# Patient Record
Sex: Female | Born: 1980 | Hispanic: No | Marital: Married
Health system: Midwestern US, Community
[De-identification: ages and names within clinical notes are randomized; demographics above are authoritative.]

## PROBLEM LIST (undated history)

## (undated) ENCOUNTER — Inpatient Hospital Stay (HOSPITAL_COMMUNITY): Payer: Self-pay

## (undated) DIAGNOSIS — Z5189 Encounter for other specified aftercare: Secondary | ICD-10-CM

## (undated) DIAGNOSIS — A159 Respiratory tuberculosis unspecified: Secondary | ICD-10-CM

## (undated) DIAGNOSIS — E559 Vitamin D deficiency, unspecified: Principal | ICD-10-CM

## (undated) DIAGNOSIS — R7989 Other specified abnormal findings of blood chemistry: Principal | ICD-10-CM

## (undated) HISTORY — DX: Encounter for other specified aftercare: Z51.89

## (undated) HISTORY — PX: DILATION AND CURETTAGE OF UTERUS: SHX78

## (undated) HISTORY — PX: NOSE SURGERY: SHX723

## (undated) HISTORY — DX: Respiratory tuberculosis unspecified: A15.9

---

## 2008-01-13 ENCOUNTER — Ambulatory Visit: Payer: Self-pay | Admitting: Family Medicine

## 2008-05-23 ENCOUNTER — Ambulatory Visit: Payer: Self-pay | Admitting: Internal Medicine

## 2008-05-23 ENCOUNTER — Encounter: Payer: Self-pay | Admitting: Family Medicine

## 2008-05-23 LAB — CONVERTED CEMR LAB
AST: 18 units/L (ref 0–37)
Albumin: 4.7 g/dL (ref 3.5–5.2)
Alkaline Phosphatase: 51 units/L (ref 39–117)
Basophils Absolute: 0.1 10*3/uL (ref 0.0–0.1)
Basophils Relative: 1 % (ref 0–1)
Calcium: 9.4 mg/dL (ref 8.4–10.5)
Eosinophils Absolute: 0.1 10*3/uL (ref 0.0–0.7)
Eosinophils Relative: 2 % (ref 0–5)
HCT: 40.2 % (ref 36.0–46.0)
Hemoglobin: 13.2 g/dL (ref 12.0–15.0)
MCV: 92.8 fL (ref 78.0–100.0)
Platelets: 261 10*3/uL (ref 150–400)
Potassium: 3.8 meq/L (ref 3.5–5.3)
RBC: 4.33 M/uL (ref 3.87–5.11)
RDW: 14.4 % (ref 11.5–15.5)
Sodium: 142 meq/L (ref 135–145)
Total Bilirubin: 0.3 mg/dL (ref 0.3–1.2)
WBC: 4.9 10*3/uL (ref 4.0–10.5)

## 2008-05-28 ENCOUNTER — Ambulatory Visit: Payer: Self-pay | Admitting: *Deleted

## 2009-08-27 ENCOUNTER — Ambulatory Visit (HOSPITAL_COMMUNITY): Admission: RE | Admit: 2009-08-27 | Discharge: 2009-08-27 | Payer: Self-pay | Admitting: Obstetrics & Gynecology

## 2009-12-20 ENCOUNTER — Inpatient Hospital Stay (HOSPITAL_COMMUNITY): Admission: AD | Admit: 2009-12-20 | Discharge: 2009-12-21 | Payer: Self-pay | Admitting: Obstetrics

## 2009-12-21 ENCOUNTER — Inpatient Hospital Stay (HOSPITAL_COMMUNITY): Admission: AD | Admit: 2009-12-21 | Discharge: 2009-12-24 | Payer: Self-pay | Admitting: Obstetrics

## 2010-11-23 ENCOUNTER — Encounter: Payer: Self-pay | Admitting: Obstetrics & Gynecology

## 2011-01-17 ENCOUNTER — Inpatient Hospital Stay (HOSPITAL_COMMUNITY)
Admission: AD | Admit: 2011-01-17 | Discharge: 2011-01-20 | DRG: 774 | Disposition: A | Discharge status: Home / Self Care | Payer: Medicaid Other | Source: Ambulatory Visit | Attending: Obstetrics and Gynecology | Admitting: Obstetrics and Gynecology

## 2011-01-17 DIAGNOSIS — D689 Coagulation defect, unspecified: Secondary | ICD-10-CM | POA: Diagnosis not present

## 2011-01-17 DIAGNOSIS — O99892 Other specified diseases and conditions complicating childbirth: Secondary | ICD-10-CM | POA: Diagnosis present

## 2011-01-17 DIAGNOSIS — O4290 Premature rupture of membranes, unspecified as to length of time between rupture and onset of labor, unspecified weeks of gestation: Principal | ICD-10-CM | POA: Diagnosis present

## 2011-01-17 DIAGNOSIS — Z22330 Carrier of Group B streptococcus: Secondary | ICD-10-CM

## 2011-01-17 DIAGNOSIS — D696 Thrombocytopenia, unspecified: Secondary | ICD-10-CM | POA: Diagnosis not present

## 2011-01-17 LAB — CBC
MCH: 29.5 pg (ref 26.0–34.0)
MCHC: 33.1 g/dL (ref 30.0–36.0)
MCV: 89.2 fL (ref 78.0–100.0)
RBC: 4.17 MIL/uL (ref 3.87–5.11)

## 2011-01-19 LAB — CBC
RBC: 3.78 MIL/uL — ABNORMAL LOW (ref 3.87–5.11)
WBC: 7.4 10*3/uL (ref 4.0–10.5)

## 2011-01-21 LAB — CBC
HCT: 39.5 % (ref 36.0–46.0)
Hemoglobin: 7.9 g/dL — ABNORMAL LOW (ref 12.0–15.0)
MCHC: 33.3 g/dL (ref 30.0–36.0)
MCV: 90.9 fL (ref 78.0–100.0)
MCV: 91.6 fL (ref 78.0–100.0)
Platelets: 160 10*3/uL (ref 150–400)
RBC: 2.6 MIL/uL — ABNORMAL LOW (ref 3.87–5.11)
RBC: 4.34 MIL/uL (ref 3.87–5.11)
RDW: 15.3 % (ref 11.5–15.5)
WBC: 13.3 10*3/uL — ABNORMAL HIGH (ref 4.0–10.5)

## 2011-01-21 LAB — RPR: RPR Ser Ql: NONREACTIVE

## 2011-01-25 ENCOUNTER — Other Ambulatory Visit: Payer: Self-pay | Admitting: Obstetrics and Gynecology

## 2011-01-25 ENCOUNTER — Ambulatory Visit (HOSPITAL_COMMUNITY): Payer: Medicaid Other

## 2011-01-25 ENCOUNTER — Inpatient Hospital Stay (HOSPITAL_COMMUNITY): Payer: Medicaid Other

## 2011-01-25 ENCOUNTER — Observation Stay (HOSPITAL_COMMUNITY)
Admission: AD | Admit: 2011-01-25 | Discharge: 2011-01-27 | DRG: 769 | Disposition: A | Discharge status: Home / Self Care | Payer: Medicaid Other | Source: Ambulatory Visit | Attending: Obstetrics and Gynecology | Admitting: Obstetrics and Gynecology

## 2011-01-25 DIAGNOSIS — R1031 Right lower quadrant pain: Secondary | ICD-10-CM | POA: Diagnosis present

## 2011-01-25 LAB — CBC
Hemoglobin: 11.8 g/dL — ABNORMAL LOW (ref 12.0–15.0)
MCH: 29.5 pg (ref 26.0–34.0)
MCV: 90.5 fL (ref 78.0–100.0)
Platelets: 179 10*3/uL (ref 150–400)
WBC: 4 10*3/uL (ref 4.0–10.5)

## 2011-01-25 LAB — URINALYSIS, ROUTINE W REFLEX MICROSCOPIC
Glucose, UA: NEGATIVE mg/dL
Ketones, ur: NEGATIVE mg/dL
Nitrite: NEGATIVE
Protein, ur: NEGATIVE mg/dL
Protein, ur: NEGATIVE mg/dL
Specific Gravity, Urine: 1.02 (ref 1.005–1.030)
Urobilinogen, UA: 0.2 mg/dL (ref 0.0–1.0)

## 2011-01-25 LAB — DIFFERENTIAL
Basophils Relative: 0 % (ref 0–1)
Eosinophils Absolute: 0.1 10*3/uL (ref 0.0–0.7)
Lymphs Abs: 1.4 10*3/uL (ref 0.7–4.0)
Monocytes Relative: 5 % (ref 3–12)
Neutro Abs: 5.3 10*3/uL (ref 1.7–7.7)

## 2011-01-25 LAB — URINE MICROSCOPIC-ADD ON

## 2011-01-26 LAB — CBC
Hemoglobin: 7.8 g/dL — ABNORMAL LOW (ref 12.0–15.0)
MCV: 90.2 fL (ref 78.0–100.0)
Platelets: 190 10*3/uL (ref 150–400)
RBC: 2.65 MIL/uL — ABNORMAL LOW (ref 3.87–5.11)
RDW: 14.4 % (ref 11.5–15.5)
WBC: 5.4 10*3/uL (ref 4.0–10.5)

## 2011-01-26 LAB — PREPARE RBC (CROSSMATCH)

## 2011-01-26 LAB — URINE CULTURE: Colony Count: 50000

## 2011-01-27 LAB — CROSSMATCH
Antibody Screen: NEGATIVE
Unit division: 0
Unit division: 0

## 2011-01-27 LAB — CBC
MCV: 88.2 fL (ref 78.0–100.0)
Platelets: 198 10*3/uL (ref 150–400)
RDW: 15.1 % (ref 11.5–15.5)

## 2011-01-29 NOTE — H&P (Signed)
  NAME:  Castaneda, Chelsea         ACCOUNT NO.:  [id number]  MEDICAL RECORD NO.:  [id number]           PATIENT TYPE:  O  LOCATION:  9306                          FACILITY:  [facility]  PHYSICIAN:  Osborn Coho, M.D.   DATE OF BIRTH:  07-Jan-1981  DATE OF ADMISSION:  01/25/2011 DATE OF DISCHARGE:                             HISTORY & PHYSICAL   The patient is a 30 year old gravida 2, para 2, one week status post normal spontaneous vaginal delivery of a term infant with no lacerations, no complications, reporting right lower quadrant pain that was mild to moderate since delivery but over the past 12-24 hours has significantly increased so much so that she has difficulty standing up and walking due to the pain.  She denies fever, chills, dysuria, frequency, heavy vaginal bleeding, malodorous vaginal bleeding.  She does report having difficulty starting a stream of urine, sometimes taking as long as 10 minutes to initiate urination and then taking a long time to empty her bladder.  ALLERGIES:  No known drug allergies.  PAST MEDICAL HISTORY:  No significant medical history.  FAMILY HISTORY:  No significant family history.  ALLERGIES:  No known drug allergies.  No latex allergy.  MEDICATIONS:  Ibuprofen.  OBJECTIVE:  VITAL SIGNS:  Stable, afebrile. GENERAL:  The patient appears mildly uncomfortable at rest, moderately uncomfortable with ambulation especially when bearing weight on her right leg. HEART:  Regular rate and rhythm. LUNGS:  Clear to auscultation bilaterally. ABDOMEN:  Soft, severely tender in the right lower quadrant, mildly tender in the left lower quadrant.  Positive bowel sounds x4. PELVIC:  BUS within normal limits.  No lacerations or hematomas.  Small amount of dark red blood in the vault with a normal odor of lochia. Cervix soft, slightly open.  No cervical motion tenderness or adnexal masses.  Difficult to assess size of uterus secondary to fundal tenderness.   Bladder scanner performed shows 587 mL prior to void.  The patient voided 100.  Postvoid residual was 487.  Foley catheter was placed and additional 100 drained. EXTREMITIES:  Lower extremities show normal strength and sensation bilaterally.  LABS: CBC shows white blood cell count of 7.2, hemoglobin of 11.8, hematocrit of 36.3, platelet count of 244.  No left shift.  Cath UA shows specific gravity of 1.010, moderate hemoglobin.  Microscopic shows rare bacteria. Abdominal ultrasound shows an enlarged postpartum uterus, heterogeneous echogenic endometrial contents with thickened endometrial cavity. Findings suggestive of retained products of conception.  ASSESSMENT: 1. One week status post normal spontaneous vaginal delivery. 2. Right lower quadrant pain, likely due to retained products of     conception and possible endometritis.  PLAN: 1. Discussed risk, benefits, and indications for D and C.  The patient     verbalizes understanding and wishes to proceed. 2. Antibiotics for suspected endometritis.  Dorathy Kinsman, CNM   ______________________________ Osborn Coho, M.D.    VS/MEDQ  D:  01/26/2011  T:  01/26/2011  Job:  [id number]  Electronically Signed by Dorathy Kinsman  on 01/27/2011 03:56:28 AM Electronically Signed by Osborn Coho M.D. on 01/29/2011 09:52:30 AM

## 2011-01-29 NOTE — Op Note (Signed)
  Chelsea Castaneda, Chelsea Castaneda         ACCOUNT NO.:  [id number]  MEDICAL RECORD NO.:  [id number]           PATIENT TYPE:  O  LOCATION:  9306                          [facility]ACILITY:  [facility]  PHYSICIAN:  Osborn Coho, M.D.   DATE OF BIRTH:  23-Feb-1981  DATE OF PROCEDURE:  01/25/2011 DATE OF DISCHARGE:                              OPERATIVE REPORT   PREOPERATIVE DIAGNOSIS:  Retained products of conception.  POSTOPERATIVE DIAGNOSIS:  Retained products of conception.  PROCEDURE:  Suction D and C under ultrasound guidance.  SURGEON:  Osborn Coho, MD  ANESTHESIA:  MAC and local.  FINDINGS:  Moderate POCs.  SPECIMENS TO PATHOLOGY:  POC.  FLUIDS:  2600 mL.  URINE OUTPUT:  200 mL.  ESTIMATED BLOOD LOSS:  800 mL.  COMPLICATIONS:  None.  PROCEDURE IN DETAIL:  The patient was taken to the operating room after risks, benefits, and alternatives discussed with the patient.  The patient verbalized understanding and consent signed and witnessed.  The patient was given a MAC per Anesthesia and prepped and draped in the normal sterile fashion.  A bivalve speculum was placed in the patient's vagina and the anterior lip of the cervix was grasped with a single- tooth tenaculum.  The uterus sounded to about 7 cm and a size 7 suction curette was used.  Suction curettage was performed.  Significant amount of bleeding was noted.  Sharp curettage was performed.  However a gritty texture was unable to be obtained and at that time, decision was made for ultrasound guidance.  There was significant amount of tissue at the fundus primarily in the left aspect of the fundus.  The larger Bier forceps were used to remove the tissue under ultrasound guidance and suction curettage was performed to try to remove any remaining debris and clot.  All instruments were removed.  There was a portion of the case where the tenaculum tore through the anterior lip of the cervix. However, there was no significant bleeding  at this area by the end of the case. Count was correct.  The patient tolerated the procedure well.  Cytotec at the end of the case 1000 mcg was placed in the patient's rectum.  The patient was awaiting return to the recovery room in good condition.     Osborn Coho, M.D.     AR/MEDQ  D:  01/25/2011  T:  01/26/2011  Job:  [id number]  Electronically Signed by Osborn Coho M.D. on 01/29/2011 09:52:14 AM

## 2011-03-12 NOTE — Discharge Summary (Signed)
NAMEJONIKA, Chelsea Castaneda         ACCOUNT NO.:  [id number]  MEDICAL RECORD NO.:  [id number]           PATIENT TYPE:  LOCATION:                                 FACILITY:  PHYSICIAN:  Osborn Coho, M.D.   DATE OF BIRTH:  [birth year]  DATE OF ADMISSION:  01/25/2011 DATE OF DISCHARGE:  01/27/2011                              DISCHARGE SUMMARY   DISCHARGE DIAGNOSIS:  Retained products of conception.  OPERATION:  On the date of admission, the patient underwent a dilatation and evacuation of her uterus because of retained products of conception. The patient tolerated the procedure well.  HISTORY OF PRESENT ILLNESS:  [name] is a 30 year old female para 2-0-0-2 presenting 1 week status post an uncomplicated spontaneous vaginal birth of a term infant complaining of pelvic pain (right greater than left).  She denied any fever, chills, dysuria, frequency, heavy vaginal bleeding, or vaginitis symptoms; however, she was having difficulty starting her stream of urine and ambulating due to her discomfort.  The patient was admitted for further evaluation.  ADMISSION PHYSICAL EXAMINATION:  Her vital signs were stable and she was afebrile.  Abdomen: soft, however, severely tender in the right lower quadrant and mildly so in the left lower quadrant.  The patient's bowel sounds were present in all four quadrants.  Pelvic exam: EG/BUS was normal.  There were no lacerations or hematomas.  There was a small amount of dark red blood in the vaginal vault with normal odor of lochia.  Her cervix was soft and slightly open.  There was no cervical motion tenderness or adnexal masses.  It was difficult to assess the size of the patient's uterus because of fundal tenderness.  A bladder scan showed approximately 587 mL of urine in her bladder prior to her voiding, and the patient was able to avoid 100 mL on her own with a postvoid residual of 487 mL.  A Foley catheter was subsequently placed and  an additional 100 mL of urine was drained.  The patient's extremities, in particular lower extremities, showed normal strength and sensation bilaterally.  The patient's admission CBC were essentially normal as was her urinalysis.  However, an abdominal ultrasound showed an enlarged postpartum uterus with heterogeneous echogenic endometrial contents with thickened endometrial cavity.  These findings were suggestive of retained products of conception.  HOSPITAL COURSE:  On the day of admission, the patient underwent aforementioned procedure, tolerated it well.  In spite of this, however, the patient did lose approximately 800 mL of blood and was transfused 2 units of packed red blood cells.  Subsequently, the patient continued to complain, however, of feeling very lightheaded, weak, and having difficulty ambulating in spite of the fact that her orthostatic blood pressures and pulses were within acceptable range.  The patient consented, therefore, to an additional transfusion of 1 unit of packed red blood cells and  felt much better.  Immediate postop hemoglobin and hematocrit were 8.4/25.9 (preop hemoglobin and hematocrit were 11.8/36.3).  The patient's hemoglobin/hematocrit fell as low as 7.8/23.9, however, following her third transfusion, her hemoglobin and hematocrit were 10.6/32.0.  By hospital day #3, the patient had received the maximum benefit  of her hospital stay, had good analgesia, and was, therefore, deemed ready for discharge home.  DISCHARGE MEDICATIONS:  The patient was directed to her home medication reconciliation form.  DISCHARGE INSTRUCTIONS:  The patient was given a copy of the [facility] D and C/D and E instruction sheet.  FOLLOWUP:  She is to call [facility] to schedule a followup visit in 2 weeks with Dr. Su Hilt.  DISCHARGE INSTRUCTIONS:  The patient was advised to continue the instructions given her following her delivery and to call with  any concerns.     Elmira J. Lowell Guitar, P.A.-C   ______________________________ Osborn Coho, M.D.    EJP/MEDQ  D:  03/03/2011  T:  03/04/2011  Job:  [id number]  Electronically Signed by Raylene Everts. on 03/09/2011 09:07:27 AM Electronically Signed by Osborn Coho M.D. on 03/12/2011 07:39:19 AM

## 2011-11-16 ENCOUNTER — Other Ambulatory Visit: Payer: Self-pay | Admitting: Obstetrics and Gynecology

## 2011-11-16 ENCOUNTER — Other Ambulatory Visit (HOSPITAL_COMMUNITY)
Admission: RE | Admit: 2011-11-16 | Discharge: 2011-11-16 | Disposition: A | Discharge status: Home / Self Care | Payer: BC Managed Care – PPO | Source: Ambulatory Visit | Attending: Obstetrics and Gynecology | Admitting: Obstetrics and Gynecology

## 2011-11-16 DIAGNOSIS — Z1159 Encounter for screening for other viral diseases: Secondary | ICD-10-CM | POA: Insufficient documentation

## 2011-11-16 DIAGNOSIS — Z124 Encounter for screening for malignant neoplasm of cervix: Secondary | ICD-10-CM | POA: Insufficient documentation

## 2011-11-16 DIAGNOSIS — Z113 Encounter for screening for infections with a predominantly sexual mode of transmission: Secondary | ICD-10-CM | POA: Insufficient documentation

## 2013-01-01 ENCOUNTER — Encounter (HOSPITAL_COMMUNITY): Payer: Self-pay | Admitting: *Deleted

## 2013-01-01 ENCOUNTER — Emergency Department (HOSPITAL_COMMUNITY)
Admission: EM | Admit: 2013-01-01 | Discharge: 2013-01-01 | Disposition: A | Discharge status: Home / Self Care | Payer: BC Managed Care – PPO | Attending: Emergency Medicine | Admitting: Emergency Medicine

## 2013-01-01 DIAGNOSIS — IMO0002 Reserved for concepts with insufficient information to code with codable children: Secondary | ICD-10-CM | POA: Insufficient documentation

## 2013-01-01 DIAGNOSIS — Y92410 Unspecified street and highway as the place of occurrence of the external cause: Secondary | ICD-10-CM | POA: Insufficient documentation

## 2013-01-01 DIAGNOSIS — Y9389 Activity, other specified: Secondary | ICD-10-CM | POA: Insufficient documentation

## 2013-01-01 DIAGNOSIS — M7918 Myalgia, other site: Secondary | ICD-10-CM

## 2013-01-01 MED ORDER — HYDROCODONE-ACETAMINOPHEN 5-325 MG PO TABS: ORAL_TABLET | ORAL | Status: DC

## 2013-01-01 MED ORDER — CYCLOBENZAPRINE HCL 10 MG PO TABS: 10.0000 mg | ORAL_TABLET | Freq: Two times a day (BID) | ORAL | Status: DC | PRN

## 2013-01-01 MED ORDER — OXYCODONE-ACETAMINOPHEN 5-325 MG PO TABS
2.0000 | ORAL_TABLET | Freq: Once | ORAL | Status: AC
  Administered 2013-01-01: 2 via ORAL
  Filled 2013-01-01: qty 2

## 2013-01-01 MED ORDER — TRAMADOL HCL 50 MG PO TABS: 50.0000 mg | ORAL_TABLET | Freq: Four times a day (QID) | ORAL | Status: DC | PRN

## 2013-01-01 MED ORDER — GABAPENTIN 100 MG PO CAPS: 100.0000 mg | ORAL_CAPSULE | Freq: Three times a day (TID) | ORAL | Status: DC

## 2013-01-01 MED ORDER — CYCLOBENZAPRINE HCL 10 MG PO TABS
10.0000 mg | ORAL_TABLET | Freq: Once | ORAL | Status: AC
  Administered 2013-01-01: 10 mg via ORAL
  Filled 2013-01-01: qty 1
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
## 2013-01-03 NOTE — ED Provider Notes (Signed)
Medical screening examination/treatment/procedure(s) were performed by non-physician practitioner and as supervising physician I was immediately available for consultation/collaboration.  Martha K Linker, MD 01/03/13 0659 

## 2013-01-26 ENCOUNTER — Ambulatory Visit: Payer: BC Managed Care – PPO

## 2013-01-26 ENCOUNTER — Ambulatory Visit (INDEPENDENT_AMBULATORY_CARE_PROVIDER_SITE_OTHER): Payer: BC Managed Care – PPO | Admitting: Internal Medicine

## 2013-01-26 VITALS — BP 108/72 | HR 74 | Temp 98.4°F | Resp 16 | Ht 63.0 in | Wt 152.0 lb

## 2013-01-26 DIAGNOSIS — R1012 Left upper quadrant pain: Secondary | ICD-10-CM

## 2013-01-26 DIAGNOSIS — N390 Urinary tract infection, site not specified: Secondary | ICD-10-CM

## 2013-01-26 DIAGNOSIS — Z8611 Personal history of tuberculosis: Secondary | ICD-10-CM

## 2013-01-26 DIAGNOSIS — R300 Dysuria: Secondary | ICD-10-CM

## 2013-01-26 DIAGNOSIS — M542 Cervicalgia: Secondary | ICD-10-CM

## 2013-01-26 LAB — POCT UA - MICROSCOPIC ONLY
Casts, Ur, LPF, POC: NEGATIVE
Crystals, Ur, HPF, POC: NEGATIVE
Mucus, UA: POSITIVE
Yeast, UA: POSITIVE

## 2013-01-26 LAB — POCT CBC
Granulocyte percent: 52.3 % (ref 37–80)
HCT, POC: 43.7 % (ref 37.7–47.9)
Hemoglobin: 14.1 g/dL (ref 12.2–16.2)
Lymph, poc: 1.8 (ref 0.6–3.4)
MCH, POC: 30.8 pg (ref 27–31.2)
MCHC: 32.3 g/dL (ref 31.8–35.4)
MCV: 95.5 fL (ref 80–97)
MID (cbc): 0.3 (ref 0–0.9)
MPV: 10.5 fL (ref 0–99.8)
POC Granulocyte: 2.3 (ref 2–6.9)
POC LYMPH PERCENT: 39.8 % (ref 10–50)
POC MID %: 7.9 % (ref 0–12)
Platelet Count, POC: 243 K/uL (ref 142–424)
RBC: 4.58 M/uL (ref 4.04–5.48)
RDW, POC: 14.4 %
WBC: 4.4 K/uL — AB (ref 4.6–10.2)

## 2013-01-26 LAB — POCT SEDIMENTATION RATE: POCT SED RATE: 12 mm/h (ref 0–22)

## 2013-01-26 LAB — POCT URINALYSIS DIPSTICK
Bilirubin, UA: NEGATIVE
Nitrite, UA: NEGATIVE
pH, UA: 5.5

## 2013-01-26 MED ORDER — CIPROFLOXACIN HCL 500 MG PO TABS: 500.0000 mg | ORAL_TABLET | Freq: Two times a day (BID) | ORAL | Status: DC

## 2013-01-26 MED ORDER — IBUPROFEN 600 MG PO TABS: 600.0000 mg | ORAL_TABLET | Freq: Three times a day (TID) | ORAL | Status: DC | PRN

## 2013-01-26 NOTE — Patient Instructions (Signed)
Cervical Sprain A cervical sprain is an injury in the neck in which the ligaments are stretched or torn. The ligaments are the tissues that hold the bones of the neck (vertebrae) in place.Cervical sprains can range from very mild to very severe. Most cervical sprains get better in 1 to 3 weeks, but it depends on the cause and extent of the injury. Severe cervical sprains can cause the neck vertebrae to be unstable. This can lead to damage of the spinal cord and can result in serious nervous system problems. Your caregiver will determine whether your cervical sprain is mild or severe. CAUSES  Severe cervical sprains may be caused by:  Contact sport injuries (football, rugby, wrestling, hockey, auto racing, gymnastics, diving, martial arts, boxing).  Motor vehicle collisions.  Whiplash injuries. This means the neck is forcefully whipped backward and forward.  Falls. Mild cervical sprains may be caused by:   Awkward positions, such as cradling a telephone between your ear and shoulder.  Sitting in a chair that does not offer proper support.  Working at a poorly designed computer station.  Activities that require looking up or down for long periods of time. SYMPTOMS   Pain, soreness, stiffness, or a burning sensation in the front, back, or sides of the neck. This discomfort may develop immediately after injury or it may develop slowly and not begin for 24 hours or more after an injury.  Pain or tenderness directly in the middle of the back of the neck.  Shoulder or upper back pain.  Limited ability to move the neck.  Headache.  Dizziness.  Weakness, numbness, or tingling in the hands or arms.  Muscle spasms.  Difficulty swallowing or chewing.  Tenderness and swelling of the neck. DIAGNOSIS  Most of the time, your caregiver can diagnose this problem by taking your history and doing a physical exam. Your caregiver will ask about any known problems, such as arthritis in the neck  or a previous neck injury. X-rays may be taken to find out if there are any other problems, such as problems with the bones of the neck. However, an X-ray often does not reveal the full extent of a cervical sprain. Other tests such as a computed tomography (CT) scan or magnetic resonance imaging (MRI) may be needed. TREATMENT  Treatment depends on the severity of the cervical sprain. Mild sprains can be treated with rest, keeping the neck in place (immobilization), and pain medicines. Severe cervical sprains need immediate immobilization and an appointment with an orthopedist or neurosurgeon. Several treatment options are available to help with pain, muscle spasms, and other symptoms. Your caregiver may prescribe:  Medicines, such as pain relievers, numbing medicines, or muscle relaxants.  Physical therapy. This can include stretching exercises, strengthening exercises, and posture training. Exercises and improved posture can help stabilize the neck, strengthen muscles, and help stop symptoms from returning.  A neck collar to be worn for short periods of time. Often, these collars are worn for comfort. However, certain collars may be worn to protect the neck and prevent further worsening of a serious cervical sprain. HOME CARE INSTRUCTIONS   Put ice on the injured area.  Put ice in a plastic bag.  Place a towel between your skin and the bag.  Leave the ice on for 15 to 20 minutes, 3 to 4 times a day.  Only take over-the-counter or prescription medicines for pain, discomfort, or fever as directed by your caregiver.  Keep all follow-up appointments as directed by your   caregiver.  Keep all physical therapy appointments as directed by your caregiver.  If a neck collar is prescribed, wear it as directed by your caregiver.  Do not drive while wearing a neck collar.  Make any needed adjustments to your work station to promote good posture.  Avoid positions and activities that make your  symptoms worse.  Warm up and stretch before being active to help prevent problems. SEEK MEDICAL CARE IF:   Your pain is not controlled with medicine.  You are unable to decrease your pain medicine over time as planned.  Your activity level is not improving as expected. SEEK IMMEDIATE MEDICAL CARE IF:   You develop any bleeding, stomach upset, or signs of an allergic reaction to your medicine.  Your symptoms get worse.  You develop new, unexplained symptoms.  You have numbness, tingling, weakness, or paralysis in any part of your body. MAKE SURE YOU:   Understand these instructions.  Will watch your condition.  Will get help right away if you are not doing well or get worse. Document Released: 08/16/2007 Document Revised: 01/11/2012 Document Reviewed: 07/22/2011 [facility] Patient Information 2013 [place], [place]. Urinary Tract Infection Urinary tract infections (UTIs) can develop anywhere along your urinary tract. Your urinary tract is your body's drainage system for removing wastes and extra water. Your urinary tract includes two kidneys, two ureters, a bladder, and a urethra. Your kidneys are a pair of bean-shaped organs. Each kidney is about the size of your fist. They are located below your ribs, one on each side of your spine. CAUSES Infections are caused by microbes, which are microscopic organisms, including fungi, viruses, and bacteria. These organisms are so small that they can only be seen through a microscope. Bacteria are the microbes that most commonly cause UTIs. SYMPTOMS  Symptoms of UTIs may vary by age and gender of the patient and by the location of the infection. Symptoms in young women typically include a frequent and intense urge to urinate and a painful, burning feeling in the bladder or urethra during urination. Older women and men are more likely to be tired, shaky, and weak and have muscle aches and abdominal pain. A fever may mean the infection is in your  kidneys. Other symptoms of a kidney infection include pain in your back or sides below the ribs, nausea, and vomiting. DIAGNOSIS To diagnose a UTI, your caregiver will ask you about your symptoms. Your caregiver also will ask to provide a urine sample. The urine sample will be tested for bacteria and white blood cells. White blood cells are made by your body to help fight infection. TREATMENT  Typically, UTIs can be treated with medication. Because most UTIs are caused by a bacterial infection, they usually can be treated with the use of antibiotics. The choice of antibiotic and length of treatment depend on your symptoms and the type of bacteria causing your infection. HOME CARE INSTRUCTIONS  If you were prescribed antibiotics, take them exactly as your caregiver instructs you. Finish the medication even if you feel better after you have only taken some of the medication.  Drink enough water and fluids to keep your urine clear or pale yellow.  Avoid caffeine, tea, and carbonated beverages. They tend to irritate your bladder.  Empty your bladder often. Avoid holding urine for long periods of time.  Empty your bladder before and after sexual intercourse.  After a bowel movement, women should cleanse from front to back. Use each tissue only once. SEEK MEDICAL CARE IF:  You have back pain.  You develop a fever.  Your symptoms do not begin to resolve within 3 days. SEEK IMMEDIATE MEDICAL CARE IF:   You have severe back pain or lower abdominal pain.  You develop chills.  You have nausea or vomiting.  You have continued burning or discomfort with urination. MAKE SURE YOU:   Understand these instructions.  Will watch your condition.  Will get help right away if you are not doing well or get worse. Document Released: 07/29/2005 Document Revised: 04/19/2012 Document Reviewed: 11/27/2011 [facility] Patient Information 2013 [place], [place].

## 2013-01-26 NOTE — Progress Notes (Signed)
Subjective:    Patient ID: Chelsea Castaneda, female    DOB: 1981/05/24, 32 y.o.   MRN: [id number]  HPI CO 5d of anterior and posterior neck pain, no throat pain, nofever or resp sxs, no cough or chest pain , but does have left quad abdom pain. No diarrhea, constipation, nausea. No fatigue,Malaise. No dysuria, hematuria. No HA, dizzy,radiation of pain  Review of Systems Generally healthy from [facility], house wife    Objective:   Physical Exam  Vitals reviewed. Constitutional: She is oriented to person, place, and time. She appears well-developed and well-nourished.  HENT:  Right Ear: External ear normal.  Left Ear: External ear normal.  Nose: Nose normal.  Mouth/Throat: Oropharynx is clear and moist.  Eyes: EOM are normal. No scleral icterus.  Neck: No tracheal deviation present. No thyromegaly present.  Cardiovascular: Normal rate, regular rhythm and normal heart sounds.   Pulmonary/Chest: Effort normal and breath sounds normal.  Abdominal: Soft. Bowel sounds are normal. She exhibits no mass. There is tenderness. There is no rebound and no guarding.  Musculoskeletal: She exhibits tenderness.       Cervical back: She exhibits decreased range of motion, tenderness and pain. She exhibits no bony tenderness, no swelling, no edema, no deformity, no laceration, no spasm and normal pulse.       Back:  Lymphadenopathy:    She has no cervical adenopathy.  Neurological: She is alert and oriented to person, place, and time. She has normal strength. No cranial nerve deficit or sensory deficit. She exhibits normal muscle tone. She displays a negative Romberg sign. Coordination and gait normal.  Skin: Skin is warm, dry and intact. No erythema.  Tender thyroid Painfull to move neck/ no nuchal signs because can passively move neck and no HA  Results for orders placed in visit on 01/26/13  POCT UA - MICROSCOPIC ONLY      Result Value Range   WBC, Ur, HPF, POC 3-5     RBC, urine, microscopic  2-3     Bacteria, U Microscopic 1+     Mucus, UA pos     Epithelial cells, urine per micros 2-3     Crystals, Ur, HPF, POC neg     Casts, Ur, LPF, POC neg     Yeast, UA pos    POCT URINALYSIS DIPSTICK      Result Value Range   Color, UA yellow     Clarity, UA clear     Glucose, UA neg     Bilirubin, UA neg     Ketones, UA neg     Spec Grav, UA 1.025     Blood, UA trace     pH, UA 5.5     Protein, UA neg     Urobilinogen, UA 0.2     Nitrite, UA neg     Leukocytes, UA Trace    POCT CBC      Result Value Range   WBC 4.4 (*) 4.6 - 10.2 K/uL   Lymph, poc 1.8  0.6 - 3.4   POC LYMPH PERCENT 39.8  10 - 50 %L   MID (cbc) 0.3  0 - 0.9   POC MID % 7.9  0 - 12 %M   POC Granulocyte 2.3  2 - 6.9   Granulocyte percent 52.3  37 - 80 %G   RBC 4.58  4.04 - 5.48 M/uL   Hemoglobin 14.1  12.2 - 16.2 g/dL   HCT, POC 40.9  81.1 - 47.9 %  MCV 95.5  80 - 97 fL   MCH, POC 30.8  27 - 31.2 pg   MCHC 32.3  31.8 - 35.4 g/dL   RDW, POC 16.1     Platelet Count, POC 243  142 - 424 K/uL   MPV 10.5  0 - 99.8 fL    Cs urine     Assessment & Plan:  UTI likely Neck pain cause unclear Cipro/Motrin Close f/up

## 2013-01-27 NOTE — Progress Notes (Signed)
  Subjective:    Patient ID: Chelsea Castaneda, female    DOB: April 22, 1981, 32 y.o.   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 Has neck and upper thorax pain today. CXR normal        Assessment & Plan:

## 2013-01-28 LAB — URINE CULTURE: Colony Count: 30000

## 2013-02-16 ENCOUNTER — Other Ambulatory Visit: Payer: Self-pay | Admitting: Obstetrics and Gynecology

## 2013-02-16 ENCOUNTER — Other Ambulatory Visit (HOSPITAL_COMMUNITY)
Admission: RE | Admit: 2013-02-16 | Discharge: 2013-02-16 | Disposition: A | Discharge status: Home / Self Care | Payer: BC Managed Care – PPO | Source: Ambulatory Visit | Attending: Obstetrics and Gynecology | Admitting: Obstetrics and Gynecology

## 2013-02-16 DIAGNOSIS — Z01419 Encounter for gynecological examination (general) (routine) without abnormal findings: Secondary | ICD-10-CM | POA: Insufficient documentation

## 2013-05-03 ENCOUNTER — Emergency Department (HOSPITAL_COMMUNITY)
Admission: EM | Admit: 2013-05-03 | Discharge: 2013-05-03 | Disposition: A | Discharge status: Home / Self Care | Payer: BC Managed Care – PPO | Attending: Emergency Medicine | Admitting: Emergency Medicine

## 2013-05-03 ENCOUNTER — Encounter (HOSPITAL_COMMUNITY): Payer: Self-pay | Admitting: Emergency Medicine

## 2013-05-03 DIAGNOSIS — R5381 Other malaise: Secondary | ICD-10-CM | POA: Insufficient documentation

## 2013-05-03 DIAGNOSIS — Z3490 Encounter for supervision of normal pregnancy, unspecified, unspecified trimester: Secondary | ICD-10-CM

## 2013-05-03 DIAGNOSIS — Z8611 Personal history of tuberculosis: Secondary | ICD-10-CM | POA: Insufficient documentation

## 2013-05-03 DIAGNOSIS — F329 Major depressive disorder, single episode, unspecified: Secondary | ICD-10-CM | POA: Insufficient documentation

## 2013-05-03 DIAGNOSIS — O9989 Other specified diseases and conditions complicating pregnancy, childbirth and the puerperium: Secondary | ICD-10-CM | POA: Insufficient documentation

## 2013-05-03 DIAGNOSIS — R0602 Shortness of breath: Secondary | ICD-10-CM | POA: Insufficient documentation

## 2013-05-03 DIAGNOSIS — F3289 Other specified depressive episodes: Secondary | ICD-10-CM | POA: Insufficient documentation

## 2013-05-03 DIAGNOSIS — Z3201 Encounter for pregnancy test, result positive: Secondary | ICD-10-CM | POA: Insufficient documentation

## 2013-05-03 DIAGNOSIS — R531 Weakness: Secondary | ICD-10-CM

## 2013-05-03 LAB — POCT PREGNANCY, URINE: Preg Test, Ur: POSITIVE — AB

## 2013-05-03 LAB — CBC WITH DIFFERENTIAL/PLATELET
Basophils Absolute: 0 10*3/uL (ref 0.0–0.1)
Basophils Relative: 1 % (ref 0–1)
Eosinophils Relative: 2 % (ref 0–5)
HCT: 36.9 % (ref 36.0–46.0)
MCHC: 34.1 g/dL (ref 30.0–36.0)
MCV: 90.2 fL (ref 78.0–100.0)
Monocytes Absolute: 0.5 10*3/uL (ref 0.1–1.0)
RDW: 13.1 % (ref 11.5–15.5)

## 2013-05-03 LAB — URINALYSIS, ROUTINE W REFLEX MICROSCOPIC
Hgb urine dipstick: NEGATIVE
Specific Gravity, Urine: 1.031 — ABNORMAL HIGH (ref 1.005–1.030)
Urobilinogen, UA: 0.2 mg/dL (ref 0.0–1.0)

## 2013-05-03 LAB — COMPREHENSIVE METABOLIC PANEL
AST: 13 U/L (ref 0–37)
Albumin: 3.7 g/dL (ref 3.5–5.2)
CO2: 25 mEq/L (ref 19–32)
Calcium: 9.7 mg/dL (ref 8.4–10.5)
Creatinine, Ser: 0.61 mg/dL (ref 0.50–1.10)
GFR calc non Af Amer: >90 mL/min (ref >90)

## 2013-05-03 LAB — POCT I-STAT TROPONIN I: Troponin i, poc: 0.01 ng/mL (ref 0.00–0.08)

## 2013-05-03 LAB — URINE MICROSCOPIC-ADD ON

## 2013-05-03 NOTE — ED Notes (Signed)
Per pt, states these symptoms started once she got pregnant-feels sad, unable to get out of bed-did not feel this way with other pregnancies-no history of post partum depression

## 2013-05-03 NOTE — ED Provider Notes (Signed)
History    CSN: [id number] Arrival date & time 05/03/13  1256  First MD Initiated Contact with Patient 05/03/13 1436     Chief Complaint  Patient presents with  . Shortness of Breath   (Consider location/radiation/quality/duration/timing/severity/associated sxs/prior Treatment) HPI  32 year old female G3P2 who is currently [redacted] weeks pregnant presents complaining of feeling tired and depressed. Patient reports for the past 3 weeks she has sensation of fatigue, dizzy, occasional shortness of breath, lack of energy to do anything. 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 She wants to return to [facility] be with family. This morning she felt as if her heart is going to stop because it was beating so slow, thus prompted her to come to the ER for further evaluation. Patient denies fever, chills, headache, chest pain, abdominal pain, back pain, nausea, vomiting, diarrhea, dysuria, or rash. She has no suicidal or homicidal ideation. She does not self medicate with anything. She has not had recent symptoms from prior pregnancy. This is an expected pregnancy. She has no cardiac history. She does not have a new OB/GYN. Patient currently has no complaint of any associated pain. Denies sneezing, cough, sore throat, all of hemoptysis. No prior history of PE or DVT.  No complaint of shortness of breath at this time. She is a nonsmoker.   Past Medical History  Diagnosis Date  . Tuberculosis     inactive   History reviewed. No pertinent past surgical history. Family History  Problem Relation Age of Onset  . Diabetes Mother   . Hypertension Mother    History  Substance Use Topics  . Smoking status: Never Smoker   . Smokeless tobacco: Never Used  . Alcohol Use: No   OB History   Grav Para Term Preterm Abortions TAB SAB Ect Mult Living   1              Review of Systems  All other systems reviewed and are negative.    Allergies  Review of  patient's allergies indicates no known allergies.  Home Medications   Current Outpatient Rx  Name  Route  Sig  Dispense  Refill  . Prenatal Vit-Fe Fumarate-FA (PRENATAL MULTIVITAMIN) TABS   Oral   Take 1 tablet by mouth every morning.          BP 133/90  Pulse 84  Temp(Src) 98.6 F (37 C) (Oral)  Resp 18  SpO2 100% Physical Exam  Nursing note and vitals reviewed. Constitutional: She is oriented to person, place, and time. She appears well-developed and well-nourished. No distress.  Awake, alert, nontoxic appearance  HENT:  Head: Atraumatic.  Mouth/Throat: Oropharynx is clear and moist.  Eyes: Conjunctivae are normal. Right eye exhibits no discharge. Left eye exhibits no discharge.  Neck: Neck supple.  Cardiovascular: Normal rate and regular rhythm.   Pulmonary/Chest: Effort normal. No respiratory distress. She exhibits no tenderness.  Abdominal: Soft. There is no tenderness. There is no rebound.  Musculoskeletal: She exhibits no tenderness.  ROM appears intact, no obvious focal weakness  Neurological: She is alert and oriented to person, place, and time. She has normal strength. No cranial nerve deficit or sensory deficit. GCS eye subscore is 4. GCS verbal subscore is 5. GCS motor subscore is 6.  Mental status and motor strength appears intact  5 out of 5 strength to all 4 extremities. Normal gait.  No pedal edema.  Skin: No rash noted.  Psychiatric: She  has a normal mood and affect.    ED Course  Procedures (including critical care time)   Date: 05/03/2013  Rate: 77  Rhythm: normal sinus rhythm  QRS Axis: normal  Intervals: normal  ST/T Wave abnormalities: normal  Conduction Disutrbances: none  Narrative Interpretation:   Old EKG Reviewed: No prior for comparison.     2:51 PM Pt has sxs suggestive of partum depression.  NO SI/HI.  ECG unremarkable.  No abdominal pain.  Labs are otherwise unremarkable.  Doubt UTI.  Able to ambulate without difficulty.  No  focal neuro deficits.  Care discussed with attending.  Plan to have pt f/u with OB/GYN for further care.  Reassurance given.  Return precaution discussed.    Labs Reviewed  COMPREHENSIVE METABOLIC PANEL - Abnormal; Notable for the following:    Total Bilirubin 0.2 (*)    All other components within normal limits  URINALYSIS, ROUTINE W REFLEX MICROSCOPIC - Abnormal; Notable for the following:    APPearance CLOUDY (*)    Specific Gravity, Urine 1.031 (*)    Leukocytes, UA MODERATE (*)    All other components within normal limits  URINE MICROSCOPIC-ADD ON - Abnormal; Notable for the following:    Squamous Epithelial / LPF FEW (*)    Bacteria, UA FEW (*)    All other components within normal limits  POCT PREGNANCY, URINE - Abnormal; Notable for the following:    Preg Test, Ur POSITIVE (*)    All other components within normal limits  URINE CULTURE  CBC WITH DIFFERENTIAL  POCT I-STAT TROPONIN I   No results found. 1. Weakness generalized   2. Pregnancy     MDM  BP 133/90  Pulse 84  Temp(Src) 98.6 F (37 C) (Oral)  Resp 18  SpO2 100%  I have reviewed nursing notes and vital signs.  I reviewed available ER/hospitalization records thought the EMR   Fayrene Helper, [place] 05/03/13 1544

## 2013-05-03 NOTE — ED Notes (Signed)
Per pt, 4-[redacted] weeks pregnant-has been short of breath, weak, dizzy on and off for 3 weeks-worse today-states feels like her heart is beating slow and feels like it is going to stop

## 2013-05-03 NOTE — ED Provider Notes (Signed)
Medical screening examination/treatment/procedure(s) were performed by non-physician practitioner and as supervising physician I was immediately available for consultation/collaboration.  Ethelda Chick, MD 05/03/13 [phone]

## 2013-05-04 LAB — URINE CULTURE: Colony Count: 60000

## 2013-05-07 ENCOUNTER — Inpatient Hospital Stay (HOSPITAL_COMMUNITY): Payer: BC Managed Care – PPO

## 2013-05-07 ENCOUNTER — Encounter (HOSPITAL_COMMUNITY): Payer: Self-pay | Admitting: *Deleted

## 2013-05-07 ENCOUNTER — Inpatient Hospital Stay (HOSPITAL_COMMUNITY)
Admission: AD | Admit: 2013-05-07 | Discharge: 2013-05-07 | Disposition: A | Discharge status: Home / Self Care | Payer: BC Managed Care – PPO | Source: Ambulatory Visit | Attending: Obstetrics & Gynecology | Admitting: Obstetrics & Gynecology

## 2013-05-07 DIAGNOSIS — O219 Vomiting of pregnancy, unspecified: Secondary | ICD-10-CM

## 2013-05-07 DIAGNOSIS — N390 Urinary tract infection, site not specified: Secondary | ICD-10-CM | POA: Insufficient documentation

## 2013-05-07 DIAGNOSIS — O2390 Unspecified genitourinary tract infection in pregnancy, unspecified trimester: Secondary | ICD-10-CM | POA: Insufficient documentation

## 2013-05-07 DIAGNOSIS — O210 Mild hyperemesis gravidarum: Secondary | ICD-10-CM | POA: Insufficient documentation

## 2013-05-07 LAB — CBC
HCT: 36.4 % (ref 36.0–46.0)
Hemoglobin: 12.5 g/dL (ref 12.0–15.0)
MCH: 30.6 pg (ref 26.0–34.0)
MCHC: 34.3 g/dL (ref 30.0–36.0)
MCV: 89 fL (ref 78.0–100.0)
Platelets: 201 10*3/uL (ref 150–400)
RBC: 4.09 MIL/uL (ref 3.87–5.11)
RDW: 13.2 % (ref 11.5–15.5)
WBC: 7.2 10*3/uL (ref 4.0–10.5)

## 2013-05-07 LAB — COMPREHENSIVE METABOLIC PANEL
ALT: 10 U/L (ref 0–35)
AST: 13 U/L (ref 0–37)
Albumin: 3.7 g/dL (ref 3.5–5.2)
Alkaline Phosphatase: 44 U/L (ref 39–117)
BUN: 10 mg/dL (ref 6–23)
CO2: 26 mEq/L (ref 19–32)
Calcium: 9.6 mg/dL (ref 8.4–10.5)
Chloride: 99 mEq/L (ref 96–112)
Creatinine, Ser: 0.61 mg/dL (ref 0.50–1.10)
GFR calc Af Amer: >90 mL/min (ref >90)
GFR calc non Af Amer: >90 mL/min (ref >90)
Glucose, Bld: 99 mg/dL (ref 70–99)
Potassium: 4 mEq/L (ref 3.5–5.1)
Sodium: 134 mEq/L — ABNORMAL LOW (ref 135–145)
Total Bilirubin: 0.1 mg/dL — ABNORMAL LOW (ref 0.3–1.2)
Total Protein: 7.2 g/dL (ref 6.0–8.3)

## 2013-05-07 LAB — URINALYSIS, ROUTINE W REFLEX MICROSCOPIC
Bilirubin Urine: NEGATIVE
Glucose, UA: NEGATIVE mg/dL
Hgb urine dipstick: NEGATIVE
Ketones, ur: 15 mg/dL — AB
Nitrite: NEGATIVE
Protein, ur: NEGATIVE mg/dL
Specific Gravity, Urine: >1.03 — ABNORMAL HIGH (ref 1.005–1.030)
Urobilinogen, UA: 0.2 mg/dL (ref 0.0–1.0)
pH: 6 (ref 5.0–8.0)

## 2013-05-07 LAB — WET PREP, GENITAL
Clue Cells Wet Prep HPF POC: NONE SEEN
Trich, Wet Prep: NONE SEEN
Yeast Wet Prep HPF POC: NONE SEEN

## 2013-05-07 LAB — HCG, QUANTITATIVE, PREGNANCY: hCG, Beta Chain, Quant, S: 71268 m[IU]/mL — ABNORMAL HIGH (ref <5)

## 2013-05-07 LAB — URINE MICROSCOPIC-ADD ON

## 2013-05-07 LAB — OB RESULTS CONSOLE GC/CHLAMYDIA
CHLAMYDIA, DNA PROBE: NEGATIVE
Gonorrhea: NEGATIVE

## 2013-05-07 MED ORDER — M.V.I. ADULT IV INJ
Freq: Once | INTRAVENOUS | Status: AC
  Administered 2013-05-07: 19:00:00 via INTRAVENOUS
  Filled 2013-05-07: qty 1000

## 2013-05-07 MED ORDER — CEPHALEXIN 500 MG PO CAPS: 500.0000 mg | ORAL_CAPSULE | Freq: Three times a day (TID) | ORAL | Status: DC

## 2013-05-07 MED ORDER — DOXYLAMINE-PYRIDOXINE 10-10 MG PO TBEC: 1.0000 | DELAYED_RELEASE_TABLET | ORAL | Status: DC

## 2013-05-07 NOTE — MAU Provider Note (Signed)
History     CSN: [id number]  Arrival date and time: 05/07/13 1538   First Provider Initiated Contact with Patient 05/07/13 1601      Chief Complaint  Patient presents with  . Nausea  . Shortness of Breath  . Abdominal Cramping   HPI Chelsea Castaneda is a 32 y.o. G3P2002 at [redacted]w[redacted]d. She presents with not feeling well. She is very tired and nauseated, has not eaten a meal in 4 wks, only drinks water, had tea this am. She can't even go outside with her children to play. She denies any change in discharge or bleeding, has low abd pain- cramping- before having BM's. She feels like her heart is slower than usual. She had ED visit 7/2,   CBC, C Met and Troponin were nl. OB History   Grav Para Term Preterm Abortions TAB SAB Ect Mult Living   3 2 2       2       Past Medical History  Diagnosis Date  . Tuberculosis     inactive    Past Surgical History  Procedure Laterality Date  . Cervical cerclage      Family History  Problem Relation Age of Onset  . Diabetes Mother   . Hypertension Mother     History  Substance Use Topics  . Smoking status: Never Smoker   . Smokeless tobacco: Never Used  . Alcohol Use: No    Allergies: No Known Allergies  Prescriptions prior to admission  Medication Sig Dispense Refill  . Prenatal Vit-Fe Fumarate-FA (PRENATAL MULTIVITAMIN) TABS Take 1 tablet by mouth every morning.        ROS Physical Exam   Blood pressure 112/69, pulse 85, temperature 97.2 F (36.2 C), temperature source Axillary, resp. rate 16, height 5\' 6"  (1.676 m), weight 172 lb (78.019 kg), SpO2 100.00%.  Physical Exam  Constitutional: She is oriented to person, place, and time. She appears well-developed and well-nourished.  GI: Soft. There is no tenderness.  Musculoskeletal: Normal range of motion.  Neurological: She is alert and oriented to person, place, and time.  Skin: Skin is warm and dry.  Psychiatric: She has a normal mood and affect. Her behavior is normal.     MAU Course  Procedures  MDM Results for orders placed during the hospital encounter of 05/07/13 (from the past 24 hour(s))  URINALYSIS, ROUTINE W REFLEX MICROSCOPIC     Status: Abnormal   Collection Time    05/07/13  4:30 PM      Result Value Range   Color, Urine YELLOW  YELLOW   APPearance HAZY (*) CLEAR   Specific Gravity, Urine >1.030 (*) 1.005 - 1.030   pH 6.0  5.0 - 8.0   Glucose, UA NEGATIVE  NEGATIVE mg/dL   Hgb urine dipstick NEGATIVE  NEGATIVE   Bilirubin Urine NEGATIVE  NEGATIVE   Ketones, ur 15 (*) NEGATIVE mg/dL   Protein, ur NEGATIVE  NEGATIVE mg/dL   Urobilinogen, UA 0.2  0.0 - 1.0 mg/dL   Nitrite NEGATIVE  NEGATIVE   Leukocytes, UA TRACE (*) NEGATIVE  URINE MICROSCOPIC-ADD ON     Status: Abnormal   Collection Time    05/07/13  4:30 PM      Result Value Range   Squamous Epithelial / LPF MANY (*) RARE   WBC, UA 3-6  <3 WBC/hpf   RBC / HPF 0-2  <3 RBC/hpf   Bacteria, UA MANY (*) RARE   Crystals CA OXALATE CRYSTALS (*) NEGATIVE  CBC  Status: None   Collection Time    05/07/13  4:45 PM      Result Value Range   WBC 7.2  4.0 - 10.5 K/uL   RBC 4.09  3.87 - 5.11 MIL/uL   Hemoglobin 12.5  12.0 - 15.0 g/dL   HCT 91.4  78.2 - 95.6 %   MCV 89.0  78.0 - 100.0 fL   MCH 30.6  26.0 - 34.0 pg   MCHC 34.3  30.0 - 36.0 g/dL   RDW 21.3  08.6 - 57.8 %   Platelets 201  150 - 400 K/uL  COMPREHENSIVE METABOLIC PANEL     Status: Abnormal   Collection Time    05/07/13  4:45 PM      Result Value Range   Sodium 134 (*) 135 - 145 mEq/L   Potassium 4.0  3.5 - 5.1 mEq/L   Chloride 99  96 - 112 mEq/L   CO2 26  19 - 32 mEq/L   Glucose, Bld 99  70 - 99 mg/dL   BUN 10  6 - 23 mg/dL   Creatinine, Ser 4.69  0.50 - 1.10 mg/dL   Calcium 9.6  8.4 - 62.9 mg/dL   Total Protein 7.2  6.0 - 8.3 g/dL   Albumin 3.7  3.5 - 5.2 g/dL   AST 13  0 - 37 U/L   ALT 10  0 - 35 U/L   Alkaline Phosphatase 44  39 - 117 U/L   Total Bilirubin 0.1 (*) 0.3 - 1.2 mg/dL   GFR calc non Af Amer >90   >90 mL/min   GFR calc Af Amer >90  >90 mL/min  HCG, QUANTITATIVE, PREGNANCY     Status: Abnormal   Collection Time    05/07/13  4:45 PM      Result Value Range   hCG, Beta Chain, Quant, [place] [address] (*) <5 mIU/mL  WET PREP, GENITAL     Status: Abnormal   Collection Time    05/07/13  6:10 PM      Result Value Range   Yeast Wet Prep HPF POC NONE SEEN  NONE SEEN   Trich, Wet Prep NONE SEEN  NONE SEEN   Clue Cells Wet Prep HPF POC NONE SEEN  NONE SEEN   WBC, Wet Prep HPF POC MANY (*) NONE SEEN   US Ob Comp Less 14 Wks  05/07/2013   *RADIOLOGY REPORT*  Clinical Data: abd pain @ 7 wks; ;  OBSTETRIC <14 WK [place] AND TRANSVAGINAL OB US  Technique: Both transabdominal and transvaginal ultrasound examinations were performed for complete evaluation of the gestation as well as the maternal uterus, adnexal regions, and pelvic cul-de-sac.  Comparison: None.  Findings: Single intrauterine gestation with a crown-rump length of 7.7 mm for estimated gestational age of [redacted] weeks 5 days.  Fetal heart rate 104 beats per minute.  No subchorionic hemorrhage.  Ovaries are symmetric in size and echotexture.  No adnexal masses. No free fluid.  IMPRESSION: 6-week-5-day intrauterine pregnancy with a fetal heart rate of 104 beats per minute.   Original Report Authenticated By: Charlett Nose, M.D.     9:15 pm Pt feeling much better after bag of fluids with MV, smiling Discussed lab results- Rx Keflex for UTI Management of N&V reviewed, Rx for Diclegis  Pt has an appt next week for [facility]  Assessment and Plan  ASSESSMENT:  6 5/7 wks IUP UTI Nausea of pregnancy  PLAN:  Keflex 500 TID x 7d Diclegis for N&V  Blayton Huttner, Olegario Messier M. 05/07/2013, 4:15 PM

## 2013-05-07 NOTE — MAU Note (Signed)
C/o SOB but O2 sat is 100%; c/o nausea for past month but no vomiting; c/o being dizzy; G3P2;

## 2013-05-07 NOTE — MAU Note (Signed)
L flank and chest discomfort feels like "something moving up and down inside"; feels like my "heart is speeding-up and stopping";

## 2013-05-07 NOTE — MAU Note (Signed)
Anxious; husband with pt; Husband is very anxious also;

## 2013-05-08 LAB — URINE CULTURE
Colony Count: NO GROWTH
Culture: NO GROWTH

## 2013-05-08 LAB — GC/CHLAMYDIA PROBE AMP
CT Probe RNA: NEGATIVE
GC Probe RNA: NEGATIVE

## 2013-05-08 NOTE — MAU Provider Note (Signed)
Attestation of Attending Supervision of Advanced Practitioner (CNM/NP): Evaluation and management procedures were performed by the Advanced Practitioner under my supervision and collaboration. I have reviewed the Advanced Practitioner's note and chart, and I agree with the management and plan.  Shamika Pedregon H. 5:18 AM

## 2013-06-12 ENCOUNTER — Encounter: Payer: BC Managed Care – PPO | Admitting: Obstetrics & Gynecology

## 2013-10-19 ENCOUNTER — Ambulatory Visit (INDEPENDENT_AMBULATORY_CARE_PROVIDER_SITE_OTHER): Payer: BC Managed Care – PPO | Admitting: Obstetrics & Gynecology

## 2013-10-19 ENCOUNTER — Encounter: Payer: Self-pay | Admitting: Obstetrics & Gynecology

## 2013-10-19 VITALS — BP 115/72 | Temp 98.0°F | Wt 169.0 lb

## 2013-10-19 DIAGNOSIS — O0930 Supervision of pregnancy with insufficient antenatal care, unspecified trimester: Secondary | ICD-10-CM

## 2013-10-19 DIAGNOSIS — Z3483 Encounter for supervision of other normal pregnancy, third trimester: Secondary | ICD-10-CM

## 2013-10-19 DIAGNOSIS — N760 Acute vaginitis: Secondary | ICD-10-CM

## 2013-10-19 DIAGNOSIS — Z3201 Encounter for pregnancy test, result positive: Secondary | ICD-10-CM

## 2013-10-19 DIAGNOSIS — Z3480 Encounter for supervision of other normal pregnancy, unspecified trimester: Secondary | ICD-10-CM

## 2013-10-19 MED ORDER — AZITHROMYCIN 250 MG PO TABS: 250.0000 mg | ORAL_TABLET | Freq: Once | ORAL | Status: DC

## 2013-10-19 NOTE — Patient Instructions (Addendum)
Please remember to pick up (azithromycin) antibiotic from your pharmacy  Third Trimester of Pregnancy The third trimester is from week 29 through week 42, months 7 through 9. The third trimester is a time when the fetus is growing rapidly. At the end of the ninth month, the fetus is about 20 inches in length and weighs 6 10 pounds.  BODY CHANGES Your body goes through many changes during pregnancy. The changes vary from woman to woman.   Your weight will continue to increase. You can expect to gain 25 35 pounds (11 16 kg) by the end of the pregnancy.  You may begin to get stretch marks on your hips, abdomen, and breasts.  You may urinate more often because the fetus is moving lower into your pelvis and pressing on your bladder.  You may develop or continue to have heartburn as a result of your pregnancy.  You may develop constipation because certain hormones are causing the muscles that push waste through your intestines to slow down.  You may develop hemorrhoids or swollen, bulging veins (varicose veins).  You may have pelvic pain because of the weight gain and pregnancy hormones relaxing your joints between the bones in your pelvis. Back aches may result from over exertion of the muscles supporting your posture.  Your breasts will continue to grow and be tender. A yellow discharge may leak from your breasts called colostrum.  Your belly button may stick out.  You may feel short of breath because of your expanding uterus.  You may notice the fetus "dropping," or moving lower in your abdomen.  You may have a bloody mucus discharge. This usually occurs a few days to a week before labor begins.  Your cervix becomes thin and soft (effaced) near your due date. WHAT TO EXPECT AT YOUR PRENATAL EXAMS  You will have prenatal exams every 2 weeks until week 36. Then, you will have weekly prenatal exams. During a routine prenatal visit:  You will be weighed to make sure you and the fetus are  growing normally.  Your blood pressure is taken.  Your abdomen will be measured to track your baby's growth.  The fetal heartbeat will be listened to.  Any test results from the previous visit will be discussed.  You may have a cervical check near your due date to see if you have effaced. At around 36 weeks, your caregiver will check your cervix. At the same time, your caregiver will also perform a test on the secretions of the vaginal tissue. This test is to determine if a type of bacteria, Group B streptococcus, is present. Your caregiver will explain this further. Your caregiver may ask you:  What your birth plan is.  How you are feeling.  If you are feeling the baby move.  If you have had any abnormal symptoms, such as leaking fluid, bleeding, severe headaches, or abdominal cramping.  If you have any questions. Other tests or screenings that may be performed during your third trimester include:  Blood tests that check for low iron levels (anemia).  Fetal testing to check the health, activity level, and growth of the fetus. Testing is done if you have certain medical conditions or if there are problems during the pregnancy. FALSE LABOR You may feel small, irregular contractions that eventually go away. These are called Braxton Hicks contractions, or false labor. Contractions may last for hours, days, or even weeks before true labor sets in. If contractions come at regular intervals, intensify, or become  painful, it is best to be seen by your caregiver.  SIGNS OF LABOR   Menstrual-like cramps.  Contractions that are 5 minutes apart or less.  Contractions that start on the top of the uterus and spread down to the lower abdomen and back.  A sense of increased pelvic pressure or back pain.  A watery or bloody mucus discharge that comes from the vagina. If you have any of these signs before the 37th week of pregnancy, call your caregiver right away. You need to go to the  hospital to get checked immediately. HOME CARE INSTRUCTIONS   Avoid all smoking, herbs, alcohol, and unprescribed drugs. These chemicals affect the formation and growth of the baby.  Follow your caregiver's instructions regarding medicine use. There are medicines that are either safe or unsafe to take during pregnancy.  Exercise only as directed by your caregiver. Experiencing uterine cramps is a good sign to stop exercising.  Continue to eat regular, healthy meals.  Wear a good support bra for breast tenderness.  Do not use hot tubs, steam rooms, or saunas.  Wear your seat belt at all times when driving.  Avoid raw meat, uncooked cheese, cat litter boxes, and soil used by cats. These carry germs that can cause birth defects in the baby.  Take your prenatal vitamins.  Try taking a stool softener (if your caregiver approves) if you develop constipation. Eat more high-fiber foods, such as fresh vegetables or fruit and whole grains. Drink plenty of fluids to keep your urine clear or pale yellow.  Take warm sitz baths to soothe any pain or discomfort caused by hemorrhoids. Use hemorrhoid cream if your caregiver approves.  If you develop varicose veins, wear support hose. Elevate your feet for 15 minutes, 3 4 times a day. Limit salt in your diet.  Avoid heavy lifting, wear low heal shoes, and practice good posture.  Rest a lot with your legs elevated if you have leg cramps or low back pain.  Visit your dentist if you have not gone during your pregnancy. Use a soft toothbrush to brush your teeth and be gentle when you floss.  A sexual relationship may be continued unless your caregiver directs you otherwise.  Do not travel far distances unless it is absolutely necessary and only with the approval of your caregiver.  Take prenatal classes to understand, practice, and ask questions about the labor and delivery.  Make a trial run to the hospital.  Pack your hospital bag.  Prepare  the baby's nursery.  Continue to go to all your prenatal visits as directed by your caregiver. SEEK MEDICAL CARE IF:  You are unsure if you are in labor or if your water has broken.  You have dizziness.  You have mild pelvic cramps, pelvic pressure, or nagging pain in your abdominal area.  You have persistent nausea, vomiting, or diarrhea.  You have a bad smelling vaginal discharge.  You have pain with urination. SEEK IMMEDIATE MEDICAL CARE IF:   You have a fever.  You are leaking fluid from your vagina.  You have spotting or bleeding from your vagina.  You have severe abdominal cramping or pain.  You have rapid weight loss or gain.  You have shortness of breath with chest pain.  You notice sudden or extreme swelling of your face, hands, ankles, feet, or legs.  You have not felt your baby move in over an hour.  You have severe headaches that do not go away with medicine.  You  have vision changes. Document Released: 10/13/2001 Document Revised: 06/21/2013 Document Reviewed: 12/20/2012 [facility] Patient Information 2014 [place], [place].

## 2013-10-19 NOTE — Progress Notes (Signed)
HR - 91 Pt in office today for routine OB visit, wants to make sure she and baby are ok  Subjective:    Chelsea Castaneda is being seen today for her first obstetrical visit.  This is a planned pregnancy. She is at [redacted]w[redacted]d gestation. Her obstetrical history is significant for received OB care in [place]. Relationship with FOB: spouse, living together. Patient does intend to breast feed. Pregnancy history fully reviewed.  Menstrual History: OB History   Grav Para Term Preterm Abortions TAB SAB Ect Mult Living   3 2 2       2        Patient's last menstrual period was 05/20/2013.    The following portions of the patient's history were reviewed and updated as appropriate: allergies, current medications, past family history, past medical history, past social history, past surgical history and problem list.  Review of Systems Pertinent items are noted in HPI.    Objective:     Assessment:    Pregnancy at [redacted]w[redacted]d weeks    Plan:    Initial labs drawn. Prenatal vitamins.  Counseling provided regarding continued use of seat belts, cessation of alcohol consumption, smoking or use of illicit drugs; infection precautions i.e., influenza/TDAP immunizations, toxoplasmosis,CMV, parvovirus, listeria and varicella; workplace safety, exercise during pregnancy; routine dental care, safe medications, sexual activity, hot tubs, saunas, pools, travel, caffeine use, fish and methlymercury, potential toxins, hair treatments, varicose veins.  H/O positive PPD-->INH x 9 mths. Weight gain recommendations per IOM guidelines reviewed:  overweight/BMI 25 - 29.9--> gain 15 - 25 lbs Problem list reviewed and updated. Role of ultrasound in pregnancy discussed; fetal survey: ordered. Follow up in 2 weeks. 50% of 20 min visit spent on counseling and coordination of care.

## 2013-10-20 LAB — OBSTETRIC PANEL
Basophils Absolute: 0 10*3/uL (ref 0.0–0.1)
Basophils Relative: 0 % (ref 0–1)
Eosinophils Absolute: 0.1 10*3/uL (ref 0.0–0.7)
Hepatitis B Surface Ag: NEGATIVE
Lymphs Abs: 1.9 10*3/uL (ref 0.7–4.0)
MCH: 29.5 pg (ref 26.0–34.0)
Neutrophils Relative %: 66 % (ref 43–77)
Platelets: 171 10*3/uL (ref 150–400)
RBC: 3.8 MIL/uL — ABNORMAL LOW (ref 3.87–5.11)

## 2013-10-20 LAB — WET PREP BY MOLECULAR PROBE
Candida species: POSITIVE — AB
Trichomonas vaginosis: NEGATIVE

## 2013-10-23 LAB — HEMOGLOBINOPATHY EVALUATION
Hemoglobin Other: 0 %
Hgb A2 Quant: 2.7 % (ref 2.2–3.2)
Hgb A: 97.3 % (ref 96.8–97.8)
Hgb F Quant: 0 % (ref 0.0–2.0)
Hgb S Quant: 0 %

## 2013-10-23 LAB — PAP IG AND HPV HIGH-RISK

## 2013-10-27 ENCOUNTER — Encounter: Payer: Self-pay | Admitting: Obstetrics & Gynecology

## 2013-10-27 DIAGNOSIS — E559 Vitamin D deficiency, unspecified: Secondary | ICD-10-CM | POA: Insufficient documentation

## 2013-11-01 ENCOUNTER — Other Ambulatory Visit: Payer: Self-pay | Admitting: *Deleted

## 2013-11-01 DIAGNOSIS — E559 Vitamin D deficiency, unspecified: Secondary | ICD-10-CM

## 2013-11-01 DIAGNOSIS — B9689 Other specified bacterial agents as the cause of diseases classified elsewhere: Secondary | ICD-10-CM

## 2013-11-01 DIAGNOSIS — N760 Acute vaginitis: Secondary | ICD-10-CM

## 2013-11-01 MED ORDER — METRONIDAZOLE 500 MG PO TABS: 500.0000 mg | ORAL_TABLET | Freq: Two times a day (BID) | ORAL | Status: DC

## 2013-11-01 MED ORDER — OB COMPLETE PETITE 35-5-1-200 MG PO CAPS: 1.0000 | ORAL_CAPSULE | Freq: Every day | ORAL | Status: DC

## 2013-11-02 NOTE — L&D Delivery Note (Signed)
Delivery Note At 4:53 PM a viable female was delivered via Vaginal, Spontaneous Delivery (Presentation: ;  ).  APGAR: , 9; weight 7 lb 9.2 oz (3436 g).   Placenta status: Intact, Expressed.  Cord: 3 vessels with the following complications: None.  Cord pH: none  Anesthesia: Epidural  Episiotomy: None Lacerations: 2nd degree;Perineal Suture Repair: 2.0 vicryl rapide Est. Blood Loss (mL): 500  Mom to postpartum.  Baby to Couplet care / Skin to Skin.  HARPER,CHARLES A 12/19/2013, 5:54 PM

## 2013-11-06 ENCOUNTER — Other Ambulatory Visit: Payer: Self-pay | Admitting: *Deleted

## 2013-11-06 ENCOUNTER — Ambulatory Visit (INDEPENDENT_AMBULATORY_CARE_PROVIDER_SITE_OTHER): Payer: BC Managed Care – PPO | Admitting: Advanced Practice Midwife

## 2013-11-06 ENCOUNTER — Other Ambulatory Visit: Payer: BC Managed Care – PPO

## 2013-11-06 VITALS — BP 105/70 | Temp 98.1°F | Wt 172.0 lb

## 2013-11-06 DIAGNOSIS — Z3480 Encounter for supervision of other normal pregnancy, unspecified trimester: Secondary | ICD-10-CM

## 2013-11-06 DIAGNOSIS — Z1389 Encounter for screening for other disorder: Secondary | ICD-10-CM

## 2013-11-06 DIAGNOSIS — Z3483 Encounter for supervision of other normal pregnancy, third trimester: Secondary | ICD-10-CM

## 2013-11-06 LAB — POCT URINALYSIS DIPSTICK
BILIRUBIN UA: NEGATIVE
Blood, UA: NEGATIVE
GLUCOSE UA: NEGATIVE
Ketones, UA: NEGATIVE
NITRITE UA: NEGATIVE
PH UA: 5
Spec Grav, UA: 1.02
Urobilinogen, UA: NEGATIVE

## 2013-11-06 LAB — CBC
HCT: 33.5 % — ABNORMAL LOW (ref 36.0–46.0)
Hemoglobin: 11 g/dL — ABNORMAL LOW (ref 12.0–15.0)
MCH: 29.3 pg (ref 26.0–34.0)
MCHC: 32.8 g/dL (ref 30.0–36.0)
MCV: 89.1 fL (ref 78.0–100.0)
PLATELETS: 181 10*3/uL (ref 150–400)
RBC: 3.76 MIL/uL — AB (ref 3.87–5.11)
RDW: 14.6 % (ref 11.5–15.5)
WBC: 6.1 10*3/uL (ref 4.0–10.5)

## 2013-11-06 NOTE — Progress Notes (Signed)
Subjective: Chelsea Castaneda is a 33 y.o. at 32 6 weeks by early ultrasound  Patient denies vaginal leaking of fluid or bleeding, denies contractions.  Reports positive fetal movment.  Denies concerns today. Here today for GCT, labs, plans US tomorrow. Had ROS US in [place], does not have records. Plans to deliver in the [place].   Objective: Filed Vitals:   11/06/13 0936  BP: 105/70  Temp: 98.1 F (36.7 C)   150 FHR 32 Fundal Height Fetal Position unknown  Assessment: Patient Active Problem List   Diagnosis Date Noted  . Unspecified vitamin D deficiency 10/27/2013  . Supervision of other normal pregnancy 10/19/2013    Plan: Patient to return to clinic in 2 weeks Reviewed warning signs in pregnancy. Patient to call with concerns PRN. Reviewed triage location.  15 min spent with patient greater than 80% spent in counseling and coordination of care.    Chaun Uemura Wilson SingerWren CNM

## 2013-11-06 NOTE — Addendum Note (Signed)
Addended by: Marya LandryFOSTER, Jemeka Wagler D on: 11/06/2013 03:06 PM   Modules accepted: Orders

## 2013-11-06 NOTE — Addendum Note (Signed)
Addended by: Marya LandryFOSTER, Iretha Kirley D on: 11/06/2013 04:27 PM   Modules accepted: Orders

## 2013-11-06 NOTE — Progress Notes (Signed)
Pulse 90 Pt is doing well. 

## 2013-11-07 ENCOUNTER — Ambulatory Visit (INDEPENDENT_AMBULATORY_CARE_PROVIDER_SITE_OTHER): Payer: BC Managed Care – PPO

## 2013-11-07 DIAGNOSIS — Z1389 Encounter for screening for other disorder: Secondary | ICD-10-CM

## 2013-11-07 DIAGNOSIS — Z363 Encounter for antenatal screening for malformations: Secondary | ICD-10-CM

## 2013-11-07 LAB — GLUCOSE TOLERANCE, 2 HOURS W/ 1HR
GLUCOSE, FASTING: 58 mg/dL — AB (ref 70–99)
GLUCOSE: 112 mg/dL (ref 70–170)
Glucose, 2 hour: 115 mg/dL (ref 70–139)

## 2013-11-07 LAB — HIV ANTIBODY (ROUTINE TESTING W REFLEX): HIV: NONREACTIVE

## 2013-11-07 LAB — RPR

## 2013-11-08 ENCOUNTER — Encounter: Payer: Self-pay | Admitting: Obstetrics & Gynecology

## 2013-11-08 LAB — US OB DETAIL + 14 WK

## 2013-11-22 ENCOUNTER — Ambulatory Visit (INDEPENDENT_AMBULATORY_CARE_PROVIDER_SITE_OTHER): Payer: BC Managed Care – PPO | Admitting: Obstetrics & Gynecology

## 2013-11-22 VITALS — BP 104/63 | Temp 97.7°F | Wt 172.0 lb

## 2013-11-22 DIAGNOSIS — Z3480 Encounter for supervision of other normal pregnancy, unspecified trimester: Secondary | ICD-10-CM

## 2013-11-22 DIAGNOSIS — N898 Other specified noninflammatory disorders of vagina: Secondary | ICD-10-CM

## 2013-11-22 LAB — POCT URINALYSIS DIPSTICK
Bilirubin, UA: NEGATIVE
Blood, UA: NEGATIVE
Glucose, UA: NEGATIVE
KETONES UA: NEGATIVE
Nitrite, UA: NEGATIVE
PROTEIN UA: NEGATIVE
Spec Grav, UA: 1.015
UROBILINOGEN UA: NEGATIVE
pH, UA: 8

## 2013-11-22 NOTE — Patient Instructions (Signed)
Third Trimester of Pregnancy  The third trimester is from week 29 through week 42, months 7 through 9. The third trimester is a time when the fetus is growing rapidly. At the end of the ninth month, the fetus is about 20 inches in length and weighs 6 10 pounds.   BODY CHANGES  Your body goes through many changes during pregnancy. The changes vary from woman to woman.    Your weight will continue to increase. You can expect to gain 25 35 pounds (11 16 kg) by the end of the pregnancy.   You may begin to get stretch marks on your hips, abdomen, and breasts.   You may urinate more often because the fetus is moving lower into your pelvis and pressing on your bladder.   You may develop or continue to have heartburn as a result of your pregnancy.   You may develop constipation because certain hormones are causing the muscles that push waste through your intestines to slow down.   You may develop hemorrhoids or swollen, bulging veins (varicose veins).   You may have pelvic pain because of the weight gain and pregnancy hormones relaxing your joints between the bones in your pelvis. Back aches may result from over exertion of the muscles supporting your posture.   Your breasts will continue to grow and be tender. A yellow discharge may leak from your breasts called colostrum.   Your belly button may stick out.   You may feel short of breath because of your expanding uterus.   You may notice the fetus "dropping," or moving lower in your abdomen.   You may have a bloody mucus discharge. This usually occurs a few days to a week before labor begins.   Your cervix becomes thin and soft (effaced) near your due date.  WHAT TO EXPECT AT YOUR PRENATAL EXAMS   You will have prenatal exams every 2 weeks until week 36. Then, you will have weekly prenatal exams. During a routine prenatal visit:   You will be weighed to make sure you and the fetus are growing normally.   Your blood pressure is taken.   Your abdomen will be  measured to track your baby's growth.   The fetal heartbeat will be listened to.   Any test results from the previous visit will be discussed.   You may have a cervical check near your due date to see if you have effaced.  At around 36 weeks, your caregiver will check your cervix. At the same time, your caregiver will also perform a test on the secretions of the vaginal tissue. This test is to determine if a type of bacteria, Group B streptococcus, is present. Your caregiver will explain this further.  Your caregiver may ask you:   What your birth plan is.   How you are feeling.   If you are feeling the baby move.   If you have had any abnormal symptoms, such as leaking fluid, bleeding, severe headaches, or abdominal cramping.   If you have any questions.  Other tests or screenings that may be performed during your third trimester include:   Blood tests that check for low iron levels (anemia).   Fetal testing to check the health, activity level, and growth of the fetus. Testing is done if you have certain medical conditions or if there are problems during the pregnancy.  FALSE LABOR  You may feel small, irregular contractions that eventually go away. These are called Braxton Hicks contractions, or   false labor. Contractions may last for hours, days, or even weeks before true labor sets in. If contractions come at regular intervals, intensify, or become painful, it is best to be seen by your caregiver.   SIGNS OF LABOR    Menstrual-like cramps.   Contractions that are 5 minutes apart or less.   Contractions that start on the top of the uterus and spread down to the lower abdomen and back.   A sense of increased pelvic pressure or back pain.   A watery or bloody mucus discharge that comes from the vagina.  If you have any of these signs before the 37th week of pregnancy, call your caregiver right away. You need to go to the hospital to get checked immediately.  HOME CARE INSTRUCTIONS    Avoid all  smoking, herbs, alcohol, and unprescribed drugs. These chemicals affect the formation and growth of the baby.   Follow your caregiver's instructions regarding medicine use. There are medicines that are either safe or unsafe to take during pregnancy.   Exercise only as directed by your caregiver. Experiencing uterine cramps is a good sign to stop exercising.   Continue to eat regular, healthy meals.   Wear a good support bra for breast tenderness.   Do not use hot tubs, steam rooms, or saunas.   Wear your seat belt at all times when driving.   Avoid raw meat, uncooked cheese, cat litter boxes, and soil used by cats. These carry germs that can cause birth defects in the baby.   Take your prenatal vitamins.   Try taking a stool softener (if your caregiver approves) if you develop constipation. Eat more high-fiber foods, such as fresh vegetables or fruit and whole grains. Drink plenty of fluids to keep your urine clear or pale yellow.   Take warm sitz baths to soothe any pain or discomfort caused by hemorrhoids. Use hemorrhoid cream if your caregiver approves.   If you develop varicose veins, wear support hose. Elevate your feet for 15 minutes, 3 4 times a day. Limit salt in your diet.   Avoid heavy lifting, wear low heal shoes, and practice good posture.   Rest a lot with your legs elevated if you have leg cramps or low back pain.   Visit your dentist if you have not gone during your pregnancy. Use a soft toothbrush to brush your teeth and be gentle when you floss.   A sexual relationship may be continued unless your caregiver directs you otherwise.   Do not travel far distances unless it is absolutely necessary and only with the approval of your caregiver.   Take prenatal classes to understand, practice, and ask questions about the labor and delivery.   Make a trial run to the hospital.   Pack your hospital bag.   Prepare the baby's nursery.   Continue to go to all your prenatal visits as directed  by your caregiver.  SEEK MEDICAL CARE IF:   You are unsure if you are in labor or if your water has broken.   You have dizziness.   You have mild pelvic cramps, pelvic pressure, or nagging pain in your abdominal area.   You have persistent nausea, vomiting, or diarrhea.   You have a bad smelling vaginal discharge.   You have pain with urination.  SEEK IMMEDIATE MEDICAL CARE IF:    You have a fever.   You are leaking fluid from your vagina.   You have spotting or bleeding from your vagina.     You have severe abdominal cramping or pain.   You have rapid weight loss or gain.   You have shortness of breath with chest pain.   You notice sudden or extreme swelling of your face, hands, ankles, feet, or legs.   You have not felt your baby move in over an hour.   You have severe headaches that do not go away with medicine.   You have vision changes.  Document Released: 10/13/2001 Document Revised: 06/21/2013 Document Reviewed: 12/20/2012  [employer] Patient Information 2014 [employer].

## 2013-11-22 NOTE — Progress Notes (Signed)
HR - 96 Pt in office today for routine OB visit, reports having small amount of blood in urine two days ago, none on tissue when she wiped, reports still having vaginal irritation, odor or increased discharge.  SPEC: curd-like discharge.  Affirm sent.  Considering a Mirena IUD.

## 2013-11-23 LAB — WET PREP BY MOLECULAR PROBE
Candida species: POSITIVE — AB
Gardnerella vaginalis: POSITIVE — AB
Trichomonas vaginosis: NEGATIVE

## 2013-11-24 LAB — STREP B DNA PROBE: STREP GROUP B AG: NEGATIVE

## 2013-11-27 ENCOUNTER — Telehealth: Payer: Self-pay | Admitting: *Deleted

## 2013-12-02 ENCOUNTER — Ambulatory Visit (INDEPENDENT_AMBULATORY_CARE_PROVIDER_SITE_OTHER): Payer: BC Managed Care – PPO | Admitting: Internal Medicine

## 2013-12-02 VITALS — BP 102/60 | HR 79 | Temp 98.1°F | Resp 18 | Ht 63.5 in | Wt 170.0 lb

## 2013-12-02 DIAGNOSIS — H6120 Impacted cerumen, unspecified ear: Secondary | ICD-10-CM

## 2013-12-02 DIAGNOSIS — M79609 Pain in unspecified limb: Secondary | ICD-10-CM

## 2013-12-02 DIAGNOSIS — M25519 Pain in unspecified shoulder: Secondary | ICD-10-CM

## 2013-12-02 DIAGNOSIS — M25511 Pain in right shoulder: Secondary | ICD-10-CM

## 2013-12-02 NOTE — Progress Notes (Addendum)
   Subjective:    Patient ID: Chelsea ChingSafaa Abou Castaneda, female    DOB: 1980/12/22, 33 y.o.   MRN: [id number]  HPI HPI Comments: Chelsea Castaneda is a 33 y.o. female who presents to the [facility] complaining of pain in her right thumb and right shoulder, onset 2 weeks ago. States shoulder pain bothers her at night. Pt denies any injury or trauma related to current pain. Pt is currently pregnant and states she spends most of the day relaxing. Does not report any exessive use of right thumb or right shoulder. Pt does not work.  No known injury/no redness or inflammation Pt also reports excessive wax in her ears. 8 recurrent problem that bothers her  immensely  Review of Systems No fever chills or night sweats No chest pain or palpitations Patient is pregnant again    Objective:   Physical Exam  Vitals reviewed. Constitutional: She is oriented to person, place, and time. She appears well-developed and well-nourished. No distress.  HENT:  Head: Normocephalic and atraumatic.  Both external canals have cerumen impaction.   Eyes: EOM are normal. Pupils are equal, round, and reactive to light.  Neck: Normal range of motion. Neck supple. No thyromegaly present.  Cardiovascular: Normal rate.   Pulmonary/Chest: Effort normal. No respiratory distress.  Musculoskeletal: Normal range of motion.  RIGHT SHOULDER: pain with all ROM and especially pain with abduction against resistance. Maximal tenderness is in the trapezius.   RIGHT THUMB: tender to palpation and manipulation of the IP joint. Snuff box negative. Negative Finklesteins. No sensory or motor loss in right hand   Lymphadenopathy:    She has no cervical adenopathy.  Neurological: She is alert and oriented to person, place, and time.  Skin: Skin is warm and dry.  Psychiatric: She has a normal mood and affect. Her behavior is normal.      Assessment & Plan:  Treatment includes exercises to stretch out the shoulder  joints and a medication for pain. Pts right thumb will be put in a split to limit mobility. Pt is also instructed to put right arm into hot water to loosen those joints up. For excessive ear wax, pt is instructed to use mineral oil a few times a week to loosen up wax.  12:17 PM: Ears clear following cerumen impaction removal. Pt is advised she can take tylenol at night for pain relief. Regarding shoulder and thumb pain, pt is advised if she is not feeling better in 2-3 weeks to seek referral for physical therapy. She will also be given a list of exercises to perform to help with pain.   I have completed the patient encounter in its entirety as documented by the scribe, with editing by me where necessary. Kamela Blansett P. Merla Richesoolittle, M.D. Right shoulder pain and r wrist pain ? etio  2 discuss with OB regarding NSAIDs/exercises/splint thumb and follow Pregnancy-- Cerumen impaction  Mineral oil prophylaxis

## 2013-12-02 NOTE — Patient Instructions (Signed)
Surgery for Rotator Cuff Tear  with Rehab  Rotator cuff surgery is only recommended for individuals who have experienced persistent disability for greater than 3 months of non-surgical (conservative) treatment. Surgery is not necessary but is recommended for individuals who experience difficulty completing daily activities or athletes who are unable to compete. Rotator cuff tears do not usually heal without surgical intervention. If left alone small rotator cuff tears usually become larger. Younger athletes who have a rotator cuff tear may be recommended for surgery without attempting conservative rehabilitation. The purpose of surgery is to regain function of the shoulder joint and eliminate pain associated with the injury. In addition to repairing the tendon tear, the surgery often removes a portion of the bony roof of the shoulder (acromion) as well as the chronically thickened and inflamed membrane below the acromion (subacromial bursa).  REASONS NOT TO OPERATE   · Infection of the shoulder.  · Inability to complete a rehabilitation program.  · Patients who have other conditions (emotional or psychological) conditions that contribute to their shoulder condition.  RISKS AND COMPLICATIONS  · Infection.  · Re-tear of the rotator cuff tendons or muscles.  · Shoulder stiffness and/or weakness.  · Inability to compete in athletics.  · Acromioclavicular (AC) joint paint.  · Risks of surgery: infection, bleeding, nerve damage, or damage to surrounding tissues.  TECHNIQUE  There are different surgical procedures used to treat rotator cuff tears. The type of procedure depends on the extent of injury as well as the surgeon's preference. All of the surgical techniques for rotator cuff tears have the same goal of repairing the torn tendon, removing part of the acromion, and removing the subacromial bursa. There are two main types of procedures: arthroscopic and open incision.  Arthroscopic procedures are usually completed  and you go home the same day as surgery (outpatient). These procedures use multiple small incisions in which tools and a video camera are placed to work on the shoulder. An electric shaver removes the bursa, then a power burr shaves down the portion of the acromion that places pressure on the rotator cuff. Finally the rotator cuff is sewed (sutured) back to the humeral head.  Open incision procedures require a larger incision. The deltoid muscle is detached from the acromion and a ligament in the shoulder (coracoacromial) is cut in order for the surgeon to access the rotator cuff. The subacromial bursa is removed as well as part of the acromion to give the rotator cuff room to move freely. The torn tendon is then sutured to the humeral head. After the rotator cuff is repaired, then the deltoid is reattached and the incision is closed up.   RECOVERY   · Post-operative care depends on the surgical technique and the preferences of your therapist.  · Keep the wound clean and dry for the first 10 to 14 days after surgery.  · Keep your shoulder and arm in the sling provided to you for as long as you have been instructed to.  · You will be given pain medications by your caregiver.  · Passive (without using muscles) shoulder movements may be begun immediately after surgery.  · It is important to follow through with you rehabilitation program in order to have the best possible recovery.  RETURN TO SPORTS   · The rehabilitation period will depend on the sport and position you play as well as the success of the operation.  · The minimum recovery period is 6 months.  · You must have   regained complete shoulder motion and strength before returning to sports.  SEEK IMMEDIATE MEDICAL CARE IF:   · Any medications produce adverse side effects.  · Any complications from surgery occur:  · Pain, numbness, or coldness in the extremity operated upon.  · Discoloration of the nail beds (they become blue or gray) of the extremity operated  upon.  · Signs of infections (fever, pain, inflammation, redness, or persistent bleeding).  EXERCISES   RANGE OF MOTION (ROM) AND STRETCHING EXERCISES - Rotator Cuff Tear, Surgery For  These exercises may help you restore your elbow mobility once your physician has discontinued your immobilization period. Beginning these before your provider's approval may result in delayed healing. Your symptoms may resolve with or without further involvement from your physician, physical therapist or athletic trainer. While completing these exercises, remember:   · Restoring tissue flexibility helps normal motion to return to the joints. This allows healthier, less painful movement and activity.  · An effective stretch should be held for at least 30 seconds. A stretch should never be painful. You should only feel a gentle lengthening or release in the stretch.  ROM - Pendulum   · Bend at the waist so that your right / left arm falls away from your body. Support yourself with your opposite hand on a solid surface, such as a table or a countertop.  · Your right / left arm should be perpendicular to the ground. If it is not perpendicular, you need to lean over farther. Relax the muscles in your right / left arm and shoulder as much as possible.  · Gently sway your hips and trunk so they move your right / left arm without any use of your right / left shoulder muscles.  · Progress your movements so that your right / left arm moves side to side, then forward and backward, and finally, both clockwise and counterclockwise.  · Complete __________ repetitions in each direction. Many people use this exercise to relieve discomfort in their shoulder as well as to gain range of motion.  Repeat __________ times. Complete this exercise __________ times per day.  STRETCH  Flexion, Seated   · Sit in a firm chair so that your right / left forearm can rest on a table or on a table or countertop. Your right / left elbow should rest below the height of  your shoulder so that your shoulder feels supported and not tense or uncomfortable.  · Keeping your right / left shoulder relaxed, lean forward at your waist, allowing your right / left hand to slide forward. Bend forward until you feel a moderate stretch in your shoulder, but before you feel an increase in your pain.  · Hold __________ seconds. Slowly return to your starting position.  Repeat __________ times. Complete this exercise __________ times per day.   STRETCH  Flexion, Standing   · Stand with good posture. With an underhand grip on your right / left and an overhand grip on the opposite hand, grasp a broomstick or cane so that your hands are a little more than shoulder-width apart.  · Keeping your right / left elbow straight and shoulder muscles relaxed, push the stick with your opposite hand to raise your right / left arm in front of your body and then overhead. Raise your arm until you feel a stretch in your right / left shoulder, but before you have increased shoulder pain.  · Try to avoid shrugging your right / left shoulder as your arm   rises by keeping your shoulder blade tucked down and toward your mid-back spine. Hold __________ seconds.  · Slowly return to the starting position.  Repeat __________ times. Complete this exercise __________ times per day.   STRETCH  Abduction, Supine   · Stand with good posture. With an underhand grip on your right / left and an overhand grip on the opposite hand, grasp a broomstick or cane so that your hands are a little more than shoulder-width apart.  · Keeping your right / left elbow straight and shoulder muscles relaxed, push the stick with your opposite hand to raise your right / left arm out to the side of your body and then overhead. Raise your arm until you feel a stretch in your right / left shoulder, but before you have increased shoulder pain.  · Try to avoid shrugging your right / left shoulder as your arm rises by keeping your shoulder blade tucked down  and toward your mid-back spine. Hold __________ seconds.  · Slowly return to the starting position.  Repeat __________ times. Complete this exercise __________ times per day.   ROM  Flexion, Active-Assisted  · Lie on your back. You may bend your knees for comfort.  · Grasp a broomstick or cane so your hands are about shoulder-width apart. Your right / left hand should grip the end of the stick/cane so that your hand is positioned "thumbs-up," as if you were about to shake hands.  · Using your healthy arm to lead, raise your right / left arm overhead until you feel a gentle stretch in your shoulder. Hold __________ seconds.  · Use the stick/cane to assist in returning your right / left arm to its starting position.  Repeat __________ times. Complete this exercise __________ times per day.   STRETCH  External Rotation   · Tuck a folded towel or small ball under your right / left upper arm. Grasp a broomstick or cane with an underhand grasp a little more than shoulder width apart. Bend your elbows to 90 degrees.  · Stand with good posture or sit in a chair without arms.  · Use your strong arm to push the stick across your body. Do not allow the towel or ball to fall. This will rotate your right / left arm away from your abdomen. Using the stick turn/rotate your hand and forearm away from your body. Hold __________ seconds.  Repeat __________ times. Complete this exercise __________ times per day.   STRENGTHENING EXERCISES - Rotator Cuff Tear, Surgery For  These exercises may help you begin to restore your elbow strength in the initial stage of your rehabilitation. Your physician will determine when you begin these exercises depending on the severity of your injury and the integrity of your repaired tissues. Beginning these before your provider's approval may result in delayed healing. While completing these exercises, remember:   · Muscles can gain both the endurance and the strength needed for everyday activities  through controlled exercises.  · Complete these exercises as instructed by your physician, physical therapist or athletic trainer. Progress the resistance and repetitions only as guided.  · You may experience muscle soreness or fatigue, but the pain or discomfort you are trying to eliminate should never worsen during these exercises. If this pain does worsen, stop and make certain you are following the directions exactly. If the pain is still present after adjustments, discontinue the exercise until you can discuss the trouble with your clinician.  STRENGTH - Shoulder Flexion, Isometric   ·   With good posture and facing a wall, stand or sit about 4-6 inches away.  · Keeping your right / left elbow straight, gently press the top of your fist into the wall. Increase the pressure gradually until you are pressing as hard as you can without shrugging your shoulder or increasing any shoulder discomfort.  · Hold __________ seconds. Release the tension slowly. Relax your shoulder muscles completely before you do the next repetition.  Repeat __________ times. Complete this exercise __________ times per day.   STRENGTH - Shoulder Abductors, Isometric   · With good posture, stand or sit about 4-6 inches from a wall with your right / left side facing the wall.  · Bend your right / left elbow. Gently press your right / left elbow into the wall. Increase the pressure gradually until you are pressing as hard as you can without shrugging your shoulder or increasing any shoulder discomfort.  · Hold __________ seconds.  · Release the tension slowly. Relax your shoulder muscles completely before you do the next repetition.  Repeat __________ times. Complete this exercise __________ times per day.   STRENGTH - Internal Rotators, Isometric   · Keep your right / left elbow at your side and bend it 90 degrees.  · Step into a door frame so that the inside of your right / left wrist can press against the door frame without your upper arm  leaving your side.  · Gently press your right / left wrist into the door frame as if you were trying to draw the palm of your hand to your abdomen. Gradually increase the tension until you are pressing as hard as you can without shrugging your shoulder or increasing any shoulder discomfort.  · Hold __________ seconds.  · Release the tension slowly. Relax your shoulder muscles completely before you do the next repetition.  Repeat __________ times. Complete this exercise __________ times per day.   STRENGTH - External Rotators, Isometric   · Keep your right / left elbow at your side and bend it 90 degrees.  · Step into a door frame so that the outside of your right / left wrist can press against the door frame without your upper arm leaving your side.  · Gently press your right / left wrist into the door frame as if you were trying to swing the back of your hand away from your abdomen. Gradually increase the tension until you are pressing as hard as you can without shrugging your shoulder or increasing any shoulder discomfort.  · Hold __________ seconds.  · Release the tension slowly. Relax your shoulder muscles completely before you do the next repetition.  Repeat __________ times. Complete this exercise __________ times per day.   Document Released: 10/19/2005 Document Revised: 01/11/2012 Document Reviewed: 01/31/2009  [employer]® Patient Information ©2014 [employer].

## 2013-12-06 ENCOUNTER — Ambulatory Visit (INDEPENDENT_AMBULATORY_CARE_PROVIDER_SITE_OTHER): Payer: BC Managed Care – PPO | Admitting: Obstetrics

## 2013-12-06 VITALS — BP 115/73 | Temp 97.2°F | Wt 173.0 lb

## 2013-12-06 DIAGNOSIS — Z3480 Encounter for supervision of other normal pregnancy, unspecified trimester: Secondary | ICD-10-CM

## 2013-12-06 LAB — POCT URINALYSIS DIPSTICK
BILIRUBIN UA: NEGATIVE
Glucose, UA: NEGATIVE
KETONES UA: NEGATIVE
Nitrite, UA: NEGATIVE
PH UA: 5
Protein, UA: NEGATIVE
RBC UA: NEGATIVE
Spec Grav, UA: 1.01
Urobilinogen, UA: NEGATIVE

## 2013-12-06 MED ORDER — FLUCONAZOLE 150 MG PO TABS: 150.0000 mg | ORAL_TABLET | Freq: Once | ORAL | Status: DC

## 2013-12-06 NOTE — Progress Notes (Signed)
Pulse_94 Patient states she is having lower abdomen pressure.

## 2013-12-12 ENCOUNTER — Other Ambulatory Visit: Payer: Self-pay | Admitting: *Deleted

## 2013-12-12 DIAGNOSIS — N760 Acute vaginitis: Principal | ICD-10-CM

## 2013-12-12 DIAGNOSIS — B9689 Other specified bacterial agents as the cause of diseases classified elsewhere: Secondary | ICD-10-CM

## 2013-12-12 MED ORDER — METRONIDAZOLE 500 MG PO TABS
500.0000 mg | ORAL_TABLET | Freq: Two times a day (BID) | ORAL | Status: DC
Start: 2013-12-12 — End: 2013-12-21

## 2013-12-13 ENCOUNTER — Ambulatory Visit (INDEPENDENT_AMBULATORY_CARE_PROVIDER_SITE_OTHER): Payer: BC Managed Care – PPO | Admitting: Obstetrics & Gynecology

## 2013-12-13 VITALS — BP 118/75 | Temp 97.9°F | Wt 170.0 lb

## 2013-12-13 DIAGNOSIS — Z3480 Encounter for supervision of other normal pregnancy, unspecified trimester: Secondary | ICD-10-CM

## 2013-12-13 LAB — POCT URINALYSIS DIPSTICK
BILIRUBIN UA: NEGATIVE
GLUCOSE UA: NEGATIVE
Ketones, UA: NEGATIVE
Leukocytes, UA: NEGATIVE
Nitrite, UA: NEGATIVE
Protein, UA: NEGATIVE
RBC UA: NEGATIVE
SPEC GRAV UA: 1.015
Urobilinogen, UA: NEGATIVE
pH, UA: 5

## 2013-12-13 NOTE — Progress Notes (Signed)
Pulse 82 Pt is having some lower right pelvic and back pain. Pt states that this has been occurring for the past 3 days. Pt notices irregular contractions as well.

## 2013-12-13 NOTE — Patient Instructions (Signed)
Third Trimester of Pregnancy  The third trimester is from week 29 through week 42, months 7 through 9. The third trimester is a time when the fetus is growing rapidly. At the end of the ninth month, the fetus is about 20 inches in length and weighs 6 10 pounds.   BODY CHANGES  Your body goes through many changes during pregnancy. The changes vary from woman to woman.    Your weight will continue to increase. You can expect to gain 25 35 pounds (11 16 kg) by the end of the pregnancy.   You may begin to get stretch marks on your hips, abdomen, and breasts.   You may urinate more often because the fetus is moving lower into your pelvis and pressing on your bladder.   You may develop or continue to have heartburn as a result of your pregnancy.   You may develop constipation because certain hormones are causing the muscles that push waste through your intestines to slow down.   You may develop hemorrhoids or swollen, bulging veins (varicose veins).   You may have pelvic pain because of the weight gain and pregnancy hormones relaxing your joints between the bones in your pelvis. Back aches may result from over exertion of the muscles supporting your posture.   Your breasts will continue to grow and be tender. A yellow discharge may leak from your breasts called colostrum.   Your belly button may stick out.   You may feel short of breath because of your expanding uterus.   You may notice the fetus "dropping," or moving lower in your abdomen.   You may have a bloody mucus discharge. This usually occurs a few days to a week before labor begins.   Your cervix becomes thin and soft (effaced) near your due date.  WHAT TO EXPECT AT YOUR PRENATAL EXAMS   You will have prenatal exams every 2 weeks until week 36. Then, you will have weekly prenatal exams. During a routine prenatal visit:   You will be weighed to make sure you and the fetus are growing normally.   Your blood pressure is taken.   Your abdomen will be  measured to track your baby's growth.   The fetal heartbeat will be listened to.   Any test results from the previous visit will be discussed.   You may have a cervical check near your due date to see if you have effaced.  At around 36 weeks, your caregiver will check your cervix. At the same time, your caregiver will also perform a test on the secretions of the vaginal tissue. This test is to determine if a type of bacteria, Group B streptococcus, is present. Your caregiver will explain this further.  Your caregiver may ask you:   What your birth plan is.   How you are feeling.   If you are feeling the baby move.   If you have had any abnormal symptoms, such as leaking fluid, bleeding, severe headaches, or abdominal cramping.   If you have any questions.  Other tests or screenings that may be performed during your third trimester include:   Blood tests that check for low iron levels (anemia).   Fetal testing to check the health, activity level, and growth of the fetus. Testing is done if you have certain medical conditions or if there are problems during the pregnancy.  FALSE LABOR  You may feel small, irregular contractions that eventually go away. These are called Braxton Hicks contractions, or   false labor. Contractions may last for hours, days, or even weeks before true labor sets in. If contractions come at regular intervals, intensify, or become painful, it is best to be seen by your caregiver.   SIGNS OF LABOR    Menstrual-like cramps.   Contractions that are 5 minutes apart or less.   Contractions that start on the top of the uterus and spread down to the lower abdomen and back.   A sense of increased pelvic pressure or back pain.   A watery or bloody mucus discharge that comes from the vagina.  If you have any of these signs before the 37th week of pregnancy, call your caregiver right away. You need to go to the hospital to get checked immediately.  HOME CARE INSTRUCTIONS    Avoid all  smoking, herbs, alcohol, and unprescribed drugs. These chemicals affect the formation and growth of the baby.   Follow your caregiver's instructions regarding medicine use. There are medicines that are either safe or unsafe to take during pregnancy.   Exercise only as directed by your caregiver. Experiencing uterine cramps is a good sign to stop exercising.   Continue to eat regular, healthy meals.   Wear a good support bra for breast tenderness.   Do not use hot tubs, steam rooms, or saunas.   Wear your seat belt at all times when driving.   Avoid raw meat, uncooked cheese, cat litter boxes, and soil used by cats. These carry germs that can cause birth defects in the baby.   Take your prenatal vitamins.   Try taking a stool softener (if your caregiver approves) if you develop constipation. Eat more high-fiber foods, such as fresh vegetables or fruit and whole grains. Drink plenty of fluids to keep your urine clear or pale yellow.   Take warm sitz baths to soothe any pain or discomfort caused by hemorrhoids. Use hemorrhoid cream if your caregiver approves.   If you develop varicose veins, wear support hose. Elevate your feet for 15 minutes, 3 4 times a day. Limit salt in your diet.   Avoid heavy lifting, wear low heal shoes, and practice good posture.   Rest a lot with your legs elevated if you have leg cramps or low back pain.   Visit your dentist if you have not gone during your pregnancy. Use a soft toothbrush to brush your teeth and be gentle when you floss.   A sexual relationship may be continued unless your caregiver directs you otherwise.   Do not travel far distances unless it is absolutely necessary and only with the approval of your caregiver.   Take prenatal classes to understand, practice, and ask questions about the labor and delivery.   Make a trial run to the hospital.   Pack your hospital bag.   Prepare the baby's nursery.   Continue to go to all your prenatal visits as directed  by your caregiver.  SEEK MEDICAL CARE IF:   You are unsure if you are in labor or if your water has broken.   You have dizziness.   You have mild pelvic cramps, pelvic pressure, or nagging pain in your abdominal area.   You have persistent nausea, vomiting, or diarrhea.   You have a bad smelling vaginal discharge.   You have pain with urination.  SEEK IMMEDIATE MEDICAL CARE IF:    You have a fever.   You are leaking fluid from your vagina.   You have spotting or bleeding from your vagina.     You have severe abdominal cramping or pain.   You have rapid weight loss or gain.   You have shortness of breath with chest pain.   You notice sudden or extreme swelling of your face, hands, ankles, feet, or legs.   You have not felt your baby move in over an hour.   You have severe headaches that do not go away with medicine.   You have vision changes.  Document Released: 10/13/2001 Document Revised: 06/21/2013 Document Reviewed: 12/20/2012  [employer] Patient Information 2014 [employer].

## 2013-12-18 ENCOUNTER — Inpatient Hospital Stay (HOSPITAL_COMMUNITY)
Admission: AD | Admit: 2013-12-18 | Discharge: 2013-12-21 | DRG: 775 | Disposition: A | Discharge status: Home / Self Care | Payer: BC Managed Care – PPO | Source: Ambulatory Visit | Attending: Obstetrics | Admitting: Obstetrics

## 2013-12-18 ENCOUNTER — Encounter (HOSPITAL_COMMUNITY): Payer: Self-pay | Admitting: *Deleted

## 2013-12-18 DIAGNOSIS — O4290 Premature rupture of membranes, unspecified as to length of time between rupture and onset of labor, unspecified weeks of gestation: Principal | ICD-10-CM | POA: Diagnosis present

## 2013-12-18 DIAGNOSIS — O42919 Preterm premature rupture of membranes, unspecified as to length of time between rupture and onset of labor, unspecified trimester: Secondary | ICD-10-CM | POA: Diagnosis present

## 2013-12-18 LAB — POCT URINALYSIS DIPSTICK

## 2013-12-18 LAB — CBC
HCT: 34.1 % — ABNORMAL LOW (ref 36.0–46.0)
Hemoglobin: 11.6 g/dL — ABNORMAL LOW (ref 12.0–15.0)
MCH: 29.3 pg (ref 26.0–34.0)
MCHC: 34 g/dL (ref 30.0–36.0)
MCV: 86.1 fL (ref 78.0–100.0)
Platelets: 151 10*3/uL (ref 150–400)
RBC: 3.96 MIL/uL (ref 3.87–5.11)
RDW: 16 % — ABNORMAL HIGH (ref 11.5–15.5)
WBC: 7.7 10*3/uL (ref 4.0–10.5)

## 2013-12-18 LAB — POCT FERN TEST: POCT Fern Test: POSITIVE

## 2013-12-18 MED ORDER — LACTATED RINGERS IV SOLN: 500.0000 mL | INTRAVENOUS | Status: DC | PRN

## 2013-12-18 MED ORDER — ONDANSETRON HCL 4 MG/2ML IJ SOLN
4.0000 mg | Freq: Four times a day (QID) | INTRAMUSCULAR | Status: DC | PRN
  Administered 2013-12-19: 4 mg via INTRAVENOUS
  Filled 2013-12-18: qty 2

## 2013-12-18 MED ORDER — ACETAMINOPHEN 325 MG PO TABS: 650.0000 mg | ORAL_TABLET | ORAL | Status: DC | PRN

## 2013-12-18 MED ORDER — LACTATED RINGERS IV SOLN
500.0000 mL | Freq: Once | INTRAVENOUS | Status: AC
  Administered 2013-12-19: 500 mL via INTRAVENOUS

## 2013-12-18 MED ORDER — PHENYLEPHRINE 40 MCG/ML (10ML) SYRINGE FOR IV PUSH (FOR BLOOD PRESSURE SUPPORT)
80.0000 ug | PREFILLED_SYRINGE | INTRAVENOUS | Status: DC | PRN
  Filled 2013-12-18: qty 10
  Filled 2013-12-18: qty 2

## 2013-12-18 MED ORDER — LACTATED RINGERS IV SOLN
INTRAVENOUS | Status: DC
  Administered 2013-12-18 – 2013-12-19 (×3): via INTRAVENOUS

## 2013-12-18 MED ORDER — OXYCODONE-ACETAMINOPHEN 5-325 MG PO TABS: 1.0000 | ORAL_TABLET | ORAL | Status: DC | PRN

## 2013-12-18 MED ORDER — DIPHENHYDRAMINE HCL 50 MG/ML IJ SOLN: 12.5000 mg | INTRAMUSCULAR | Status: DC | PRN

## 2013-12-18 MED ORDER — PHENYLEPHRINE 40 MCG/ML (10ML) SYRINGE FOR IV PUSH (FOR BLOOD PRESSURE SUPPORT)
80.0000 ug | PREFILLED_SYRINGE | INTRAVENOUS | Status: DC | PRN
  Filled 2013-12-18: qty 2

## 2013-12-18 MED ORDER — OXYTOCIN 40 UNITS IN LACTATED RINGERS INFUSION - SIMPLE MED: 62.5000 mL/h | INTRAVENOUS | Status: DC

## 2013-12-18 MED ORDER — IBUPROFEN 600 MG PO TABS
600.0000 mg | ORAL_TABLET | Freq: Four times a day (QID) | ORAL | Status: DC | PRN
  Filled 2013-12-18: qty 1

## 2013-12-18 MED ORDER — TERBUTALINE SULFATE 1 MG/ML IJ SOLN: 0.2500 mg | Freq: Once | INTRAMUSCULAR | Status: AC | PRN

## 2013-12-18 MED ORDER — OXYTOCIN 40 UNITS IN LACTATED RINGERS INFUSION - SIMPLE MED
1.0000 m[IU]/min | INTRAVENOUS | Status: DC
  Administered 2013-12-18: 2 m[IU]/min via INTRAVENOUS
  Filled 2013-12-18: qty 1000

## 2013-12-18 MED ORDER — EPHEDRINE 5 MG/ML INJ
10.0000 mg | INTRAVENOUS | Status: DC | PRN
Start: 2013-12-18 — End: 2013-12-19
  Filled 2013-12-18: qty 2
  Filled 2013-12-18: qty 4

## 2013-12-18 MED ORDER — FLEET ENEMA 7-19 GM/118ML RE ENEM: 1.0000 | ENEMA | RECTAL | Status: DC | PRN

## 2013-12-18 MED ORDER — LIDOCAINE HCL (PF) 1 % IJ SOLN
30.0000 mL | INTRAMUSCULAR | Status: DC | PRN
  Filled 2013-12-18: qty 30

## 2013-12-18 MED ORDER — CITRIC ACID-SODIUM CITRATE 334-500 MG/5ML PO SOLN: 30.0000 mL | ORAL | Status: DC | PRN

## 2013-12-18 MED ORDER — OXYTOCIN BOLUS FROM INFUSION: 500.0000 mL | INTRAVENOUS | Status: DC

## 2013-12-18 MED ORDER — FENTANYL 2.5 MCG/ML BUPIVACAINE 1/10 % EPIDURAL INFUSION (WH - ANES)
14.0000 mL/h | INTRAMUSCULAR | Status: DC | PRN
  Administered 2013-12-19 (×2): 14 mL/h via EPIDURAL
  Filled 2013-12-18 (×3): qty 125

## 2013-12-18 MED ORDER — EPHEDRINE 5 MG/ML INJ
10.0000 mg | INTRAVENOUS | Status: DC | PRN
  Filled 2013-12-18: qty 2

## 2013-12-18 NOTE — MAU Note (Signed)
Around 5p.m., started leaking clear fluid, still coming. No bleeding.  Some mucous d/c.  Little pain in the back.

## 2013-12-19 ENCOUNTER — Encounter (HOSPITAL_COMMUNITY): Payer: Self-pay | Admitting: *Deleted

## 2013-12-19 ENCOUNTER — Inpatient Hospital Stay (HOSPITAL_COMMUNITY): Payer: BC Managed Care – PPO | Admitting: Anesthesiology

## 2013-12-19 ENCOUNTER — Encounter (HOSPITAL_COMMUNITY): Payer: BC Managed Care – PPO | Admitting: Anesthesiology

## 2013-12-19 LAB — RPR: RPR Ser Ql: NONREACTIVE

## 2013-12-19 LAB — TYPE AND SCREEN
ABO/RH(D): O POS
ANTIBODY SCREEN: NEGATIVE

## 2013-12-19 MED ORDER — METHYLERGONOVINE MALEATE 0.2 MG/ML IJ SOLN
INTRAMUSCULAR | Status: AC
  Administered 2013-12-19: 0.25 mg
  Filled 2013-12-19: qty 1

## 2013-12-19 MED ORDER — BENZOCAINE-MENTHOL 20-0.5 % EX AERO
1.0000 "application " | INHALATION_SPRAY | CUTANEOUS | Status: DC | PRN
  Administered 2013-12-19 – 2013-12-20 (×2): 1 via TOPICAL
  Filled 2013-12-19 (×2): qty 56

## 2013-12-19 MED ORDER — ZOLPIDEM TARTRATE 5 MG PO TABS: 5.0000 mg | ORAL_TABLET | Freq: Every evening | ORAL | Status: DC | PRN

## 2013-12-19 MED ORDER — LANOLIN HYDROUS EX OINT: TOPICAL_OINTMENT | CUTANEOUS | Status: DC | PRN

## 2013-12-19 MED ORDER — METHYLERGONOVINE MALEATE 0.2 MG/ML IJ SOLN: 0.2000 mg | INTRAMUSCULAR | Status: DC | PRN

## 2013-12-19 MED ORDER — DIBUCAINE 1 % RE OINT: 1.0000 "application " | TOPICAL_OINTMENT | RECTAL | Status: DC | PRN

## 2013-12-19 MED ORDER — ONDANSETRON HCL 4 MG PO TABS: 4.0000 mg | ORAL_TABLET | ORAL | Status: DC | PRN

## 2013-12-19 MED ORDER — WITCH HAZEL-GLYCERIN EX PADS
1.0000 | MEDICATED_PAD | CUTANEOUS | Status: DC | PRN
Start: 2013-12-19 — End: 2013-12-21

## 2013-12-19 MED ORDER — TETANUS-DIPHTH-ACELL PERTUSSIS 5-2.5-18.5 LF-MCG/0.5 IM SUSP: 0.5000 mL | Freq: Once | INTRAMUSCULAR | Status: DC

## 2013-12-19 MED ORDER — PRENATAL MULTIVITAMIN CH
1.0000 | ORAL_TABLET | Freq: Every day | ORAL | Status: DC
  Administered 2013-12-20: 1 via ORAL
  Filled 2013-12-19: qty 1

## 2013-12-19 MED ORDER — SIMETHICONE 80 MG PO CHEW: 80.0000 mg | CHEWABLE_TABLET | ORAL | Status: DC | PRN

## 2013-12-19 MED ORDER — DIPHENHYDRAMINE HCL 25 MG PO CAPS: 25.0000 mg | ORAL_CAPSULE | Freq: Four times a day (QID) | ORAL | Status: DC | PRN

## 2013-12-19 MED ORDER — LIDOCAINE HCL (PF) 1 % IJ SOLN
INTRAMUSCULAR | Status: DC | PRN
  Administered 2013-12-19 (×4): 4 mL

## 2013-12-19 MED ORDER — IBUPROFEN 600 MG PO TABS
600.0000 mg | ORAL_TABLET | Freq: Four times a day (QID) | ORAL | Status: DC
  Administered 2013-12-19 – 2013-12-21 (×6): 600 mg via ORAL
  Filled 2013-12-19 (×6): qty 1

## 2013-12-19 MED ORDER — SENNOSIDES-DOCUSATE SODIUM 8.6-50 MG PO TABS
2.0000 | ORAL_TABLET | ORAL | Status: DC
  Administered 2013-12-20 (×2): 2 via ORAL
  Filled 2013-12-19 (×2): qty 2

## 2013-12-19 MED ORDER — OXYTOCIN 10 UNIT/ML IJ SOLN
INTRAMUSCULAR | Status: AC
  Administered 2013-12-19: 10 [IU]
  Filled 2013-12-19: qty 1

## 2013-12-19 MED ORDER — OXYCODONE-ACETAMINOPHEN 5-325 MG PO TABS
1.0000 | ORAL_TABLET | ORAL | Status: DC | PRN
Start: 2013-12-19 — End: 2013-12-21
  Administered 2013-12-19 – 2013-12-20 (×3): 1 via ORAL
  Filled 2013-12-19 (×3): qty 1

## 2013-12-19 MED ORDER — OXYTOCIN 40 UNITS IN LACTATED RINGERS INFUSION - SIMPLE MED: 62.5000 mL/h | INTRAVENOUS | Status: DC | PRN

## 2013-12-19 MED ORDER — ONDANSETRON HCL 4 MG/2ML IJ SOLN: 4.0000 mg | INTRAMUSCULAR | Status: DC | PRN

## 2013-12-19 NOTE — Anesthesia Procedure Notes (Signed)
Epidural Patient location during procedure: OB Start time: 12/19/2013 12:56 AM  Staffing Performed by: anesthesiologist   Preanesthetic Checklist Completed: patient identified, site marked, surgical consent, pre-op evaluation, timeout performed, IV checked, risks and benefits discussed and monitors and equipment checked  Epidural Patient position: sitting Prep: site prepped and draped and DuraPrep Patient monitoring: continuous pulse ox and blood pressure Approach: midline Injection technique: LOR air  Needle:  Needle type: Tuohy  Needle gauge: 17 G Needle length: 9 cm and 9 Needle insertion depth: 5.5 cm Catheter type: closed end flexible Catheter size: 19 Gauge Catheter at skin depth: 10.5 cm Test dose: negative  Assessment Events: blood not aspirated, injection not painful, no injection resistance, negative IV test and no paresthesia  Additional Notes Discussed risk of headache, infection, bleeding, nerve injury and failed or incomplete block.  Patient voices understanding and wishes to proceed.  Epidural placed easily on first attempt.  No paresthesia.  Patient tolerated procedure well with no apparent complications.  Jasmine DecemberA. Windy Dudek, MDReason for block:procedure for pain

## 2013-12-19 NOTE — Progress Notes (Signed)
Chelsea Castaneda is a 33 y.o. G3P2002 at 7453w0d by LMP admitted for induction of labor due to Spontaneous rupture of BOW.  Subjective:   Objective: BP 105/60  Pulse 81  Temp(Src) 98.2 F (36.8 C) (Oral)  Resp 16  Ht 5\' 2"  (1.575 m)  Wt 172 lb (78.019 kg)  BMI 31.45 kg/m2  LMP 05/20/2013 I/O last 3 completed shifts: In: -  Out: 350 [Urine:350]    FHT:  FHR: 115-130 bpm, variability: moderate,  accelerations:  Present,  decelerations:  Absent UC:   regular, every 4-7 minutes SVE:   Dilation: 5 Effacement (%): 90 Station: -1 Exam by:: lee  Labs: Lab Results  Component Value Date   WBC 7.7 12/18/2013   HGB 11.6* 12/18/2013   HCT 34.1* 12/18/2013   MCV 86.1 12/18/2013   PLT 151 12/18/2013    Assessment / Plan: Induction of labor due to PROM,  progressing well on pitocin  Labor: Progressing normally Preeclampsia:  n/a Fetal Wellbeing:  Category I Pain Control:  Epidural I/D:  n/a Anticipated MOD:  NSVD  HARPER,CHARLES A 12/19/2013, 2:26 PM

## 2013-12-19 NOTE — Progress Notes (Signed)
Chelsea Castaneda is a 33 y.o. G3P2002 at 9931w0d by ultrasound admitted for rupture of membranes  Subjective: Comfortable  Objective: BP 113/57  Pulse 125  Temp(Src) 98 F (36.7 C) (Oral)  Resp 16  Ht 5\' 2"  (1.575 m)  Wt 172 lb (78.019 kg)  BMI 31.45 kg/m2  LMP 05/20/2013   Total I/O In: -  Out: 350 [Urine:350]  FHT:  FHR: 140 bpm, variability: moderate,  accelerations:  Present,  decelerations:  Absent UC:   irregular, every 5 minutes SVE:   Dilation: 3.5 Effacement (%): 60 Station: -2 Exam by:: Dr.Jackson-Moore IUPC placed  Labs: Lab Results  Component Value Date   WBC 7.7 12/18/2013   HGB 11.6* 12/18/2013   HCT 34.1* 12/18/2013   MCV 86.1 12/18/2013   PLT 151 12/18/2013    Assessment / Plan: Protracted latent phase  Labor: see above; discontinue Pitocin x 1 hr-->restart; titrate to 200 MVU Preeclampsia:  n/a Fetal Wellbeing:  Category I Pain Control:  Epidural I/D:  n/a Anticipated MOD:  NSVD  JACKSON-MOORE,Ahmad Vanwey A 12/19/2013, 6:39 AM

## 2013-12-19 NOTE — H&P (Signed)
Chelsea Castaneda is a 33 y.o. female presenting for rupture of membranes. Maternal Medical History:  Reason for admission: Rupture of membranes.   Contractions: Onset was 6-12 hours ago.   Frequency: rare.    Fetal activity: Perceived fetal activity is normal.    Prenatal complications: no prenatal complications Prenatal Complications - Diabetes: none.    OB History   Grav Para Term Preterm Abortions TAB SAB Ect Mult Living   3 2 2       2      Past Medical History  Diagnosis Date  . Tuberculosis     inactive  . Blood transfusion without reported diagnosis     no infusion reaction   Past Surgical History  Procedure Laterality Date  . Dilation and curettage of uterus     Family History: family history includes Diabetes in her mother; Hypertension in her mother. Social History:  reports that she has never smoked. She has never used smokeless tobacco. She reports that she does not drink alcohol or use illicit drugs.     Review of Systems  Constitutional: Negative for fever.  Eyes: Negative for blurred vision.  Respiratory: Negative for shortness of breath.   Gastrointestinal: Negative for vomiting.  Skin: Negative for rash.  Neurological: Negative for headaches.   Cervix: 2/50% per RN Blood pressure 134/76, pulse 83, temperature 97.5 F (36.4 C), temperature source Oral, resp. rate 18, height 5\' 2"  (1.575 m), weight 172 lb (78.019 kg), last menstrual period 05/20/2013. Maternal Exam:  Abdomen: not evaluated.  Introitus: not evaluated.   Pelvis: adequate for delivery.   Cervix: Cervix evaluated by digital exam.     Fetal Exam Fetal Monitor Review: Variability: moderate (6-25 bpm).   Pattern: accelerations present and no decelerations.    Fetal State Assessment: Category I - tracings are normal.     Physical Exam  Constitutional: She appears well-developed.  HENT:  Head: Normocephalic.  Neck: Neck supple. No thyromegaly present.  Cardiovascular: Normal  rate and regular rhythm.   Respiratory: Breath sounds normal.  GI: Soft. Bowel sounds are normal.  Skin: No rash noted.    Prenatal labs: ABO, Rh: O/POS/-- (12/18 1437) Antibody: NEG (12/18 1437) Rubella: 1.58 (12/18 1437) RPR: NON REAC (01/05 1654)  HBsAg: NEGATIVE (12/18 1437)  HIV: NON REACTIVE (01/05 1654)  GBS: NEGATIVE (01/21 1620)   Assessment/Plan: Multipara @ 4645w0d.  PROM.  GBS negative.  Category I FHT  Admit Augment labor with low dose Pitocin per protocol Anticipate NSVD   JACKSON-MOORE,Holly Iannaccone A 12/19/2013, 12:34 AM

## 2013-12-19 NOTE — Progress Notes (Signed)
Keyaria Genevie Cheshirebou Ennassre is a 33 y.o. G3P2002 at 3944w0d by LMP admitted for rupture of membranes  Subjective:   Objective: BP 115/74  Pulse 96  Temp(Src) 98.4 F (36.9 C) (Oral)  Resp 16  Ht 5\' 2"  (1.575 m)  Wt 172 lb (78.019 kg)  BMI 31.45 kg/m2  LMP 05/20/2013 I/O last 3 completed shifts: In: -  Out: 350 [Urine:350]    FHT:  FHR: 120-130 bpm, variability: moderate,  accelerations:  Present,  decelerations:  Absent UC:   regular, every 2-5 minutes SVE:   Dilation: 9 Effacement (%): 100 Station: +1 Exam by:: lee  Labs: Lab Results  Component Value Date   WBC 7.7 12/18/2013   HGB 11.6* 12/18/2013   HCT 34.1* 12/18/2013   MCV 86.1 12/18/2013   PLT 151 12/18/2013    Assessment / Plan: Induction of labor due to PROM,  progressing well on pitocin  Labor: Progressing normally Preeclampsia:  n/a Fetal Wellbeing:  Category I Pain Control:  Epidural I/D:  n/a Anticipated MOD:  NSVD  HARPER,CHARLES A 12/19/2013, 3:54 PM

## 2013-12-19 NOTE — Anesthesia Preprocedure Evaluation (Signed)
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        Cardiovascular negative cardio ROS  Rhythm:regular Rate:Normal     Neuro/Psych negative neurological ROS  negative psych ROS   GI/Hepatic negative GI ROS, Neg liver ROS,   Endo/Other  negative endocrine ROS  Renal/GU negative Renal ROS     Musculoskeletal   Abdominal   Peds  Hematology H/o blood transfusion for PPH/retained products with second baby   Anesthesia Other Findings   Reproductive/Obstetrics (+) Pregnancy                           Anesthesia Physical Anesthesia Plan  ASA: II  Anesthesia Plan: Epidural   Post-op Pain Management:    Induction:   Airway Management Planned:   Additional Equipment:   Intra-op Plan:   Post-operative Plan:   Informed Consent: I have reviewed the patients History and Physical, chart, labs and discussed the procedure including the risks, benefits and alternatives for the proposed anesthesia with the patient or authorized representative who has indicated his/her understanding and acceptance.     Plan Discussed with:   Anesthesia Plan Comments:         Anesthesia Quick Evaluation

## 2013-12-20 ENCOUNTER — Encounter: Payer: BC Managed Care – PPO | Admitting: Obstetrics & Gynecology

## 2013-12-20 LAB — CBC
HEMATOCRIT: 31.8 % — AB (ref 36.0–46.0)
Hemoglobin: 10.6 g/dL — ABNORMAL LOW (ref 12.0–15.0)
MCH: 29 pg (ref 26.0–34.0)
MCHC: 33.3 g/dL (ref 30.0–36.0)
MCV: 86.9 fL (ref 78.0–100.0)
Platelets: 149 10*3/uL — ABNORMAL LOW (ref 150–400)
RBC: 3.66 MIL/uL — ABNORMAL LOW (ref 3.87–5.11)
RDW: 16.4 % — AB (ref 11.5–15.5)
WBC: 10.3 10*3/uL (ref 4.0–10.5)

## 2013-12-20 NOTE — Progress Notes (Signed)
Post Partum Day 1 Subjective: no complaints  Objective: Blood pressure 112/78, pulse 65, temperature 98.1 F (36.7 C), temperature source Oral, resp. rate 17, height 5\' 2"  (1.575 m), weight 172 lb (78.019 kg), last menstrual period 05/20/2013, SpO2 98.00%, unknown if currently breastfeeding.  Physical Exam:  General: alert and no distress Lochia: appropriate Uterine Fundus: firm Incision: healing well DVT Evaluation: No evidence of DVT seen on physical exam.   Recent Labs  12/18/13 2003 12/20/13 0555  HGB 11.6* 10.6*  HCT 34.1* 31.8*    Assessment/Plan: Plan for discharge tomorrow   LOS: 2 days   HARPER,CHARLES A 12/20/2013, 8:50 AM

## 2013-12-20 NOTE — Anesthesia Postprocedure Evaluation (Signed)
  Anesthesia Post-op Note  Patient: Chelsea Castaneda  Procedure(s) Performed: * No procedures listed *  Patient Location: PACU and Mother/Baby  Anesthesia Type:Epidural  Level of Consciousness: awake, alert  and oriented  Airway and Oxygen Therapy: Patient Spontanous Breathing  Post-op Pain: none  Post-op Assessment: Post-op Vital signs reviewed, Patient's Cardiovascular Status Stable, No headache, No backache, No residual numbness and No residual motor weakness  Post-op Vital Signs: Reviewed and stable  Complications: No apparent anesthesia complications

## 2013-12-21 MED ORDER — IBUPROFEN 600 MG PO TABS: 600.0000 mg | ORAL_TABLET | Freq: Four times a day (QID) | ORAL | Status: DC | PRN

## 2013-12-21 MED ORDER — OXYCODONE-ACETAMINOPHEN 5-325 MG PO TABS: 1.0000 | ORAL_TABLET | ORAL | Status: DC | PRN

## 2013-12-21 NOTE — Discharge Instructions (Signed)
Before Baby Comes Home °Ask any questions about feeding, diapering, and baby care before you leave the hospital. Ask again if you do not understand. Ask when you need to see the doctor again. °There are several things you must have before your baby comes home. °· Infant car seat. °· Crib. °· Do not let your baby sleep in a bed with you or anyone else. °· If you do not have a bed for your baby, ask the doctor what you can use that will be safe for the baby to sleep in. °Infant feeding supplies: °· 6 to 8 bottles (8 oz. size). °· 6 to 8 nipples. °· Measuring cup. °· Measuring tablespoon. °· Bottle brush. °· Sterilizer (or use any large pan or kettle with a lid). °· Formula that contains iron. °· A way to boil and cool water. °Breastfeeding supplies: °· Breast pump. °· Nipple cream. °Clothing: °· 24 to 36 cloth diapers and waterproof diaper covers or a box of disposable diapers. You may need as many as 10 to 12 diapers per day. °· 3 onesies (other clothing will depend on the time of year and the weather). °· 3 receiving blankets. °· 3 baby pajamas or gowns. °· 3 bibs. °Bath equipment: °· Mild soap. °· Petroleum jelly. No baby oil or powder. °· Soft cloth towel and wash cloth. °· Cotton balls. °· Separate bath basin for baby. Only sponge bathe until umbilical cord and circumcision are healed. °Other supplies: °· Thermometer and bulb syringe (ask the hospital to send them home with you). Ask your doctor about how you should take your baby's temperature. °· One to two pacifiers. °Prepare for an emergency: °· Know how to get to the hospital and know where to admit your baby. °· Put all doctor numbers near your house phone and in your cell phone if you have one. °Prepare your family: °· Talk with siblings about the baby coming home and how they feel about it. °· Decide how you want to handle visitors and other family members. °· Take offers for help with the baby. You will need time to adjust. °Know when to call the doctor.   °GET HELP RIGHT AWAY IF: °· Your baby's temperature is greater than 100.4° F (38° C). °· The softspot on your baby's head starts to bulge. °· Your baby is crying with no tears or has no wet diapers for 6 hours. °· Your baby has rapid breathing. °· Your baby is not as alert. °Document Released: 10/01/2008 Document Revised: 01/11/2012 Document Reviewed: 01/08/2011 °Exit[employer]® Patient Information ©2014 [employer]. ° °

## 2013-12-21 NOTE — Progress Notes (Signed)
Post Partum Day 2 Subjective: no complaints  Objective: Blood pressure 114/80, pulse 68, temperature 98.3 F (36.8 C), temperature source Oral, resp. rate 18, height 5\' 2"  (1.575 m), weight 172 lb (78.019 kg), last menstrual period 05/20/2013, SpO2 98.00%, unknown if currently breastfeeding.  Physical Exam:  General: alert and no distress Lochia: appropriate Uterine Fundus: firm Incision: healing well DVT Evaluation: No evidence of DVT seen on physical exam.   Recent Labs  12/18/13 2003 12/20/13 0555  HGB 11.6* 10.6*  HCT 34.1* 31.8*    Assessment/Plan: Discharge home   LOS: 3 days   Chelsea Castaneda A 12/21/2013, 7:14 AM

## 2013-12-21 NOTE — Progress Notes (Signed)
Pt discharged before CSW could assess reason for LPNC @ 30 weeks. CSW will continue to monitor drug screen results & make a referral if necessary.      

## 2013-12-21 NOTE — Discharge Summary (Signed)
Obstetric Discharge Summary Reason for Admission: rupture of membranes Prenatal Procedures: ultrasound Intrapartum Procedures: spontaneous vaginal delivery Postpartum Procedures: none Complications-Operative and Postpartum: none Hemoglobin  Date Value Ref Range Status  12/20/2013 10.6* 12.0 - 15.0 g/dL Final  4/09/81193/27/2014 14.714.1  12.2 - 16.2 g/dL Final     HCT  Date Value Ref Range Status  12/20/2013 31.8* 36.0 - 46.0 % Final     HCT, POC  Date Value Ref Range Status  01/26/2013 43.7  37.7 - 47.9 % Final    Physical Exam:  General: alert and no distress Lochia: appropriate Uterine Fundus: firm Incision: healing well DVT Evaluation: No evidence of DVT seen on physical exam.  Discharge Diagnoses: Term Pregnancy-delivered  Discharge Information: Date: 12/21/2013 Activity: pelvic rest Diet: routine Medications: PNV, Ibuprofen, Colace and Percocet Condition: stable Instructions: refer to practice specific booklet Discharge to: home Follow-up Information   Follow up with Antionette CharJACKSON-MOORE,LISA A, MD In 2 weeks.   Specialty:  Obstetrics and Gynecology   Contact information:   [address] [place] [id number] [phone]       Newborn Data: Live born female  Birth Weight: 7 lb 9.2 oz (3436 g) APGAR: , 9  Home with mother.  Raelynn Corron A 12/21/2013, 7:18 AM

## 2013-12-23 ENCOUNTER — Inpatient Hospital Stay (HOSPITAL_COMMUNITY)
Admission: AD | Admit: 2013-12-23 | Discharge: 2013-12-23 | Disposition: A | Discharge status: Home / Self Care | Payer: BC Managed Care – PPO | Source: Ambulatory Visit | Attending: Obstetrics | Admitting: Obstetrics

## 2013-12-23 ENCOUNTER — Encounter (HOSPITAL_COMMUNITY): Payer: Self-pay | Admitting: *Deleted

## 2013-12-23 DIAGNOSIS — O9279 Other disorders of lactation: Secondary | ICD-10-CM | POA: Insufficient documentation

## 2013-12-23 DIAGNOSIS — O9212 Cracked nipple associated with the puerperium: Secondary | ICD-10-CM

## 2013-12-23 DIAGNOSIS — R109 Unspecified abdominal pain: Secondary | ICD-10-CM | POA: Insufficient documentation

## 2013-12-23 LAB — URINALYSIS, ROUTINE W REFLEX MICROSCOPIC
Bilirubin Urine: NEGATIVE
Glucose, UA: NEGATIVE mg/dL
KETONES UR: NEGATIVE mg/dL
Nitrite: NEGATIVE
PROTEIN: NEGATIVE mg/dL
Specific Gravity, Urine: 1.015 (ref 1.005–1.030)
Urobilinogen, UA: 0.2 mg/dL (ref 0.0–1.0)
pH: 6.5 (ref 5.0–8.0)

## 2013-12-23 LAB — CBC
HEMATOCRIT: 29 % — AB (ref 36.0–46.0)
HEMOGLOBIN: 9.7 g/dL — AB (ref 12.0–15.0)
MCH: 29 pg (ref 26.0–34.0)
MCHC: 33.4 g/dL (ref 30.0–36.0)
MCV: 86.6 fL (ref 78.0–100.0)
Platelets: 206 10*3/uL (ref 150–400)
RBC: 3.35 MIL/uL — ABNORMAL LOW (ref 3.87–5.11)
RDW: 16.5 % — ABNORMAL HIGH (ref 11.5–15.5)
WBC: 5.3 10*3/uL (ref 4.0–10.5)

## 2013-12-23 LAB — URINE MICROSCOPIC-ADD ON

## 2013-12-23 MED ORDER — METHYLERGONOVINE MALEATE 0.2 MG PO TABS: 0.2000 mg | ORAL_TABLET | Freq: Three times a day (TID) | ORAL | Status: DC

## 2013-12-23 NOTE — MAU Note (Signed)
Lactation notified of pt needing to be evaluated .

## 2013-12-23 NOTE — Discharge Instructions (Signed)
Breast Engorgement  °Breast engorgement is the overfilling of your breasts with breast milk. In the first few weeks after giving birth, you may experience breast engorgement. Although it is normal for your breasts to feel heavy, full, and uncomfortable within 3 5 days of giving birth, breast engorgement can make your breasts throb and feel hard, tightly stretched, warm, and tender. Engorgement peaks about the fifth day after you give birth. Breast engorgement can be easily treated and does not require you to stop breastfeeding.  °CAUSES OF BREAST ENGORGEMENT °Some women delay feedings because of sore or cracked nipples, which can lead to engorgement. Cracked and sore nipples often are caused by inadequate latching (when your baby's mouth attaches to your breast to breastfeed). If your baby is latched on properly, he or she should be able to breastfeed as long as needed, without causing any pain. If you do feel pain while breastfeeding, take your baby off your breast and try again. Get help from your health care provider or a lactation consultant if you continue to have pain. °Other causes of engorgement include:  °· Improper position of your baby while breastfeeding. °· Allowing too much time to pass between feedings. °· Reduction in breastfeeding because you give your baby water, juice, formula, breast milk from a bottle, or a pacifier instead of breastfeeding. °· Changes in your baby's feeding patterns. °· Weak sucking from your baby, which causes less milk to be taken out of your breast during feedings. °· Fatigue, stress, anemia. °· Plugged milk ducts. °· A history of breast surgery. °SIGNS AND SYMPTOMS OF BREAST ENGORGEMENT °If your breasts become engorged, you may experience:  °· Breast swelling, tenderness, warmth, redness, or throbbing. °· Breast hardness and stretching of the skin around your breast. °· Flattening, tightening, and hardening of your nipple. °· A low-grade fever, which can be confused with a  breast infection. °RECOMMENDATIONS TO EASE BREAST ENGORGEMENT °Breast engorgement should improve in 24 48 hours after following these recommendations: °· Breastfeed when you feel the need to reduce the fullness of your breasts or when your baby shows signs of hunger. This is called "breastfeeding on demand." °· Newborns (babies younger than 4 weeks) often breastfeed every 1 3 hours during the day. You may need to awaken your baby to feed if he or she is asleep at a feeding time. °· Do not allow your baby to sleep longer than 5 hours during the night without a feeding. °· Pump or hand-express breast milk before breastfeeding to soften your breast, areola, and nipple.   °· Apply warm, moist heat (in the shower or with warm water-soaked hand towels) just before feeding or pumping, or massage your breast before or during breastfeeding. This increases circulation and helps your milk to flow. °· Completely empty your breasts when breastfeeding or pumping. Afterward, wear a snug bra (nursing or regular) or tank top for 1 2 days to signal your body to slightly decrease milk production. Only wear snug bras or tank tops to treat engorgement. Tight bras typically should be avoided by breastfeeding mothers. Once engorgement is relieved, return to wearing regular, loose-fitting clothes. °· Apply ice packs to your breasts to lessen the pain from engorgement and relieve swelling, unless the ice is uncomfortable for you. °· Do not delay feedings. Try to relax when it is time to feed your baby. This helps to trigger your "let-down reflex," which releases milk from your breast. °· Ensure your baby is latched on to your breast and positioned properly   while breastfeeding.   Allow your baby to remain at your breast as long as he or she is latched on well and actively sucking. Your baby will let you know when he or she is done breastfeeding by pulling away from your breast or falling asleep.  Avoid introducing bottles or pacifiers  to your baby in the early weeks of breastfeeding. Wait to introduce these things until after resolving any breastfeeding challenges.  Try to pump your milk on the same schedule as when your baby would breastfeed if you are returning to work or away from home for an extended period.  Drink plenty of fluids to avoid dehydration, which can eventually put you at greater risk of breast engorgement.  CALL YOUR HEALTH CARE PROVIDER OR LACTATION CONSULTANT IF:   Engorgement lasts longer than 2 days, even after treatment.  You have flu-like symptoms, such as a fever, chills, or body aches.  Your breasts become increasingly red and painful. Document Released: 02/13/2005 Document Revised: 06/21/2013 Document Reviewed: 04/13/2013 [facility] Patient Information 2014 [place].  Breastfeeding Challenges and Solutions Even though breastfeeding is natural, it can be challenging, especially in the first few weeks after childbirth. It is normal for problems to arise when starting to breastfeed your new baby, even if you have breastfed before. This document provides some solutions to the most common breastfeeding challenges.  CHALLENGES AND SOLUTIONS Challenge Cracked or Sore Nipples Cracked or sore nipples are commonly experienced by breastfeeding mothers. Cracked or sore nipples often are caused by inadequate latching (when your baby's mouth attaches to your breast to breastfeed). Soreness can also happen if your baby is not positioned properly at your breast. Although nipple cracking and soreness are common during the first week after birth, nipple pain is never normal. If you experience nipple cracking or soreness that lasts longer than 1 week or nipple pain, call your health care provider or lactation consultant.  Solution Ensure proper latching and positioning of your baby by following the steps below:  Find a comfortable place to sit or lie down, with your neck and back well supported.  Place a  pillow or rolled up blanket under your baby to bring him or her to the level of your breast (if you are seated).  Make sure that your baby's abdomen is facing your abdomen.  Gently massage your breast. With your fingertips, massage from your chest wall toward your nipple in a circular motion. This encourages milk flow. You may need to continue this action during the feeding if your milk flows slowly.  Support your breast with 4 fingers underneath and your thumb above your nipple. Make sure your fingers are well away from your nipple and your baby's mouth.  Stroke your baby's lips gently with your finger or nipple.  When your baby's mouth is open wide enough, quickly bring your baby to your breast, placing your entire nipple and as much of the colored area around your nipple (areola) as possible into your baby's mouth.  More areola should be visible above your baby's upper lip than below the lower lip.  Your baby's tongue should be between his or her lower gum and your breast.  Ensure that your baby's mouth is correctly positioned around your nipple (latched). Your baby's lips should create a seal on your breast and be turned out (everted).  It is common for your baby to suck for about 2 3 minutes in order to start the flow of breast milk. Signs that your baby has  successfully latched on to your nipple include:   Quietly tugging or quietly sucking without causing you pain.   Swallowing heard between every 3 4 sucks.   Muscle movement above and in front of his or her ears with sucking.  Signs that your baby has not successfully latched on to nipple include:   Sucking sounds or smacking sounds from your baby while nursing.   Nipple pain.  Ensure that your breasts stay moisturized and healthy by:  Avoiding the use of soap on your nipples.   Wearing a supportive bra. Avoid wearing under wire style bras or tight bras.   Air drying your nipples for 3 4 minutes after each feeding.    Using only cotton bra pads to absorb breast milk leakage. Leaking of breast milk between feedings is normal. Be sure to change the pads if they become soaked with milk.  Using lanolin on your nipples after nursing. Lanolin helps to maintain your skin's normal moisture barrier. If you use pure lanolin you do not need to wash it off before feeding your baby again. Pure lanolin is not toxic to your baby. You may also hand express a few drops of breast milk and gently massage that milk into your nipples, allowing it to air dry. Challenge Breast Engorgement Breast engorgement is the overfilling of your breasts with breast milk. In the first few weeks after giving birth, you may experience breast engorgement. Breast engorgement can make your breasts throb and feel hard, tightly stretched, warm, and tender. Engorgement peaks about the fifth day after you give birth. Having breast engorgement does not mean you have to stop breastfeeding your baby. Solution  Breastfeed when you feel the need to reduce the fullness of your breasts or when your baby shows signs of hunger. This is called "breastfeeding on demand."  Newborns (babies younger than 4 weeks) often breastfeed every 1 3 hours during the day. You may need to awaken your baby to feed if he or she is asleep at a feeding time.  Do not allow your baby to sleep longer than 5 hours during the night without a feeding.  Pump or hand express breast milk before breastfeeding to soften your breast, areola, and nipple.  Apply warm, moist heat (in the shower or with warm water-soaked hand towels) just before feeding or pumping, or massage your breast before or during breastfeeding. This increases circulation and helps your milk to flow.  Completely empty your breasts when breastfeeding or pumping. Afterward, wear a snug bra (nursing or regular) or tank top for 1 2 days to signal your body to slightly decrease milk production. Only wear snug bras or tank tops  to treat engorgement. Tight bras typically should be avoided by breastfeeding mothers. Once engorgement is relieved, return to wearing regular, loose-fitting clothes.  Apply ice packs to your breasts to lessen the pain from engorgement and relieve swelling, unless the ice is uncomfortable for you.  Do not delay feedings. Try to relax when it is time to feed your baby. This helps to trigger your "let-down reflex," which releases milk from your breast.  Ensure your baby is latched on to your breast and positioned properly while breastfeeding.  Allow your baby to remain at your breast as long as he or she is latched on well and actively sucking. Your baby will let you know when he or she is done breastfeeding by pulling away from your breast or falling asleep.  Avoid introducing bottles or pacifiers to your baby  in the early weeks of breastfeeding. Wait to introduce these things until after resolving any breastfeeding challenges.  Try to pump your milk on the same schedule as when your baby would breastfeed if you are returning to work or away from home for an extended period.  Drink plenty of fluids to avoid dehydration, which can eventually put you at greater risk of breast engorgement. If you follow these suggestions, your engorgement should improve in 24 48 hours. If you are still experiencing difficulty, call your lactation consultant or health care provider.  Challenge Plugged Milk Ducts Plugged milk ducts occur when the duct does not drain milk effectively and becomes swollen. Wearing a tight-fitting nursing bra or having difficulty with latching may cause plugged milk ducts. Not drinking enough water (8 10 c [1.9 2.4 L] per day) can contribute to plugged milk ducts. Once a duct has become plugged, hard lumps, soreness, and redness may develop in your breast.  Solution Do not delay feedings. Feed your baby frequently and try to empty your breasts of milk at each feeding. Try breastfeeding from  the affected side first so there is a better chance that the milk will drain completely from that breast. Apply warm, moist towels to your breasts for 5 10 minutes before feeding. Alternatively, a hot shower right before breastfeeding can provide the moist heat that can encourage milk flow. Gentle massage of the sore area before and during a feeding may also help. Avoid wearing tight clothing or bras that put pressure on your breasts. Wear bras that offer good support to your breasts, but avoid underwire bras. If you have a plugged milk duct and develop a fever, you need to see your health care provider.  Challenge Mastitis Mastitis is inflammation of your breast. It usually is caused by a bacterial infection and can cause flu-like symptoms. You may develop redness in your breast and a fever. Often when mastitis occurs, your breast becomes firm, warm, and very painful. The most common causes of mastitis are poor latching, ineffective sucking from your baby, consistent pressure on your breast (possibly from wearing a tight-fitting bra or shirt that restricts the milk flow), unusual stress or fatigue, or missed feedings.  Solution You will be given antibiotic medicine to treat the infection. It is still important to breastfeed frequently to empty your breasts. Continuing to breastfeed while you recover from mastitis will not harm your baby. Make sure your baby is positioned properly during every feeding. Apply moist heat to your breasts for a few minutes before feeding to help the milk flow and to help your breasts empty more easily. Challenge Camelia Phenes is a yeast infection that can form on your nipples, in your breast, or in your baby's mouth. It causes itching, soreness, burning or stabbing pain, and sometimes a rash.  Solution You will be given a medicated ointment for your nipples, and your baby will be given a liquid medicine for his or her mouth. It is important that you and your baby are treated at  the same time because thrush can be passed between you and your baby. Change disposable nursing pads often. Any bras, towels, or clothing that come in contact with infected areas of your body or your baby's body need to be washed in very hot water every day. Wash your hands and your baby's hands often. All pacifiers, bottle nipples, or toys your baby puts in his or her mouth should be boiled once a day for 20 minutes. After 1 week  of treatment, discard pacifiers and bottle nipples and buy new ones. All breast pump parts that touch the milk need to be boiled for 20 minutes every day. Challenge Low Milk Supply You may not be producing enough milk if your baby is not gaining the proper amount of weight. Breast milk production is based on a supply-and-demand system. Your milk supply depends on how frequently and effectively your baby empties your breast. Solution The more you breastfeed and pump, the more breast milk you will produce. It is important that your baby empties at least one of your breasts at each feeding. If this is not happening, then use a breast pump or hand express any milk that remains. This will help to drain as much milk as possible at each feeding. It will also signal your body to produce more milk. If your baby is not emptying your breasts, it may be due to latching, sucking, or positioning problems. If low milk supply continues after addressing these issues, contact your health care provider or a lactation specialist as soon as possible. Challenge Inverted or Flat Nipples Some women have nipples that turn inward instead of protruding outward. Other women have nipples that are flat. Inverted or flat nipples can sometimes make it more difficult for your baby to latch onto your breast. Solution You may be given a small device that pulls out inverted nipples. This device should be applied right before your baby is brought to your breast. You can also try using a breast pump for a short time  before placing the baby at your breast. The pump can pull your nipple outwards to help your infant latch more easily. The baby's sucking motion will help the inverted nipple protrude as well.  If you have flat nipples, encourage your baby to latch onto your breast and feed frequently in the early days after birth. This will give your baby practice latching on correctly while your breast is still soft. When your milk supply increases, between the second and fifth day after birth and your breasts become full, your baby will have an easier time latching.  Contact a lactation consultant if you still have concerns. She or he can teach you additional techniques to address breastfeeding problems related to nipple shape and position.  FOR MORE INFORMATION [employer]: www.llli.org Document Released: 04/12/2006 Document Revised: 06/21/2013 Document Reviewed: 04/14/2013 [facility] Patient Information 2014 [place], [place].

## 2013-12-23 NOTE — MAU Note (Signed)
Pt  Presents with complaints of lower abdominal pain since her delivery on February the 17th. She states that the pain comes and goes but she in concerned that she has retained products because she did with her last pregnancy and felt the same way

## 2013-12-23 NOTE — MAU Provider Note (Signed)
CC: Postpartum Complications    First Provider Initiated Contact with Patient 12/23/13 1315      HPI Chelsea Castaneda is a 33 y.o. G3P3003 now 5 days post uncomplicated NSVD who presents with onset yesterday of interittent lower abdominal cramping pains. Pain mostly in left groin with position changing and also at rest at times. She is concerned she may have retained POC since she did have RPOC shortly after last delivery. Lochia is dark red and tapering, no passage of clots or tissue. Having left breast engorgement, bilateral pain with lactation and cracked, bleeding nipples.  Has been taking ibuprofen and occasional Percocet. Has F/U appointment at 2 wks.     Past Medical History  Diagnosis Date  . Tuberculosis     inactive  . Blood transfusion without reported diagnosis     no infusion reaction    OB History  Gravida Para Term Preterm AB SAB TAB Ectopic Multiple Living  3 3 3       3     # Outcome Date GA Lbr Len/2nd Weight Sex Delivery Anes PTL Lv  3 TRM 12/19/13 [redacted]w[redacted]d 05:13 / 00:40 3.436 kg (7 lb 9.2 oz) F SVD EPI  Y  2 TRM 02/18/11 [redacted]w[redacted]d  2.863 kg (6 lb 5 oz) M SVD EPI N Y  1 TRM 12/22/09 [redacted]w[redacted]d  3.629 kg (8 lb) M SVD EPI N       Past Surgical History  Procedure Laterality Date  . Dilation and curettage of uterus    RPOC with P2  History   Social History  . Marital Status: Married    Spouse Name: N/A    Number of Children: N/A  . Years of Education: N/A   Occupational History  . Not on file.   Social History Main Topics  . Smoking status: Never Smoker   . Smokeless tobacco: Never Used  . Alcohol Use: No  . Drug Use: No  . Sexual Activity: Yes   Other Topics Concern  . Not on file   Social History Narrative  . No narrative on file    No current facility-administered medications on file prior to encounter.   Current Outpatient Prescriptions on File Prior to Encounter  Medication Sig Dispense Refill  . ibuprofen (ADVIL,MOTRIN) 600 MG tablet Take 1  tablet (600 mg total) by mouth every 6 (six) hours as needed.  30 tablet  5  . oxyCODONE-acetaminophen (PERCOCET/ROXICET) 5-325 MG per tablet Take 1-2 tablets by mouth every 4 (four) hours as needed for severe pain (moderate - severe pain).  40 tablet  0  . Prenat-FeCbn-FeAspGl-FA-Omega (OB COMPLETE PETITE) 35-5-1-200 MG CAPS Take 1 capsule by mouth daily.  30 capsule  11  . fluconazole (DIFLUCAN) 150 MG tablet Take 1 tablet (150 mg total) by mouth once.  1 tablet  2  . [DISCONTINUED] gabapentin (NEURONTIN) 100 MG capsule Take 1 capsule (100 mg total) by mouth 3 (three) times daily.  30 capsule  0    No Known Allergies  ROS Pertinent items in HPI  PHYSICAL EXAM Filed Vitals:   12/23/13 1127  BP: 120/67  Pulse: 76  Temp: 98.2 F (36.8 C)  Resp: 18   General: Well nourished, well developed female in no acute distress Cardiovascular: Normal rate Respiratory: Normal effort Abdomen: Soft, nontender except TTP left groin Back: No CVAT Extremities: No edema Neurologic: Alert and oriented Bimanual exam: scant dark blood. Uterus mobile NT, involuting normally  LAB RESULTS Results for orders placed during the hospital  encounter of 12/23/13 (from the past 24 hour(s))  URINALYSIS, ROUTINE W REFLEX MICROSCOPIC     Status: Abnormal   Collection Time    12/23/13 11:10 AM      Result Value Ref Range   Color, Urine YELLOW  YELLOW   APPearance CLEAR  CLEAR   Specific Gravity, Urine 1.015  1.005 - 1.030   pH 6.5  5.0 - 8.0   Glucose, UA NEGATIVE  NEGATIVE mg/dL   Hgb urine dipstick LARGE (*) NEGATIVE   Bilirubin Urine NEGATIVE  NEGATIVE   Ketones, ur NEGATIVE  NEGATIVE mg/dL   Protein, ur NEGATIVE  NEGATIVE mg/dL   Urobilinogen, UA 0.2  0.0 - 1.0 mg/dL   Nitrite NEGATIVE  NEGATIVE   Leukocytes, UA SMALL (*) NEGATIVE  URINE MICROSCOPIC-ADD ON     Status: Abnormal   Collection Time    12/23/13 11:10 AM      Result Value Ref Range   Squamous Epithelial / LPF FEW (*) RARE   WBC, UA 3-6   <3 WBC/hpf   RBC / HPF 21-50  <3 RBC/hpf   Bacteria, UA FEW (*) RARE  CBC     Status: Abnormal   Collection Time    12/23/13 12:17 PM      Result Value Ref Range   WBC 5.3  4.0 - 10.5 K/uL   RBC 3.35 (*) 3.87 - 5.11 MIL/uL   Hemoglobin 9.7 (*) 12.0 - 15.0 g/dL   HCT 16.129.0 (*) 09.636.0 - 04.546.0 %   MCV 86.6  78.0 - 100.0 fL   MCH 29.0  26.0 - 34.0 pg   MCHC 33.4  30.0 - 36.0 g/dL   RDW 40.916.5 (*) 81.111.5 - 91.415.5 %   Platelets 206  150 - 400 K/uL    IMAGING No results found.  MAU COURSE Pt and partner asking for US, though I explained lochia normal and uterus involuted, NT so not needed. Offrred methergine course. C/W Dr. Clearance CootsHarper: advises against US Lactation Consultant worked with patient while here> see note  ASSESSMENT  1. Cracked nipple, obstetric, delivered, with postpartum complication   2. Breast engorgement, obstetric, delivered with postpartum condition   Normal uterine involution 5 days PP  PLAN Discharge home. See AVS for patient education.    Medication List         fluconazole 150 MG tablet  Commonly known as:  DIFLUCAN  Take 1 tablet (150 mg total) by mouth once.     ibuprofen 600 MG tablet  Commonly known as:  ADVIL,MOTRIN  Take 1 tablet (600 mg total) by mouth every 6 (six) hours as needed.     methylergonovine 0.2 MG tablet  Commonly known as:  METHERGINE  Take 1 tablet (0.2 mg total) by mouth 3 (three) times daily.     OB COMPLETE PETITE 35-5-1-200 MG Caps  Take 1 capsule by mouth daily.     oxyCODONE-acetaminophen 5-325 MG per tablet  Commonly known as:  PERCOCET/ROXICET  Take 1-2 tablets by mouth every 4 (four) hours as needed for severe pain (moderate - severe pain).       Follow-up Information   Follow up with HARPER,CHARLES A, MD In 2 weeks.   Specialty:  Obstetrics and Gynecology   Contact information:   [address] [place] [address] [phone]        Danae Orleanseirdre C Andree Heeg, CNM 12/23/2013 1:20 PM

## 2013-12-23 NOTE — Lactation Note (Signed)
Lactation Consultation Note  Patient Name: Chelsea ChingSafaa Abou Ennassre ZOXWR'UToday's Date: 12/23/2013   Pt came to MAU with c/o abdominal pain.  Infant is 475 days old and is present with mom in room along with FOB.  LC called to rent pump due to sore nipples and engorgement.  Upon entering room mom was pumping with #24 flange; flange size too small so LC increased to #30 flange which fit without pain.  Mom pumped 80 ml from left breast and 220+ from right breast.  Left breast still firm feeling after pumping and right was soft.  Noted blood tinged milk from left breast and 2+ inch string of curdled milk from left nipple.  Encouraged mom to ice prior to pumping and warm compress during pumping along with massaging and expression during pumping to increase milk output.  Both nipples are cracked with scabs noted; left nipple more painful than right.  LC asked mom to latch infant so LC could see the latch.  Mom let infant self-latch in cradle hold with an unsupported breast on left side.  LC taught mom how to latch using cross-cradle hold and asymetrical latching technique; reviewed flanging of lips and assuring depth.  Could not get baby on left breast with comfort so suggested to mom to pump left side for the next few feedings after latching to right.  [facility]  [facility] to get [facility] DEBP and explained the need to call WIC this week to get pump.  Patient instruction for Engorgement Education Sheet given to mom and verbally reviewed with mom for understanding.  Encouraged to continue taking ibuprofen to decrease inflammation and assist with milk output.  Mom verbalized understanding of teaching.  Encouraged to call for questions or for outpatient appointment if needed.  Spoke with RN and NP about assessment and interventions completed by LC.   Lendon KaVann, Doyel Mulkern Walker 12/23/2013, 1:59 PM

## 2013-12-26 ENCOUNTER — Encounter (HOSPITAL_COMMUNITY): Payer: Self-pay | Admitting: *Deleted

## 2013-12-27 ENCOUNTER — Inpatient Hospital Stay (HOSPITAL_COMMUNITY)
Admission: AD | Admit: 2013-12-27 | Discharge: 2013-12-27 | Disposition: A | Discharge status: Home / Self Care | Payer: BC Managed Care – PPO | Source: Ambulatory Visit | Attending: Obstetrics | Admitting: Obstetrics

## 2013-12-27 ENCOUNTER — Inpatient Hospital Stay (HOSPITAL_COMMUNITY): Payer: BC Managed Care – PPO

## 2013-12-27 DIAGNOSIS — O26899 Other specified pregnancy related conditions, unspecified trimester: Secondary | ICD-10-CM

## 2013-12-27 DIAGNOSIS — R52 Pain, unspecified: Secondary | ICD-10-CM

## 2013-12-27 DIAGNOSIS — O99893 Other specified diseases and conditions complicating puerperium: Secondary | ICD-10-CM | POA: Insufficient documentation

## 2013-12-27 DIAGNOSIS — R1031 Right lower quadrant pain: Secondary | ICD-10-CM | POA: Insufficient documentation

## 2013-12-27 DIAGNOSIS — O9989 Other specified diseases and conditions complicating pregnancy, childbirth and the puerperium: Principal | ICD-10-CM

## 2013-12-27 DIAGNOSIS — O9089 Other complications of the puerperium, not elsewhere classified: Secondary | ICD-10-CM

## 2013-12-27 DIAGNOSIS — O909 Complication of the puerperium, unspecified: Secondary | ICD-10-CM

## 2013-12-27 LAB — URINALYSIS, ROUTINE W REFLEX MICROSCOPIC
Bilirubin Urine: NEGATIVE
GLUCOSE, UA: NEGATIVE mg/dL
KETONES UR: NEGATIVE mg/dL
Nitrite: NEGATIVE
PROTEIN: NEGATIVE mg/dL
Specific Gravity, Urine: >1.03 — ABNORMAL HIGH (ref 1.005–1.030)
Urobilinogen, UA: 0.2 mg/dL (ref 0.0–1.0)
pH: 5.5 (ref 5.0–8.0)

## 2013-12-27 LAB — URINE MICROSCOPIC-ADD ON

## 2013-12-27 LAB — CBC
HCT: 31.3 % — ABNORMAL LOW (ref 36.0–46.0)
HEMOGLOBIN: 10.3 g/dL — AB (ref 12.0–15.0)
MCH: 28.5 pg (ref 26.0–34.0)
MCHC: 32.9 g/dL (ref 30.0–36.0)
MCV: 86.5 fL (ref 78.0–100.0)
Platelets: 281 10*3/uL (ref 150–400)
RBC: 3.62 MIL/uL — ABNORMAL LOW (ref 3.87–5.11)
RDW: 16.5 % — ABNORMAL HIGH (ref 11.5–15.5)
WBC: 9 10*3/uL (ref 4.0–10.5)

## 2013-12-27 LAB — POCT PREGNANCY, URINE: Preg Test, Ur: POSITIVE — AB

## 2013-12-27 NOTE — MAU Provider Note (Signed)
History     CSN: [id number]  Arrival date and time: 12/27/13 [id number]   First Provider Initiated Contact with Patient 12/27/13 0403      Chief Complaint  Patient presents with  . Abdominal Pain  . Postpartum Complications   HPI [name] is a 33 y.o. G3P3003 who presents to MAU today PPD #8 complaining or RLQ pain. The patient states she continues to have mild bleeding. She denies abnormal discharge, N/V/D or constipation or fever today. She was seen here on 12/23/13 and found to have some engorgement and was consulted by lactation. Patient states at that time she had LLQ pain as well that has resolved. She is concerned because she had retained POC after her last pregnancy and states it "felt the same."   OB History   Grav Para Term Preterm Abortions TAB SAB Ect Mult Living   3 3 3       3       Past Medical History  Diagnosis Date  . Tuberculosis     inactive  . Blood transfusion without reported diagnosis     no infusion reaction    Past Surgical History  Procedure Laterality Date  . Dilation and curettage of uterus      Family History  Problem Relation Age of Onset  . Diabetes Mother   . Hypertension Mother     History  Substance Use Topics  . Smoking status: Never Smoker   . Smokeless tobacco: Never Used  . Alcohol Use: No    Allergies: No Known Allergies  Prescriptions prior to admission  Medication Sig Dispense Refill  . fluconazole (DIFLUCAN) 150 MG tablet Take 1 tablet (150 mg total) by mouth once.  1 tablet  2  . ibuprofen (ADVIL,MOTRIN) 600 MG tablet Take 1 tablet (600 mg total) by mouth every 6 (six) hours as needed.  30 tablet  5  . methylergonovine (METHERGINE) 0.2 MG tablet Take 1 tablet (0.2 mg total) by mouth 3 (three) times daily.  6 tablet  0  . oxyCODONE-acetaminophen (PERCOCET/ROXICET) 5-325 MG per tablet Take 1-2 tablets by mouth every 4 (four) hours as needed for severe pain (moderate - severe pain).  40 tablet  0  .  Prenat-FeCbn-FeAspGl-FA-Omega (OB COMPLETE PETITE) 35-5-1-200 MG CAPS Take 1 capsule by mouth daily.  30 capsule  11    Review of Systems  Constitutional: Negative for fever, chills and malaise/fatigue.  Gastrointestinal: Positive for abdominal pain. Negative for nausea, vomiting, diarrhea and constipation.  Genitourinary: Negative for dysuria, urgency and frequency.       + vaginal bleeding Neg - vaginal discharge   Physical Exam   Blood pressure 122/63, pulse 72, temperature 98.4 F (36.9 C), temperature source Oral, resp. rate 16, height 5\' 3"  (1.6 m), weight 164 lb (74.39 kg), currently breastfeeding.  Physical Exam  Constitutional: She is oriented to person, place, and time. She appears well-developed and well-nourished. No distress.  HENT:  Head: Normocephalic and atraumatic.  Cardiovascular: Normal rate.   Respiratory: Effort normal.  GI: Soft. She exhibits no distension and no mass. There is tenderness (mild tenderness to palpation of the lower abdomen at midline and RLQ). There is no rebound and no guarding.  Neurological: She is alert and oriented to person, place, and time.  Skin: Skin is warm and dry. No erythema.  Psychiatric: She has a normal mood and affect.   Results for orders placed during the hospital encounter of 12/27/13 (from the past 24 hour(s))  POCT  PREGNANCY, URINE     Status: Abnormal   Collection Time    12/27/13  3:53 AM      Result Value Ref Range   Preg Test, Ur POSITIVE (*) NEGATIVE  URINALYSIS, ROUTINE W REFLEX MICROSCOPIC     Status: Abnormal   Collection Time    12/27/13  3:58 AM      Result Value Ref Range   Color, Urine YELLOW  YELLOW   APPearance CLEAR  CLEAR   Specific Gravity, Urine >1.030 (*) 1.005 - 1.030   pH 5.5  5.0 - 8.0   Glucose, UA NEGATIVE  NEGATIVE mg/dL   Hgb urine dipstick LARGE (*) NEGATIVE   Bilirubin Urine NEGATIVE  NEGATIVE   Ketones, ur NEGATIVE  NEGATIVE mg/dL   Protein, ur NEGATIVE  NEGATIVE mg/dL   Urobilinogen,  UA 0.2  0.0 - 1.0 mg/dL   Nitrite NEGATIVE  NEGATIVE   Leukocytes, UA MODERATE (*) NEGATIVE  URINE MICROSCOPIC-ADD ON     Status: Abnormal   Collection Time    12/27/13  3:58 AM      Result Value Ref Range   Squamous Epithelial / LPF FEW (*) RARE   WBC, UA 7-10  <3 WBC/hpf   RBC / HPF 11-20  <3 RBC/hpf   Bacteria, UA FEW (*) RARE  CBC     Status: Abnormal   Collection Time    12/27/13  4:18 AM      Result Value Ref Range   WBC 9.0  4.0 - 10.5 K/uL   RBC 3.62 (*) 3.87 - 5.11 MIL/uL   Hemoglobin 10.3 (*) 12.0 - 15.0 g/dL   HCT 16.1 (*) 09.6 - 04.5 %   MCV 86.5  78.0 - 100.0 fL   MCH 28.5  26.0 - 34.0 pg   MCHC 32.9  30.0 - 36.0 g/dL   RDW 40.9 (*) 81.1 - 91.4 %   Platelets 281  150 - 400 K/uL   US Transvaginal Non-ob  12/27/2013   CLINICAL DATA:  Pelvic pain, right worse than left.  EXAM: TRANSABDOMINAL AND TRANSVAGINAL ULTRASOUND OF PELVIS  TECHNIQUE: Both transabdominal and transvaginal ultrasound examinations of the pelvis were performed. Transabdominal technique was performed for global imaging of the pelvis including uterus, ovaries, adnexal regions, and pelvic cul-de-sac. It was necessary to proceed with endovaginal exam following the transabdominal exam to visualize the uterus and ovaries in greater detail.  COMPARISON:  Pelvic ultrasound performed 05/07/2012  FINDINGS: Uterus  Measurements: 13.1 x 8.2 x 11.1 cm. No fibroids or other mass visualized.  Endometrium  Thickness: 2.9 cm. Diffusely thickened, with a large amount of complex fluid and clot noted in the endometrial canal. No associated blood flow is noted on limited color Doppler evaluation.  Right ovary  Measurements: 4.3 x 2.6 x 2.4 cm. Normal appearance/no adnexal mass.  Left ovary  Measurements: 3.4 x 2.7 x 2.3 cm. Normal appearance/no adnexal mass.  Other findings  No free fluid is seen within the pelvic cul-de-sac.  IMPRESSION: 1. No acute abnormality seen to explain the patient's symptoms. 2. Large amount of complex  fluid and clot noted in the endometrial canal. No evidence of retained products of conception.   Electronically Signed   By: Roanna Raider M.D.   On: 12/27/2013 06:09   US Pelvis Complete  12/27/2013   CLINICAL DATA:  Pelvic pain, right worse than left.  EXAM: TRANSABDOMINAL AND TRANSVAGINAL ULTRASOUND OF PELVIS  TECHNIQUE: Both transabdominal and transvaginal ultrasound examinations of the pelvis were performed.  Transabdominal technique was performed for global imaging of the pelvis including uterus, ovaries, adnexal regions, and pelvic cul-de-sac. It was necessary to proceed with endovaginal exam following the transabdominal exam to visualize the uterus and ovaries in greater detail.  COMPARISON:  Pelvic ultrasound performed 05/07/2012  FINDINGS: Uterus  Measurements: 13.1 x 8.2 x 11.1 cm. No fibroids or other mass visualized.  Endometrium  Thickness: 2.9 cm. Diffusely thickened, with a large amount of complex fluid and clot noted in the endometrial canal. No associated blood flow is noted on limited color Doppler evaluation.  Right ovary  Measurements: 4.3 x 2.6 x 2.4 cm. Normal appearance/no adnexal mass.  Left ovary  Measurements: 3.4 x 2.7 x 2.3 cm. Normal appearance/no adnexal mass.  Other findings  No free fluid is seen within the pelvic cul-de-sac.  IMPRESSION: 1. No acute abnormality seen to explain the patient's symptoms. 2. Large amount of complex fluid and clot noted in the endometrial canal. No evidence of retained products of conception.   Electronically Signed   By: Roanna Raider M.D.   On: 12/27/2013 06:09     MAU Course  Procedures None  MDM CBC, UA today Afebrile, WBCs - WNL, patient denies abnormal discharge and is very mildly tender on exam Abdominal pain seems to be normal for this time PP Discussed with patient and her husband that this pain is most likely normal for PP period. Patient's husband is very angry and addiment that she needs an [place] to prove everything is ok because  of the history of Retained POC with last pregnancy.  [place] ordered - no evidence of retained POC Assessment and Plan  A: PP abdominal pain   P: Discharge home Patient advised to continue previously prescribed pain medications PRN Patient encouraged to follow-up with Dr. Clearance Coots as scheduled Urine culture pending Patient may return to MAU as needed or if her condition were to change or worsen  Freddi Starr, PA-C  12/27/2013, 4:32 AM

## 2013-12-27 NOTE — MAU Note (Signed)
Pt post vaginal delivery 12/19/2013, having lower abd pain greater on the right side. Pt seen in MAU on Sunday for LLQ pain.

## 2013-12-27 NOTE — Discharge Instructions (Signed)

## 2013-12-28 LAB — URINE CULTURE

## 2014-01-11 ENCOUNTER — Encounter: Payer: Self-pay | Admitting: Obstetrics

## 2014-01-11 ENCOUNTER — Ambulatory Visit (INDEPENDENT_AMBULATORY_CARE_PROVIDER_SITE_OTHER): Payer: Medicaid Other | Admitting: Obstetrics

## 2014-01-11 DIAGNOSIS — Z3009 Encounter for other general counseling and advice on contraception: Secondary | ICD-10-CM

## 2014-01-11 DIAGNOSIS — R309 Painful micturition, unspecified: Secondary | ICD-10-CM

## 2014-01-11 DIAGNOSIS — R300 Dysuria: Secondary | ICD-10-CM

## 2014-01-11 LAB — POCT URINALYSIS DIPSTICK
Bilirubin, UA: NEGATIVE
Glucose, UA: NEGATIVE
KETONES UA: NEGATIVE
Nitrite, UA: NEGATIVE
SPEC GRAV UA: 1.02
UROBILINOGEN UA: NEGATIVE
pH, UA: 5

## 2014-01-11 NOTE — Progress Notes (Signed)
Subjective:     Tarika Genevie Cheshirebou Ennassre is a 33 y.o. female who presents for a postpartum visit. She is 3 weeks postpartum following a spontaneous vaginal delivery. I have fully reviewed the prenatal and intrapartum course. The delivery was at 38 gestational weeks. Outcome: spontaneous vaginal delivery. Anesthesia: epidural. Postpartum course has been WNL. Baby's course has been WNL. Baby is feeding by breast. Bleeding staining only. Bowel function is normal. Bladder function is normal. Patient is not sexually active. Contraception method is abstinence. Postpartum depression screening: negative.  The following portions of the patient's history were reviewed and updated as appropriate: allergies, current medications, past family history, past medical history, past social history, past surgical history and problem list.  Review of Systems Pertinent items are noted in HPI.   Objective:    Breastfeeding? Yes  General:  alert and no distress PE:  Deferred   Assessment:    3 weeks postpartum.  Doing well.  Counseling for contraception.  Plan:    1. Contraception: IUD 2. Mirena IUD Rx 3. Follow up in: 6 weeks or as needed.

## 2014-01-12 LAB — URINE CULTURE
Colony Count: NO GROWTH
ORGANISM ID, BACTERIA: NO GROWTH

## 2014-02-08 ENCOUNTER — Ambulatory Visit (INDEPENDENT_AMBULATORY_CARE_PROVIDER_SITE_OTHER): Payer: BC Managed Care – PPO | Admitting: Emergency Medicine

## 2014-02-08 VITALS — BP 112/62 | HR 79 | Temp 98.0°F | Resp 16 | Ht 63.0 in | Wt 147.0 lb

## 2014-02-08 DIAGNOSIS — Z331 Pregnant state, incidental: Secondary | ICD-10-CM

## 2014-02-08 DIAGNOSIS — R42 Dizziness and giddiness: Secondary | ICD-10-CM

## 2014-02-08 DIAGNOSIS — J018 Other acute sinusitis: Secondary | ICD-10-CM

## 2014-02-08 DIAGNOSIS — R509 Fever, unspecified: Secondary | ICD-10-CM

## 2014-02-08 DIAGNOSIS — E559 Vitamin D deficiency, unspecified: Secondary | ICD-10-CM

## 2014-02-08 DIAGNOSIS — Z3490 Encounter for supervision of normal pregnancy, unspecified, unspecified trimester: Secondary | ICD-10-CM

## 2014-02-08 DIAGNOSIS — R110 Nausea: Secondary | ICD-10-CM

## 2014-02-08 LAB — POCT URINALYSIS DIPSTICK
GLUCOSE UA: NEGATIVE
NITRITE UA: NEGATIVE
RBC UA: NEGATIVE
Spec Grav, UA: >=1.03
Urobilinogen, UA: 0.2
pH, UA: 5.5

## 2014-02-08 LAB — POCT UA - MICROSCOPIC ONLY
Casts, Ur, LPF, POC: NEGATIVE
Crystals, Ur, HPF, POC: NEGATIVE
Mucus, UA: POSITIVE
Yeast, UA: NEGATIVE

## 2014-02-08 LAB — POCT URINE PREGNANCY: Preg Test, Ur: POSITIVE

## 2014-02-08 MED ORDER — TRIAMCINOLONE ACETONIDE 55 MCG/ACT NA AERO: 2.0000 | INHALATION_SPRAY | Freq: Every day | NASAL | Status: DC

## 2014-02-08 MED ORDER — OB COMPLETE PETITE 35-5-1-200 MG PO CAPS: 1.0000 | ORAL_CAPSULE | Freq: Every day | ORAL | Status: DC

## 2014-02-08 MED ORDER — AMOXICILLIN-POT CLAVULANATE 875-125 MG PO TABS: 1.0000 | ORAL_TABLET | Freq: Two times a day (BID) | ORAL | Status: DC

## 2014-02-08 NOTE — Patient Instructions (Signed)

## 2014-02-08 NOTE — Addendum Note (Signed)
Addended by: Nita SellsSMITH, Genna Casimir S on: 02/08/2014 04:38 PM   Modules accepted: Orders

## 2014-02-08 NOTE — Progress Notes (Signed)
[facility] [address], [place] [address] [phone]  Date:  02/08/2014   Name:  Chelsea ChingSafaa Abou Castaneda   DOB:  09-24-1981   MRN:  [id number]  PCP:  No PCP Per Patient    Chief Complaint: Headache, Fever, Dizziness, Nausea and Chills   History of Present Illness:  Chelsea Castaneda is a 33 y.o. very pleasant female patient who presents with the following:  2 months post partum.  Noticed a fever yesterday associated with nasal pressure and pressure in cheeks and forehead.  No sore throat or cough.  No nausea or vomiting.  No wheezing or shortness of breath.  No dysuria, urgency or frequency.  Sexually active and concerned she may be pregnant.  No improvement with over the counter medications or other home remedies. Denies other complaint or health concern today.   Patient Active Problem List   Diagnosis Date Noted  . Pain with urination 01/11/2014  . Normal delivery 12/19/2013  . Preterm premature rupture of membranes (PPROM) delivered, current hospitalization 12/18/2013  . Unspecified vitamin D deficiency 10/27/2013  . Supervision of other normal pregnancy 10/19/2013    Past Medical History  Diagnosis Date  . Tuberculosis     inactive  . Blood transfusion without reported diagnosis     no infusion reaction    Past Surgical History  Procedure Laterality Date  . Dilation and curettage of uterus      History  Substance Use Topics  . Smoking status: Never Smoker   . Smokeless tobacco: Never Used  . Alcohol Use: No    Family History  Problem Relation Age of Onset  . Diabetes Mother   . Hypertension Mother     No Known Allergies  Medication list has been reviewed and updated.  Current Outpatient Prescriptions on File Prior to Visit  Medication Sig Dispense Refill  . Prenat-FeCbn-FeAspGl-FA-Omega (OB COMPLETE PETITE) 35-5-1-200 MG CAPS Take 1 capsule by mouth daily.  30 capsule  11  . ibuprofen (ADVIL,MOTRIN) 600 MG tablet Take 1 tablet  (600 mg total) by mouth every 6 (six) hours as needed.  30 tablet  5  . [DISCONTINUED] gabapentin (NEURONTIN) 100 MG capsule Take 1 capsule (100 mg total) by mouth 3 (three) times daily.  30 capsule  0   No current facility-administered medications on file prior to visit.    Review of Systems:  As per HPI, otherwise negative.    Physical Examination: Filed Vitals:   02/08/14 1520  BP: 112/62  Pulse: 79  Temp: 98 F (36.7 C)  Resp: 16   Filed Vitals:   02/08/14 1520  Height: 5\' 3"  (1.6 m)  Weight: 147 lb (66.679 kg)   Body mass index is 26.05 kg/(m^2). Ideal Body Weight: Weight in (lb) to have BMI = 25: 140.8  GEN: WDWN, NAD, Non-toxic, A & O x 3 HEENT: Atraumatic, Normocephalic. Neck supple. No masses, No LAD. Ears and Nose: No external deformity.  Purulent nasal drainage. CV: RRR, No M/G/R. No JVD. No thrill. No extra heart sounds. PULM: CTA B, no wheezes, crackles, rhonchi. No retractions. No resp. distress. No accessory muscle use. ABD: S, NT, ND, +BS. No rebound. No HSM. EXTR: No c/c/e NEURO Normal gait.  PSYCH: Normally interactive. Conversant. Not depressed or anxious appearing.  Calm demeanor.    Assessment and Plan: Sinusitis augmentin nasacort Pregnancy  Signed,  Phillips OdorJeffery Rossanna Spitzley, MD   Results for orders placed in visit on 02/08/14  POCT URINALYSIS DIPSTICK  Result Value Ref Range   Color, UA amber     Clarity, UA cloudy     Glucose, UA negative     Bilirubin, UA small     Ketones, UA trace     Spec Grav, UA >=1.030     Blood, UA negative     pH, UA 5.5     Protein, UA trace     Urobilinogen, UA 0.2     Nitrite, UA negative     Leukocytes, UA Trace    POCT UA - MICROSCOPIC ONLY      Result Value Ref Range   WBC, Ur, HPF, POC 1-4     RBC, urine, microscopic 0-1     Bacteria, U Microscopic trace     Mucus, UA positive     Epithelial cells, urine per micros 2-5     Crystals, Ur, HPF, POC negative     Casts, Ur, LPF, POC negative      Yeast, UA negative    POCT URINE PREGNANCY      Result Value Ref Range   Preg Test, Ur Positive

## 2014-02-09 LAB — HCG, QUANTITATIVE, PREGNANCY

## 2014-02-12 ENCOUNTER — Telehealth: Payer: Self-pay | Admitting: *Deleted

## 2014-02-12 NOTE — Telephone Encounter (Signed)
Pt called for pregancy results, Chelsea Castaneda reviewed. Blood work neg, advised to f/u with OBGYN.

## 2014-02-22 ENCOUNTER — Ambulatory Visit: Payer: BC Managed Care – PPO | Admitting: Obstetrics

## 2014-03-10 ENCOUNTER — Ambulatory Visit (INDEPENDENT_AMBULATORY_CARE_PROVIDER_SITE_OTHER): Payer: BC Managed Care – PPO | Admitting: Family Medicine

## 2014-03-10 ENCOUNTER — Ambulatory Visit: Payer: BC Managed Care – PPO

## 2014-03-10 VITALS — BP 102/79 | HR 64 | Temp 98.1°F | Resp 16 | Ht 62.5 in | Wt 154.0 lb

## 2014-03-10 DIAGNOSIS — N926 Irregular menstruation, unspecified: Secondary | ICD-10-CM

## 2014-03-10 DIAGNOSIS — N939 Abnormal uterine and vaginal bleeding, unspecified: Secondary | ICD-10-CM

## 2014-03-10 DIAGNOSIS — H109 Unspecified conjunctivitis: Secondary | ICD-10-CM

## 2014-03-10 DIAGNOSIS — Z3200 Encounter for pregnancy test, result unknown: Secondary | ICD-10-CM

## 2014-03-10 DIAGNOSIS — M25569 Pain in unspecified knee: Secondary | ICD-10-CM

## 2014-03-10 DIAGNOSIS — M25559 Pain in unspecified hip: Secondary | ICD-10-CM

## 2014-03-10 DIAGNOSIS — N912 Amenorrhea, unspecified: Secondary | ICD-10-CM

## 2014-03-10 DIAGNOSIS — M25551 Pain in right hip: Secondary | ICD-10-CM

## 2014-03-10 DIAGNOSIS — M25561 Pain in right knee: Secondary | ICD-10-CM

## 2014-03-10 LAB — POCT URINE PREGNANCY: Preg Test, Ur: NEGATIVE

## 2014-03-10 MED ORDER — MELOXICAM 7.5 MG PO TABS: 7.5000 mg | ORAL_TABLET | Freq: Two times a day (BID) | ORAL | Status: DC | PRN

## 2014-03-10 MED ORDER — OFLOXACIN 0.3 % OP SOLN: 2.0000 [drp] | Freq: Four times a day (QID) | OPHTHALMIC | Status: DC

## 2014-03-10 NOTE — Progress Notes (Signed)
Chief Complaint:  Chief Complaint  Patient presents with  . Hip Pain    right   . Knee Pain    right  . puffy eyes    bilateral  . Possible Pregnancy    previous positive test    HPI: Chelsea Castaneda is a 33 y.o. female who is here for :  1. Right hip and knee pain for the last 1 month,  she feels there is a catch in the hip that radiates down and she also feels  the same catch on the lateral side of her knee. There is NKI, this started 1 month ago in the knee and longer for the hip pain. No numbness or tingling.  No weaknesss. No fever or chills. She has to wait and rebalance, takes about 20-30 min before she can ambulate normally again. Has tried no medicine for it.   2. She is also here because her eyes have been bialteraly red, her children both have conjunctivitis and she was tempted to use  drops rx to her children but wants to get her own , cursty and red eyes, no visions changes, draiaing clear, no itchiness, no history of allergies, no light sensitivy, no injury  3. Has had + pregnancy test in the recent past and wants tog et checked again here, has LMP 2 weeks ago but very minimal. No fever or chills or nausea/vomiting/abd pain   Past Medical History  Diagnosis Date  . Tuberculosis     inactive  . Blood transfusion without reported diagnosis     no infusion reaction   Past Surgical History  Procedure Laterality Date  . Dilation and curettage of uterus     History   Social History  . Marital Status: Married    Spouse Name: N/A    Number of Children: N/A  . Years of Education: N/A   Social History Main Topics  . Smoking status: Never Smoker   . Smokeless tobacco: Never Used  . Alcohol Use: No  . Drug Use: No  . Sexual Activity: Not Currently   Other Topics Concern  . None   Social History Narrative  . None   Family History  Problem Relation Age of Onset  . Diabetes Mother   . Hypertension Mother    No Known Allergies Prior to  Admission medications   Medication Sig Start Date End Date Taking? Authorizing Provider  amoxicillin-clavulanate (AUGMENTIN) 875-125 MG per tablet Take 1 tablet by mouth 2 (two) times daily. 02/08/14   Phillips OdorJeffery Anderson, MD  ibuprofen (ADVIL,MOTRIN) 600 MG tablet Take 1 tablet (600 mg total) by mouth every 6 (six) hours as needed. 12/21/13   Brock Badharles A Harper, MD  Prenat-FeCbn-FeAspGl-FA-Omega (OB COMPLETE PETITE) 35-5-1-200 MG CAPS Take 1 capsule by mouth daily. 02/08/14   Phillips OdorJeffery Anderson, MD  triamcinolone (NASACORT AQ) 55 MCG/ACT AERO nasal inhaler Place 2 sprays into the nose daily. 02/08/14   Phillips OdorJeffery Anderson, MD     ROS: The patient denies fevers, chills, night sweats, unintentional weight loss, chest pain, palpitations, wheezing, dyspnea on exertion, nausea, vomiting, abdominal pain, dysuria, hematuria, melena, numbness, weakness, or tingling.   All other systems have been reviewed and were otherwise negative with the exception of those mentioned in the HPI and as above.    PHYSICAL EXAM: Filed Vitals:   03/10/14 1002  BP: 102/79  Pulse: 64  Temp: 98.1 F (36.7 C)  Resp: 16   Filed Vitals:   03/10/14 1002  Height: 5' 2.5" (1.588 m)  Weight: 154 lb (69.854 kg)   Body mass index is 27.7 kg/(m^2).  General: Alert, no acute distress HEENT:  Normocephalic, atraumatic, oropharynx patent. EOMI, PERRLA, + conjunctivitis, clear dc Cardiovascular:  Regular rate and rhythm, no rubs murmurs or gallops.  No Carotid bruits, radial pulse intact. No pedal edema.  Respiratory: Clear to auscultation bilaterally.  No wheezes, rales, or rhonchi.  No cyanosis, no use of accessory musculature GI: No organomegaly, abdomen is soft and non-tender, positive bowel sounds.  No masses. Skin: No rashes. Neurologic: Facial musculature symmetric. Psychiatric: Patient is appropriate throughout our interaction. Lymphatic: No cervical lymphadenopathy Musculoskeletal: Gait intact. Right hip-normal ROM, 5/5  strength, tender on deep palpation Lumbar spine- Full ROM 5/5 strength, 2/2 DTRs No saddle anesthesia Straight leg negative Right knee normal ROM, no crepitus, no defotieis, neg lachman, neg jt line tenderness, + LCL tenderness on varus    LABS: Results for orders placed in visit on 03/10/14  POCT URINE PREGNANCY      Result Value Ref Range   Preg Test, Ur Negative       EKG/XRAY:   [facility] + arthritis , right hip vs osteophyte Knee normal   ASSESSMENT/PLAN: Encounter Diagnoses  Name Primary?  . Possible pregnancy Yes  . Amenorrhea   . Right hip pain   . Right knee pain   . Conjunctivitis    Rx Ocuflox Rx Mobic ( not breatfeeding) Refer to ortho at patient's request Otc Naphcon prn if allergies and antibiotic drops don't help F/u prn  Gross sideeffects, risk and benefits, and alternatives of medications d/w patient. Patient is aware that all medications have potential sideeffects and we are unable to predict every sideeffect or drug-drug interaction that may occur.  Chelsea Antuhao P Avaya Mcjunkins, DO 03/10/2014 1:03 PM

## 2014-03-10 NOTE — Patient Instructions (Addendum)
Allergic Conjunctivitis ( MAY TAKE OTC NAPHCON ANTIHISTAMINE DROPS) The conjunctiva is a thin membrane that covers the visible white part of the eyeball and the underside of the eyelids. This membrane protects and lubricates the eye. The membrane has small blood vessels running through it that can normally be seen. When the conjunctiva becomes inflamed, the condition is called conjunctivitis. In response to the inflammation, the conjunctival blood vessels become swollen. The swelling results in redness in the normally white part of the eye. The blood vessels of this membrane also react when a person has allergies and is then called allergic conjunctivitis. This condition usually lasts for as long as the allergy persists. Allergic conjunctivitis cannot be passed to another person (non-contagious). The likelihood of bacterial infection is great and the cause is not likely due to allergies if the inflamed eye has:  A sticky discharge.  Discharge or sticking together of the lids in the morning.  Scaling or flaking of the eyelids where the eyelashes come out.  Red swollen eyelids. CAUSES   Viruses.  Irritants such as foreign bodies.  Chemicals.  General allergic reactions.  Inflammation or serious diseases in the inside or the outside of the eye or the orbit (the boney cavity in which the eye sits) can cause a "red eye." SYMPTOMS   Eye redness.  Tearing.  Itchy eyes.  Burning feeling in the eyes.  Clear drainage from the eye.  Allergic reaction due to pollens or ragweed sensitivity. Seasonal allergic conjunctivitis is frequent in the spring when pollens are in the air and in the fall. DIAGNOSIS  This condition, in its many forms, is usually diagnosed based on the history and an ophthalmological exam. It usually involves both eyes. If your eyes react at the same time every year, allergies may be the cause. While most "red eyes" are due to allergy or an infection, the role of an eye  (ophthalmological) exam is important. The exam can rule out serious diseases of the eye or orbit. TREATMENT   Non-antibiotic eye drops, ointments, or medications by mouth may be prescribed if the ophthalmologist is sure the conjunctivitis is due to allergies alone.  Over-the-counter drops and ointments for allergic symptoms should be used only after other causes of conjunctivitis have been ruled out, or as your caregiver suggests. Medications by mouth are often prescribed if other allergy-related symptoms are present. If the ophthalmologist is sure that the conjunctivitis is due to allergies alone, treatment is normally limited to drops or ointments to reduce itching and burning. HOME CARE INSTRUCTIONS   Wash hands before and after applying drops or ointments, or touching the inflamed eye(s) or eyelids.  Do not let the eye dropper tip or ointment tube touch the eyelid when putting medicine in your eye.  Stop using your soft contact lenses and throw them away. Use a new pair of lenses when recovery is complete. You should run through sterilizing cycles at least three times before use after complete recovery if the old soft contact lenses are to be used. Hard contact lenses should be stopped. They need to be thoroughly sterilized before use after recovery.  Itching and burning eyes due to allergies is often relieved by using a cool cloth applied to closed eye(s). SEEK MEDICAL CARE IF:   Your problems do not go away after two or three days of treatment.  Your lids are sticky (especially in the morning when you wake up) or stick together.  Discharge develops. Antibiotics may be needed either as  drops, ointment, or by mouth.  You have extreme light sensitivity.  An oral temperature above 102 F (38.9 C) develops.  Pain in or around the eye or any other visual symptom develops. MAKE SURE YOU:   Understand these instructions.  Will watch your condition.  Will get help right away if you  are not doing well or get worse. Document Released: 01/09/2003 Document Revised: 01/11/2012 Document Reviewed: 12/05/2007 [facility] Patient Information 2014 [place], [place]. Conjunctivitis Conjunctivitis is commonly called "pink eye." Conjunctivitis can be caused by bacterial or viral infection, allergies, or injuries. There is usually redness of the lining of the eye, itching, discomfort, and sometimes discharge. There may be deposits of matter along the eyelids. A viral infection usually causes a watery discharge, while a bacterial infection causes a yellowish, thick discharge. Pink eye is very contagious and spreads by direct contact. You may be given antibiotic eyedrops as part of your treatment. Before using your eye medicine, remove all drainage from the eye by washing gently with warm water and cotton balls. Continue to use the medication until you have awakened 2 mornings in a row without discharge from the eye. Do not rub your eye. This increases the irritation and helps spread infection. Use separate towels from other household members. Wash your hands with soap and water before and after touching your eyes. Use cold compresses to reduce pain and sunglasses to relieve irritation from light. Do not wear contact lenses or wear eye makeup until the infection is gone. SEEK MEDICAL CARE IF:   Your symptoms are not better after 3 days of treatment.  You have increased pain or trouble seeing.  The outer eyelids become very red or swollen. Document Released: 11/26/2004 Document Revised: 01/11/2012 Document Reviewed: 10/19/2005 [facility] Patient Information 2014 [place], [place]. Arthritis, Nonspecific Arthritis is inflammation of a joint. This usually means pain, redness, warmth or swelling are present. One or more joints may be involved. There are a number of types of arthritis. Your caregiver may not be able to tell what type of arthritis you have right away. CAUSES  The most common cause of  arthritis is the wear and tear on the joint (osteoarthritis). This causes damage to the cartilage, which can break down over time. The knees, hips, back and neck are most often affected by this type of arthritis. Other types of arthritis and common causes of joint pain include:  Sprains and other injuries near the joint. Sometimes minor sprains and injuries cause pain and swelling that develop hours later.  Rheumatoid arthritis. This affects hands, feet and knees. It usually affects both sides of your body at the same time. It is often associated with chronic ailments, fever, weight loss and general weakness.  Crystal arthritis. Gout and pseudo gout can cause occasional acute severe pain, redness and swelling in the foot, ankle, or knee.  Infectious arthritis. Bacteria can get into a joint through a break in overlying skin. This can cause infection of the joint. Bacteria and viruses can also spread through the blood and affect your joints.  Drug, infectious and allergy reactions. Sometimes joints can become mildly painful and slightly swollen with these types of illnesses. SYMPTOMS   Pain is the main symptom.  Your joint or joints can also be red, swollen and warm or hot to the touch.  You may have a fever with certain types of arthritis, or even feel overall ill.  The joint with arthritis will hurt with movement. Stiffness is present with some types of  arthritis. DIAGNOSIS  Your caregiver will suspect arthritis based on your description of your symptoms and on your exam. Testing may be needed to find the type of arthritis:  Blood and sometimes urine tests.  X-ray tests and sometimes CT or MRI scans.  Removal of fluid from the joint (arthrocentesis) is done to check for bacteria, crystals or other causes. Your caregiver (or a specialist) will numb the area over the joint with a local anesthetic, and use a needle to remove joint fluid for examination. This procedure is only minimally  uncomfortable.  Even with these tests, your caregiver may not be able to tell what kind of arthritis you have. Consultation with a specialist (rheumatologist) may be helpful. TREATMENT  Your caregiver will discuss with you treatment specific to your type of arthritis. If the specific type cannot be determined, then the following general recommendations may apply. Treatment of severe joint pain includes:  Rest.  Elevation.  Anti-inflammatory medication (for example, ibuprofen) may be prescribed. Avoiding activities that cause increased pain.  Only take over-the-counter or prescription medicines for pain and discomfort as recommended by your caregiver.  Cold packs over an inflamed joint may be used for 10 to 15 minutes every hour. Hot packs sometimes feel better, but do not use overnight. Do not use hot packs if you are diabetic without your caregiver's permission.  A cortisone shot into arthritic joints may help reduce pain and swelling.  Any acute arthritis that gets worse over the next 1 to 2 days needs to be looked at to be sure there is no joint infection. Long-term arthritis treatment involves modifying activities and lifestyle to reduce joint stress jarring. This can include weight loss. Also, exercise is needed to nourish the joint cartilage and remove waste. This helps keep the muscles around the joint strong. HOME CARE INSTRUCTIONS   Do not take aspirin to relieve pain if gout is suspected. This elevates uric acid levels.  Only take over-the-counter or prescription medicines for pain, discomfort or fever as directed by your caregiver.  Rest the joint as much as possible.  If your joint is swollen, keep it elevated.  Use crutches if the painful joint is in your leg.  Drinking plenty of fluids may help for certain types of arthritis.  Follow your caregiver's dietary instructions.  Try low-impact exercise such as:  Swimming.  Water  aerobics.  Biking.  Walking.  Morning stiffness is often relieved by a warm shower.  Put your joints through regular range-of-motion. SEEK MEDICAL CARE IF:   You do not feel better in 24 hours or are getting worse.  You have side effects to medications, or are not getting better with treatment. SEEK IMMEDIATE MEDICAL CARE IF:   You have a fever.  You develop severe joint pain, swelling or redness.  Many joints are involved and become painful and swollen.  There is severe back pain and/or leg weakness.  You have loss of bowel or bladder control. Document Released: 11/26/2004 Document Revised: 01/11/2012 Document Reviewed: 12/12/2008 [facility]ExitCare Patient Information 2014 [place]ExitCare, [place]LL[place].

## 2014-03-15 ENCOUNTER — Ambulatory Visit: Payer: Self-pay | Admitting: Obstetrics & Gynecology

## 2014-04-27 ENCOUNTER — Ambulatory Visit (INDEPENDENT_AMBULATORY_CARE_PROVIDER_SITE_OTHER): Payer: BC Managed Care – PPO | Admitting: Obstetrics & Gynecology

## 2014-04-27 ENCOUNTER — Encounter: Payer: Self-pay | Admitting: Obstetrics & Gynecology

## 2014-04-27 VITALS — BP 116/77 | HR 66 | Temp 98.7°F | Ht 60.5 in

## 2014-04-27 DIAGNOSIS — Z30430 Encounter for insertion of intrauterine contraceptive device: Secondary | ICD-10-CM

## 2014-04-27 DIAGNOSIS — Z3202 Encounter for pregnancy test, result negative: Secondary | ICD-10-CM

## 2014-04-27 LAB — POCT URINE PREGNANCY: Preg Test, Ur: NEGATIVE

## 2014-04-27 MED ORDER — LEVONORGESTREL 20 MCG/24HR IU IUD
INTRAUTERINE_SYSTEM | Freq: Once | INTRAUTERINE | Status: AC
  Administered 2014-04-27: 17:00:00 via INTRAUTERINE

## 2014-04-27 NOTE — Patient Instructions (Signed)

## 2014-04-27 NOTE — Progress Notes (Signed)
IUD Insertion Procedure Note  Pre-operative Diagnosis: Requests a LARC   Post-operative Diagnosis: same  Indications: contraception  Procedure Details  Urine pregnancy test was done and result was negative.  The risks (including infection, bleeding, pain, and uterine perforation) and benefits of the procedure were explained to the patient and Written informed consent was obtained.    Cervix cleansed with Betadine. Uterus sounded to 7 cm. IUD inserted without difficulty. String visible and trimmed. Patient tolerated procedure well.  IUD Information: Mirena.  Condition: Stable  Complications: None  Plan:  The patient was advised to call for any fever or for prolonged or severe pain or bleeding. She was advised to use OTC analgesics as needed for mild to moderate pain.

## 2014-04-28 ENCOUNTER — Emergency Department (HOSPITAL_COMMUNITY)
Admission: EM | Admit: 2014-04-28 | Discharge: 2014-04-28 | Disposition: A | Discharge status: Home / Self Care | Payer: Medicaid Other | Attending: Emergency Medicine | Admitting: Emergency Medicine

## 2014-04-28 ENCOUNTER — Encounter (HOSPITAL_COMMUNITY): Payer: Self-pay | Admitting: Emergency Medicine

## 2014-04-28 DIAGNOSIS — R519 Headache, unspecified: Secondary | ICD-10-CM

## 2014-04-28 DIAGNOSIS — H53149 Visual discomfort, unspecified: Secondary | ICD-10-CM | POA: Insufficient documentation

## 2014-04-28 DIAGNOSIS — Z8611 Personal history of tuberculosis: Secondary | ICD-10-CM | POA: Insufficient documentation

## 2014-04-28 DIAGNOSIS — R51 Headache: Secondary | ICD-10-CM | POA: Insufficient documentation

## 2014-04-28 MED ORDER — SODIUM CHLORIDE 0.9 % IV BOLUS (SEPSIS)
1000.0000 mL | Freq: Once | INTRAVENOUS | Status: AC
  Administered 2014-04-28: 1000 mL via INTRAVENOUS

## 2014-04-28 MED ORDER — METOCLOPRAMIDE HCL 5 MG/ML IJ SOLN
10.0000 mg | Freq: Once | INTRAMUSCULAR | Status: AC
  Administered 2014-04-28: 10 mg via INTRAVENOUS
  Filled 2014-04-28: qty 2

## 2014-04-28 MED ORDER — DIPHENHYDRAMINE HCL 50 MG/ML IJ SOLN
25.0000 mg | Freq: Once | INTRAMUSCULAR | Status: AC
  Administered 2014-04-28: 25 mg via INTRAVENOUS
  Filled 2014-04-28: qty 1

## 2014-04-28 MED ORDER — KETOROLAC TROMETHAMINE 30 MG/ML IJ SOLN
30.0000 mg | Freq: Once | INTRAMUSCULAR | Status: AC
Start: 2014-04-28 — End: 2014-04-28
  Administered 2014-04-28: 30 mg via INTRAVENOUS
  Filled 2014-04-28: qty 1

## 2014-04-28 NOTE — ED Provider Notes (Signed)
CSN: [id number]     Arrival date & time 04/28/14  1321 History   First MD Initiated Contact with Patient 04/28/14 1534     Chief Complaint  Patient presents with  . Headache     (Consider location/radiation/quality/duration/timing/severity/associated sxs/prior Treatment) HPI Comments: Patient presents to the emergency department with chief complaint of left-sided headache. She states that the headache started 3 days ago. It was not thunderclap in origin. It is progressively worsening. She reports associated photophobia and phonophobia. She denies any ears, chills, nausea, or vomiting. She has not tried taking anything to alleviate her symptoms. She denies having headache like this before. She denies any ataxia, numbness, or weakness.  The history is provided by the patient. No language interpreter was used.    Past Medical History  Diagnosis Date  . Tuberculosis     inactive  . Blood transfusion without reported diagnosis     no infusion reaction   Past Surgical History  Procedure Laterality Date  . Dilation and curettage of uterus     Family History  Problem Relation Age of Onset  . Diabetes Mother   . Hypertension Mother    History  Substance Use Topics  . Smoking status: Never Smoker   . Smokeless tobacco: Never Used  . Alcohol Use: No   OB History   Grav Para Term Preterm Abortions TAB SAB Ect Mult Living   3 3 3       3      Review of Systems  All other systems reviewed and are negative.     Allergies  Review of patient's allergies indicates no known allergies.  Home Medications   Prior to Admission medications   Medication Sig Start Date End Date Taking? Authorizing Morton Simson  levonorgestrel (MIRENA) 20 MCG/24HR IUD 1 each by Intrauterine route once.   Yes Historical Jaevian Shean, MD   BP 126/73  Pulse 92  Temp(Src) 97.9 F (36.6 C) (Oral)  Resp 18  SpO2 98%  LMP 04/22/2014 Physical Exam  Nursing note and vitals reviewed. Constitutional: She is  oriented to person, place, and time. She appears well-developed and well-nourished.  HENT:  Head: Normocephalic and atraumatic.  Right Ear: External ear normal.  Left Ear: External ear normal.  Eyes: Conjunctivae and EOM are normal. Pupils are equal, round, and reactive to light.  Neck: Normal range of motion. Neck supple.  No pain with neck flexion, no meningismus  Cardiovascular: Normal rate, regular rhythm and normal heart sounds.  Exam reveals no gallop and no friction rub.   No murmur heard. Pulmonary/Chest: Effort normal and breath sounds normal. No respiratory distress. She has no wheezes. She has no rales. She exhibits no tenderness.  Abdominal: Soft. She exhibits no distension and no mass. There is no tenderness. There is no rebound and no guarding.  Musculoskeletal: Normal range of motion. She exhibits no edema and no tenderness.  Normal gait.  Neurological: She is alert and oriented to person, place, and time. She has normal reflexes.  CN 3-12 intact, normal finger to nose, no pronator drift, sensation and strength intact bilaterally.  Skin: Skin is warm and dry.  Psychiatric: She has a normal mood and affect. Her behavior is normal. Judgment and thought content normal.    ED Course  Procedures (including critical care time) Labs Review Labs Reviewed - No data to display  Imaging Review No results found.   EKG Interpretation None      MDM   Final diagnoses:  Headache, unspecified headache type  Patient with headache x3 days. Progressively worsening. No meningeal signs. No fevers. Will treat with migraine cocktail, and will reevaluate.  Pt HA treated and improved while in ED.  Presentation is like pts typical HA and non concerning for [facility], ICH, Meningitis, or temporal arteritis. Pt is afebrile with no focal neuro deficits, nuchal rigidity, or change in vision. Pt is to follow up with PCP to discuss prophylactic medication. Pt verbalizes understanding and is  agreeable with plan to dc.   Patient discussed with Dr. Silverio LayYao, who agrees with the plan.   Roxy Horsemanobert Browning, PA-C 04/28/14 1744

## 2014-04-28 NOTE — ED Notes (Signed)
Pt presents to department for evaluation of L sided headache. Ongoing x3 days. Also states photosensitivity. No nausea/vomiting. Pt is alert and oriented x4.

## 2014-04-28 NOTE — Discharge Instructions (Signed)

## 2014-04-29 ENCOUNTER — Emergency Department (HOSPITAL_COMMUNITY)
Admission: EM | Admit: 2014-04-29 | Discharge: 2014-04-30 | Disposition: A | Discharge status: Home / Self Care | Payer: BC Managed Care – PPO | Attending: Emergency Medicine | Admitting: Emergency Medicine

## 2014-04-29 DIAGNOSIS — J329 Chronic sinusitis, unspecified: Secondary | ICD-10-CM | POA: Insufficient documentation

## 2014-04-29 DIAGNOSIS — G4452 New daily persistent headache (NDPH): Secondary | ICD-10-CM

## 2014-04-29 DIAGNOSIS — IMO0002 Reserved for concepts with insufficient information to code with codable children: Secondary | ICD-10-CM | POA: Insufficient documentation

## 2014-04-29 DIAGNOSIS — R0602 Shortness of breath: Secondary | ICD-10-CM | POA: Insufficient documentation

## 2014-04-29 DIAGNOSIS — Z79899 Other long term (current) drug therapy: Secondary | ICD-10-CM | POA: Insufficient documentation

## 2014-04-29 DIAGNOSIS — Z791 Long term (current) use of non-steroidal anti-inflammatories (NSAID): Secondary | ICD-10-CM | POA: Insufficient documentation

## 2014-04-29 DIAGNOSIS — R0789 Other chest pain: Secondary | ICD-10-CM | POA: Insufficient documentation

## 2014-04-29 DIAGNOSIS — Z3202 Encounter for pregnancy test, result negative: Secondary | ICD-10-CM | POA: Insufficient documentation

## 2014-04-29 DIAGNOSIS — Z8611 Personal history of tuberculosis: Secondary | ICD-10-CM | POA: Insufficient documentation

## 2014-04-29 DIAGNOSIS — R51 Headache: Secondary | ICD-10-CM | POA: Insufficient documentation

## 2014-04-30 ENCOUNTER — Encounter (HOSPITAL_COMMUNITY): Payer: Self-pay | Admitting: Emergency Medicine

## 2014-04-30 LAB — URINALYSIS, ROUTINE W REFLEX MICROSCOPIC
Bilirubin Urine: NEGATIVE
Glucose, UA: NEGATIVE mg/dL
HGB URINE DIPSTICK: NEGATIVE
Ketones, ur: NEGATIVE mg/dL
Nitrite: NEGATIVE
Protein, ur: NEGATIVE mg/dL
SPECIFIC GRAVITY, URINE: 1.022 (ref 1.005–1.030)
UROBILINOGEN UA: 0.2 mg/dL (ref 0.0–1.0)
pH: 7.5 (ref 5.0–8.0)

## 2014-04-30 LAB — I-STAT TROPONIN, ED: Troponin i, poc: 0 ng/mL (ref 0.00–0.08)

## 2014-04-30 LAB — I-STAT CHEM 8, ED
BUN: 17 mg/dL (ref 6–23)
Calcium, Ion: 1.28 mmol/L — ABNORMAL HIGH (ref 1.12–1.23)
Chloride: 104 mEq/L (ref 96–112)
Creatinine, Ser: 0.7 mg/dL (ref 0.50–1.10)
GLUCOSE: 87 mg/dL (ref 70–99)
HCT: 37 % (ref 36.0–46.0)
Hemoglobin: 12.6 g/dL (ref 12.0–15.0)
Potassium: 3.6 mEq/L — ABNORMAL LOW (ref 3.7–5.3)
Sodium: 142 mEq/L (ref 137–147)
TCO2: 24 mmol/L (ref 0–100)

## 2014-04-30 LAB — PREGNANCY, URINE: PREG TEST UR: NEGATIVE

## 2014-04-30 LAB — URINE MICROSCOPIC-ADD ON

## 2014-04-30 MED ORDER — FLUTICASONE PROPIONATE 50 MCG/ACT NA SUSP: 2.0000 | Freq: Two times a day (BID) | NASAL | Status: DC

## 2014-04-30 MED ORDER — NAPROXEN 500 MG PO TABS: 500.0000 mg | ORAL_TABLET | Freq: Two times a day (BID) | ORAL | Status: DC

## 2014-04-30 MED ORDER — ACETAMINOPHEN 325 MG PO TABS
650.0000 mg | ORAL_TABLET | Freq: Once | ORAL | Status: AC
  Administered 2014-04-30: 650 mg via ORAL
  Filled 2014-04-30: qty 2

## 2014-04-30 MED ORDER — POTASSIUM CHLORIDE CRYS ER 20 MEQ PO TBCR
20.0000 meq | EXTENDED_RELEASE_TABLET | Freq: Once | ORAL | Status: AC
  Administered 2014-04-30: 20 meq via ORAL
  Filled 2014-04-30: qty 1

## 2014-04-30 NOTE — ED Notes (Signed)
Pt states yesterday she felt like her heart was beating slow and she was having shortness of breath with left side chest tightness  [facility]  Pt states she also was having headache  Pt states they told her to follow up on Monday  Pt states tonight about 2145 she started feeling the same way

## 2014-04-30 NOTE — Discharge Instructions (Signed)
Your workup today has not shown a specific cause for your chest tightness and palpitations.  Your EKG did not show any problems with your heart's rhythm.  Your potassium was slightly low, please use the handout below to help supplement with diet.  Your headache may be a result from your chronic nasal congestion.  Please use medications as prescribed.  Follow up with the specialists listed on yesterday's discharge handout.   Chest Pain (Nonspecific) It is often hard to give a specific diagnosis for the cause of chest pain. There is always a chance that your pain could be related to something serious, such as a heart attack or a blood clot in the lungs. You need to follow up with your health care provider for further evaluation. CAUSES   Heartburn.  Pneumonia or bronchitis.  Anxiety or stress.  Inflammation around your heart (pericarditis) or lung (pleuritis or pleurisy).  A blood clot in the lung.  A collapsed lung (pneumothorax). It can develop suddenly on its own (spontaneous pneumothorax) or from trauma to the chest.  Shingles infection (herpes zoster virus). The chest wall is composed of bones, muscles, and cartilage. Any of these can be the source of the pain.  The bones can be bruised by injury.  The muscles or cartilage can be strained by coughing or overwork.  The cartilage can be affected by inflammation and become sore (costochondritis). DIAGNOSIS  Lab tests or other studies may be needed to find the cause of your pain. Your health care provider may have you take a test called an ambulatory electrocardiogram (ECG). An ECG records your heartbeat patterns over a 24-hour period. You may also have other tests, such as:  Transthoracic echocardiogram (TTE). During echocardiography, sound waves are used to evaluate how blood flows through your heart.  Transesophageal echocardiogram (TEE).  Cardiac monitoring. This allows your health care provider to monitor your heart rate and  rhythm in real time.  Holter monitor. This is a portable device that records your heartbeat and can help diagnose heart arrhythmias. It allows your health care provider to track your heart activity for several days, if needed.  Stress tests by exercise or by giving medicine that makes the heart beat faster. TREATMENT   Treatment depends on what may be causing your chest pain. Treatment may include:  Acid blockers for heartburn.  Anti-inflammatory medicine.  Pain medicine for inflammatory conditions.  Antibiotics if an infection is present.  You may be advised to change lifestyle habits. This includes stopping smoking and avoiding alcohol, caffeine, and chocolate.  You may be advised to keep your head raised (elevated) when sleeping. This reduces the chance of acid going backward from your stomach into your esophagus. Most of the time, nonspecific chest pain will improve within 2-3 days with rest and mild pain medicine.  HOME CARE INSTRUCTIONS   If antibiotics were prescribed, take them as directed. Finish them even if you start to feel better.  For the next few days, avoid physical activities that bring on chest pain. Continue physical activities as directed.  Do not use any tobacco products, including cigarettes, chewing tobacco, or electronic cigarettes.  Avoid drinking alcohol.  Only take medicine as directed by your health care provider.  Follow your health care provider's suggestions for further testing if your chest pain does not go away.  Keep any follow-up appointments you made. If you do not go to an appointment, you could develop lasting (chronic) problems with pain. If there is any problem keeping an  appointment, call to reschedule. SEEK MEDICAL CARE IF:   Your chest pain does not go away, even after treatment.  You have a rash with blisters on your chest.  You have a fever. SEEK IMMEDIATE MEDICAL CARE IF:   You have increased chest pain or pain that spreads to  your arm, neck, jaw, back, or abdomen.  You have shortness of breath.  You have an increasing cough, or you cough up blood.  You have severe back or abdominal pain.  You feel nauseous or vomit.  You have severe weakness.  You faint.  You have chills. This is an emergency. Do not wait to see if the pain will go away. Get medical help at once. Call your local emergency services (911 in [place] Do not drive yourself to the hospital. MAKE SURE YOU:   Understand these instructions.  Will watch your condition.  Will get help right away if you are not doing well or get worse. Document Released: 07/29/2005 Document Revised: 10/24/2013 Document Reviewed: 05/24/2008 [facility] Patient Information 2015 [place], [place]. This information is not intended to replace advice given to you by your health care provider. Make sure you discuss any questions you have with your health care provider.  Headaches, Frequently Asked Questions MIGRAINE HEADACHES Q: What is migraine? What causes it? How can I treat it? A: Generally, migraine headaches begin as a dull ache. Then they develop into a constant, throbbing, and pulsating pain. You may experience pain at the temples. You may experience pain at the front or back of one or both sides of the head. The pain is usually accompanied by a combination of:  Nausea.  Vomiting.  Sensitivity to light and noise. Some people (about 15%) experience an aura (see below) before an attack. The cause of migraine is believed to be chemical reactions in the brain. Treatment for migraine may include over-the-counter or prescription medications. It may also include self-help techniques. These include relaxation training and biofeedback.  Q: What is an aura? A: About 15% of people with migraine get an "aura". This is a sign of neurological symptoms that occur before a migraine headache. You may see wavy or jagged lines, dots, or flashing lights. You might experience tunnel  vision or blind spots in one or both eyes. The aura can include visual or auditory hallucinations (something imagined). It may include disruptions in smell (such as strange odors), taste or touch. Other symptoms include:  Numbness.  A "pins and needles" sensation.  Difficulty in recalling or speaking the correct word. These neurological events may last as long as 60 minutes. These symptoms will fade as the headache begins. Q: What is a trigger? A: Certain physical or environmental factors can lead to or "trigger" a migraine. These include:  Foods.  Hormonal changes.  Weather.  Stress. It is important to remember that triggers are different for everyone. To help prevent migraine attacks, you need to figure out which triggers affect you. Keep a headache diary. This is a good way to track triggers. The diary will help you talk to your healthcare professional about your condition. Q: Does weather affect migraines? A: Bright sunshine, hot, humid conditions, and drastic changes in barometric pressure may lead to, or "trigger," a migraine attack in some people. But studies have shown that weather does not act as a trigger for everyone with migraines. Q: What is the link between migraine and hormones? A: Hormones start and regulate many of your body's functions. Hormones keep your body in balance  within a constantly changing environment. The levels of hormones in your body are unbalanced at times. Examples are during menstruation, pregnancy, or menopause. That can lead to a migraine attack. In fact, about three quarters of all women with migraine report that their attacks are related to the menstrual cycle.  Q: Is there an increased risk of stroke for migraine sufferers? A: The likelihood of a migraine attack causing a stroke is very remote. That is not to say that migraine sufferers cannot have a stroke associated with their migraines. In persons under age 6, the most common associated factor for  stroke is migraine headache. But over the course of a person's normal life span, the occurrence of migraine headache may actually be associated with a reduced risk of dying from cerebrovascular disease due to stroke.  Q: What are acute medications for migraine? A: Acute medications are used to treat the pain of the headache after it has started. Examples over-the-counter medications, NSAIDs, ergots, and triptans.  Q: What are the triptans? A: Triptans are the newest class of abortive medications. They are specifically targeted to treat migraine. Triptans are vasoconstrictors. They moderate some chemical reactions in the brain. The triptans work on receptors in your brain. Triptans help to restore the balance of a neurotransmitter called serotonin. Fluctuations in levels of serotonin are thought to be a main cause of migraine.  Q: Are over-the-counter medications for migraine effective? A: Over-the-counter, or "OTC," medications may be effective in relieving mild to moderate pain and associated symptoms of migraine. But you should see your caregiver before beginning any treatment regimen for migraine.  Q: What are preventive medications for migraine? A: Preventive medications for migraine are sometimes referred to as "prophylactic" treatments. They are used to reduce the frequency, severity, and length of migraine attacks. Examples of preventive medications include antiepileptic medications, antidepressants, beta-blockers, calcium channel blockers, and NSAIDs (nonsteroidal anti-inflammatory drugs). Q: Why are anticonvulsants used to treat migraine? A: During the past few years, there has been an increased interest in antiepileptic drugs for the prevention of migraine. They are sometimes referred to as "anticonvulsants". Both epilepsy and migraine may be caused by similar reactions in the brain.  Q: Why are antidepressants used to treat migraine? A: Antidepressants are typically used to treat people with  depression. They may reduce migraine frequency by regulating chemical levels, such as serotonin, in the brain.  Q: What alternative therapies are used to treat migraine? A: The term "alternative therapies" is often used to describe treatments considered outside the scope of conventional Western medicine. Examples of alternative therapy include acupuncture, acupressure, and yoga. Another common alternative treatment is herbal therapy. Some herbs are believed to relieve headache pain. Always discuss alternative therapies with your caregiver before proceeding. Some herbal products contain arsenic and other toxins. TENSION HEADACHES Q: What is a tension-type headache? What causes it? How can I treat it? A: Tension-type headaches occur randomly. They are often the result of temporary stress, anxiety, fatigue, or anger. Symptoms include soreness in your temples, a tightening band-like sensation around your head (a "vice-like" ache). Symptoms can also include a pulling feeling, pressure sensations, and contracting head and neck muscles. The headache begins in your forehead, temples, or the back of your head and neck. Treatment for tension-type headache may include over-the-counter or prescription medications. Treatment may also include self-help techniques such as relaxation training and biofeedback. CLUSTER HEADACHES Q: What is a cluster headache? What causes it? How can I treat it? A: Cluster headache gets its  name because the attacks come in groups. The pain arrives with little, if any, warning. It is usually on one side of the head. A tearing or bloodshot eye and a runny nose on the same side of the headache may also accompany the pain. Cluster headaches are believed to be caused by chemical reactions in the brain. They have been described as the most severe and intense of any headache type. Treatment for cluster headache includes prescription medication and oxygen. SINUS HEADACHES Q: What is a sinus  headache? What causes it? How can I treat it? A: When a cavity in the bones of the face and skull (a sinus) becomes inflamed, the inflammation will cause localized pain. This condition is usually the result of an allergic reaction, a tumor, or an infection. If your headache is caused by a sinus blockage, such as an infection, you will probably have a fever. An x-ray will confirm a sinus blockage. Your caregiver's treatment might include antibiotics for the infection, as well as antihistamines or decongestants.  REBOUND HEADACHES Q: What is a rebound headache? What causes it? How can I treat it? A: A pattern of taking acute headache medications too often can lead to a condition known as "rebound headache." A pattern of taking too much headache medication includes taking it more than 2 days per week or in excessive amounts. That means more than the label or a caregiver advises. With rebound headaches, your medications not only stop relieving pain, they actually begin to cause headaches. Doctors treat rebound headache by tapering the medication that is being overused. Sometimes your caregiver will gradually substitute a different type of treatment or medication. Stopping may be a challenge. Regularly overusing a medication increases the potential for serious side effects. Consult a caregiver if you regularly use headache medications more than 2 days per week or more than the label advises. ADDITIONAL QUESTIONS AND ANSWERS Q: What is biofeedback? A: Biofeedback is a self-help treatment. Biofeedback uses special equipment to monitor your body's involuntary physical responses. Biofeedback monitors:  Breathing.  Pulse.  Heart rate.  Temperature.  Muscle tension.  Brain activity. Biofeedback helps you refine and perfect your relaxation exercises. You learn to control the physical responses that are related to stress. Once the technique has been mastered, you do not need the equipment any more. Q: Are  headaches hereditary? A: Four out of five (80%) of people that suffer report a family history of migraine. Scientists are not sure if this is genetic or a family predisposition. Despite the uncertainty, a child has a 50% chance of having migraine if one parent suffers. The child has a 75% chance if both parents suffer.  Q: Can children get headaches? A: By the time they reach high school, most young people have experienced some type of headache. Many safe and effective approaches or medications can prevent a headache from occurring or stop it after it has begun.  Q: What type of doctor should I see to diagnose and treat my headache? A: Start with your primary caregiver. Discuss his or her experience and approach to headaches. Discuss methods of classification, diagnosis, and treatment. Your caregiver may decide to recommend you to a headache specialist, depending upon your symptoms or other physical conditions. Having diabetes, allergies, etc., may require a more comprehensive and inclusive approach to your headache. The National Headache Foundation will provide, upon request, a list of [facility] physician members in your state. Document Released: 01/09/2004 Document Revised: 01/11/2012 Document Reviewed: 06/18/2008 Exit[employer] Patient Information  2015 [employer]. This information is not intended to replace advice given to you by your health care provider. Make sure you discuss any questions you have with your health care provider.  Pain of Unknown Etiology (Pain Without a Known Cause) You have come to your caregiver because of pain. Pain can occur in any part of the body. Often there is not a definite cause. If your laboratory (blood or urine) work was normal and X-rays or other studies were normal, your caregiver may treat you without knowing the cause of the pain. An example of this is the headache. Most headaches are diagnosed by taking a history. This means your caregiver asks you questions about your  headaches. Your caregiver determines a treatment based on your answers. Usually testing done for headaches is normal. Often testing is not done unless there is no response to medications. Regardless of where your pain is located today, you can be given medications to make you comfortable. If no physical cause of pain can be found, most cases of pain will gradually leave as suddenly as they came.  If you have a painful condition and no reason can be found for the pain, it is important that you follow up with your caregiver. If the pain becomes worse or does not go away, it may be necessary to repeat tests and look further for a possible cause.  Only take over-the-counter or prescription medicines for pain, discomfort, or fever as directed by your caregiver.  For the protection of your privacy, test results cannot be given over the phone. Make sure you receive the results of your test. Ask how these results are to be obtained if you have not been informed. It is your responsibility to obtain your test results.  You may continue all activities unless the activities cause more pain. When the pain lessens, it is important to gradually resume normal activities. Resume activities by beginning slowly and gradually increasing the intensity and duration of the activities or exercise. During periods of severe pain, bed rest may be helpful. Lie or sit in any position that is comfortable.  Ice used for acute (sudden) conditions may be effective. Use a large plastic bag filled with ice and wrapped in a towel. This may provide pain relief.  See your caregiver for continued problems. Your caregiver can help or refer you for exercises or physical therapy if necessary. If you were given medications for your condition, do not drive, operate machinery or power tools, or sign legal documents for 24 hours. Do not drink alcohol, take sleeping pills, or take other medications that may interfere with treatment. See your  caregiver immediately if you have pain that is becoming worse and not relieved by medications. Document Released: 07/14/2001 Document Revised: 08/09/2013 Document Reviewed: 10/19/2005 [facility] Patient Information 2015 [place], [place]. This information is not intended to replace advice given to you by your health care provider. Make sure you discuss any questions you have with your health care provider.

## 2014-04-30 NOTE — ED Provider Notes (Signed)
CSN: [id number]     Arrival date & time 04/29/14  2325 History   First MD Initiated Contact with Patient 04/30/14 0145     Chief Complaint  Patient presents with  . Headache  . Shortness of Breath     (Consider location/radiation/quality/duration/timing/severity/associated sxs/prior Treatment) HPI 33 year old female presents to emergency department with complaint of persistent headache, left upper chest tightness, and palpitations.  Patient was seen in the emergency department yesterday and treated with medications and fluid for headache.  At that time she did not have chest pain or shortness of breath.  She reports that intermittently she will have palpitations.  Patient reports she's had 10 years of nasal congestion.  She was told by an ENT specialist that she would need surgery, at that time she was breast-feeding and has since been lost to followup.  Patient is also complaining of a knot behind her left ear which is at present for over a near.  No fevers or chills.  No dizziness or weakness.  No prior history of coronary disease, hypertension hyperlipidemia.  No family history of same.  She is nonsmoker.  Patient currently undergoing fasting for Ramadan.  Patient has not taken any Tylenol or for her headache.  The headache is global, not the worst headache of her life. Past Medical History  Diagnosis Date  . Tuberculosis     inactive  . Blood transfusion without reported diagnosis     no infusion reaction   Past Surgical History  Procedure Laterality Date  . Dilation and curettage of uterus     Family History  Problem Relation Age of Onset  . Diabetes Mother   . Hypertension Mother    History  Substance Use Topics  . Smoking status: Never Smoker   . Smokeless tobacco: Never Used  . Alcohol Use: No   OB History   Grav Para Term Preterm Abortions TAB SAB Ect Mult Living   3 3 3       3      Review of Systems  See History of Present Illness; otherwise all other systems are  reviewed and negative   Allergies  Review of patient's allergies indicates no known allergies.  Home Medications   Prior to Admission medications   Medication Sig Start Date End Date Taking? Authorizing Provider  fluticasone (FLONASE) 50 MCG/ACT nasal spray Place 2 sprays into both nostrils 2 (two) times daily. 04/30/14   Olivia Mackielga M Otter, MD  levonorgestrel (MIRENA) 20 MCG/24HR IUD 1 each by Intrauterine route once.    Historical Provider, MD  naproxen (NAPROSYN) 500 MG tablet Take 1 tablet (500 mg total) by mouth 2 (two) times daily. PRN pain 04/30/14   Olivia Mackielga M Otter, MD   BP 120/81  Pulse 66  Temp(Src) 98.5 F (36.9 C) (Oral)  Resp 15  SpO2 100%  LMP 04/22/2014 Physical Exam  Nursing note and vitals reviewed. Constitutional: She is oriented to person, place, and time. She appears well-developed and well-nourished. No distress.  HENT:  Head: Normocephalic and atraumatic.  Right Ear: External ear normal.  Left Ear: External ear normal.  Nose: Nose normal.  Mouth/Throat: Oropharynx is clear and moist.  Eyes: Conjunctivae and EOM are normal. Pupils are equal, round, and reactive to light.  Neck: Normal range of motion. Neck supple. No JVD present. No tracheal deviation present. No thyromegaly present.  Cardiovascular: Normal rate, regular rhythm, normal heart sounds and intact distal pulses.  Exam reveals no gallop and no friction rub.   No  murmur heard. Pulmonary/Chest: Effort normal and breath sounds normal. No stridor. No respiratory distress. She has no wheezes. She has no rales. She exhibits no tenderness.  Abdominal: Soft. Bowel sounds are normal. She exhibits no distension and no mass. There is no tenderness. There is no rebound and no guarding.  Musculoskeletal: Normal range of motion. She exhibits no edema and no tenderness.  Lymphadenopathy:    She has no cervical adenopathy.  Neurological: She is alert and oriented to person, place, and time. She has normal reflexes. No  cranial nerve deficit. She exhibits normal muscle tone. Coordination normal.  Skin: Skin is warm and dry. No rash noted. No erythema. No pallor.  Psychiatric: She has a normal mood and affect. Her behavior is normal. Judgment and thought content normal.    ED Course  Procedures (including critical care time) Labs Review Labs Reviewed  URINALYSIS, ROUTINE W REFLEX MICROSCOPIC - Abnormal; Notable for the following:    APPearance CLOUDY (*)    Leukocytes, UA SMALL (*)    All other components within normal limits  URINE MICROSCOPIC-ADD ON - Abnormal; Notable for the following:    Squamous Epithelial / LPF MANY (*)    All other components within normal limits  I-STAT CHEM 8, ED - Abnormal; Notable for the following:    Potassium 3.6 (*)    Calcium, Ion 1.28 (*)    All other components within normal limits  PREGNANCY, URINE  I-STAT TROPOININ, ED  I-STAT TROPOININ, ED    Imaging Review No results found.   EKG Interpretation   Date/Time:  Monday April 30 2014 02:57:32 EDT Ventricular Rate:  68 PR Interval:  144 QRS Duration: 88 QT Interval:  389 QTC Calculation: 414 R Axis:   82 Text Interpretation:  Sinus rhythm Consider left ventricular hypertrophy  Confirmed by OTTER  MD, OLGA ([id number]) on 04/30/2014 6:48:51 AM      MDM   Final diagnoses:  Atypical chest pain  New daily persistent headache  Chronic sinusitis, unspecified location    33 year old female with a brief period of chest tightness, and sense of palpitations.  Workup here has been unremarkable.  Low risk for PE and ACS.  Patient feeling better at discharge, no questions and has followup arranged.    Olivia Mackielga M Otter, MD 04/30/14 [phone]

## 2014-05-01 NOTE — ED Provider Notes (Signed)
Medical screening examination/treatment/procedure(s) were performed by non-physician practitioner and as supervising physician I was immediately available for consultation/collaboration.   EKG Interpretation None        Saylah Ketner H Stirling Orton, MD 05/01/14 0711 

## 2014-06-11 ENCOUNTER — Ambulatory Visit: Payer: BC Managed Care – PPO | Admitting: Obstetrics & Gynecology

## 2014-06-18 ENCOUNTER — Ambulatory Visit: Payer: BC Managed Care – PPO | Admitting: Obstetrics & Gynecology

## 2014-06-27 ENCOUNTER — Ambulatory Visit: Payer: BC Managed Care – PPO | Admitting: Obstetrics & Gynecology

## 2014-06-27 ENCOUNTER — Ambulatory Visit (INDEPENDENT_AMBULATORY_CARE_PROVIDER_SITE_OTHER): Payer: BC Managed Care – PPO | Admitting: Obstetrics & Gynecology

## 2014-06-27 VITALS — BP 117/78 | HR 69 | Temp 98.6°F | Wt 168.0 lb

## 2014-06-27 DIAGNOSIS — Z3202 Encounter for pregnancy test, result negative: Secondary | ICD-10-CM

## 2014-06-27 DIAGNOSIS — Z30432 Encounter for removal of intrauterine contraceptive device: Secondary | ICD-10-CM

## 2014-06-27 DIAGNOSIS — Z01818 Encounter for other preprocedural examination: Secondary | ICD-10-CM

## 2014-06-27 LAB — POCT URINE PREGNANCY: Preg Test, Ur: NEGATIVE

## 2014-06-28 ENCOUNTER — Ambulatory Visit: Payer: BC Managed Care – PPO | Admitting: Obstetrics & Gynecology

## 2014-07-01 ENCOUNTER — Encounter: Payer: Self-pay | Admitting: Obstetrics & Gynecology

## 2014-07-01 NOTE — Patient Instructions (Signed)
Preparing for Pregnancy Before trying to become pregnant, make an appointment with your health care provider (preconception care). The goal is to help you have a healthy, safe pregnancy. At your first appointment, your health care provider will:   Do a complete physical exam, including a Pap test.  Take a complete medical history.  Give you advice and help you resolve any problems. PRECONCEPTION CHECKLIST Here is a list of the basics to cover with your health care provider at your preconception visit:  Medical history.  Tell your health care provider about any diseases you have had. Many diseases can affect your pregnancy.  Include your partner's medical history and family history.  Make sure you have been tested for sexually transmitted infections (STIs). These can affect your pregnancy. In some cases, they can be passed to your baby. Tell your health care provider about any history of STIs.  Make sure your health care provider knows about any previous problems you have had with conception or pregnancy.  Tell your health care provider about any medicine you take. This includes herbal supplements and over-the-counter medicines.  Make sure all your immunizations are up to date. You may need to make additional appointments.  Ask your health care provider if you need any vaccinations or if there are any you should avoid.  Diet.  It is especially important to eat a healthy, balanced diet with the right nutrients when you are pregnant.  Ask your health care provider to help you get to a healthy weight before pregnancy.  If you are overweight, you are at higher risk for certain complications. These include high blood pressure, diabetes, and preterm birth.  If you are underweight, you are more likely to have a low-birth-weight baby.  Lifestyle.  Tell your health care provider about lifestyle factors such as alcohol use, drug use, or smoking.  Describe any harmful substances you may  be exposed to at work or home. These can include chemicals, pesticides, and radiation.  Mental health.  Let your health care provider know if you have been feeling depressed or anxious.  Let your health care provider know if you have a history of substance abuse.  Let your health care provider know if you do not feel safe at home. HOME INSTRUCTIONS TO PREPARE FOR PREGNANCY Follow your health care provider's advice and instructions.   Keep an accurate record of your menstrual periods. This makes it easier for your health care provider to determine your baby's due date.  Begin taking prenatal vitamins and folic acid supplements daily. Take them as directed by your health care provider.  Eat a balanced diet. Get help from a nutrition counselor if you have questions or need help.  Get regular exercise. Try to be active for at least 30 minutes a day most days of the week.  Quit smoking, if you smoke.  Do not drink alcohol.  Do not take illegal drugs.  Get medical problems, such as diabetes or high blood pressure, under control.  If you have diabetes, make sure you do the following:  Have good blood sugar control. If you have type 1 diabetes, use multiple daily doses of insulin. Do not use split-dose or premixed insulin.  Have an eye exam by a qualified eye care professional trained in caring for people with diabetes.  Get evaluated by your health care provider for cardiovascular disease.  Get to a healthy weight. If you are overweight or obese, reduce your weight with the help of a qualified health   professional such as a registered dietitian. Ask your health care provider what the right weight range is for you. HOW DO I KNOW I AM PREGNANT? You may be pregnant if you have been sexually active and you miss your period. Symptoms of early pregnancy include:   Mild cramping.  Very light vaginal bleeding (spotting).  Feeling unusually tired.  Morning sickness. If you have any of  these symptoms, take a home pregnancy test. These tests look for a hormone called human chorionic gonadotropin (hCG) in your urine. Your body begins to make this hormone during early pregnancy. These tests are very accurate. Wait until at least the first day you miss your period to take one. If you get a positive result, call your health care provider to make appointments for prenatal care. WHAT SHOULD I DO IF I BECOME PREGNANT?  Make an appointment with your health care provider by week 12 of your pregnancy at the latest.  Do not smoke. Smoking can be harmful to your baby.  Do not drink alcoholic beverages. Alcohol is related to a number of birth defects.  Avoid toxic odors and chemicals.  You may continue to have sexual intercourse if it does not cause pain or other problems, such as vaginal bleeding. Document Released: 10/01/2008 Document Revised: 03/05/2014 Document Reviewed: 09/25/2013 Exit[employer] Patient Information 2015 [employer]. This information is not intended to replace advice given to you by your health care provider. Make sure you discuss any questions you have with your health care provider.  

## 2014-07-01 NOTE — Progress Notes (Signed)
Patient ID: Chelsea Castaneda, female   DOB: 1981/07/17, 33 y.o.   MRN: [id number]  Chief Complaint  Patient presents with  . Contraception    iud removal    HPI Chelsea Castaneda is a 33 y.o. female.  She wants to attempt to conceive.  HPI  Past Medical History  Diagnosis Date  . Tuberculosis     inactive  . Blood transfusion without reported diagnosis     no infusion reaction    Past Surgical History  Procedure Laterality Date  . Dilation and curettage of uterus      Family History  Problem Relation Age of Onset  . Diabetes Mother   . Hypertension Mother     Social History History  Substance Use Topics  . Smoking status: Never Smoker   . Smokeless tobacco: Never Used  . Alcohol Use: No    No Known Allergies  Current Outpatient Prescriptions  Medication Sig Dispense Refill  . levonorgestrel (MIRENA) 20 MCG/24HR IUD 1 each by Intrauterine route once.      . fluticasone (FLONASE) 50 MCG/ACT nasal spray Place 2 sprays into both nostrils 2 (two) times daily.  15.8 g  2  . naproxen (NAPROSYN) 500 MG tablet Take 1 tablet (500 mg total) by mouth 2 (two) times daily. PRN pain  30 tablet  0  . [DISCONTINUED] gabapentin (NEURONTIN) 100 MG capsule Take 1 capsule (100 mg total) by mouth 3 (three) times daily.  30 capsule  0   No current facility-administered medications for this visit.    Review of Systems Review of Systems Constitutional: negative for fatigue and weight loss Respiratory: negative for cough and wheezing Cardiovascular: negative for chest pain, fatigue and palpitations Gastrointestinal: negative for abdominal pain and change in bowel habits Genitourinary:negative for abnormal vaginal discharge Integument/breast: negative for nipple discharge Musculoskeletal:negative for myalgias Neurological: negative for gait problems and tremors Behavioral/Psych: negative for abusive relationship, depression Endocrine: negative for temperature intolerance      Blood pressure 117/78, pulse 69, temperature 98.6 F (37 C), weight 76.204 kg (168 lb), currently breastfeeding.  Physical Exam Physical Exam General:   alert  Skin:   no rash or abnormalities  Abdomen:  normal findings: no organomegaly, soft, non-tender and no hernia  Pelvis:  External genitalia: normal general appearance Urinary system: urethral meatus normal and bladder without fullness, nontender Vaginal: normal without tenderness, induration or masses Cervix: normal appearance; the IUD strings were present; the IUD strings were grasped and the IUD was removed intact Adnexa: normal bimanual exam Uterus: anteverted and non-tender, normal size      Data Reviewed None  Assessment    S/P IUD removal     Plan   Preconception counseling Orders Placed This Encounter  Procedures  . POCT urine pregnancy    Possible management options include: Folic acid supplement Follow up as needed.         JACKSON-MOORE,Shamon Lobo A 07/01/2014, 9:27 PM

## 2014-07-10 ENCOUNTER — Ambulatory Visit (INDEPENDENT_AMBULATORY_CARE_PROVIDER_SITE_OTHER): Payer: BC Managed Care – PPO

## 2014-07-10 ENCOUNTER — Ambulatory Visit (INDEPENDENT_AMBULATORY_CARE_PROVIDER_SITE_OTHER): Payer: BC Managed Care – PPO | Admitting: Family Medicine

## 2014-07-10 VITALS — BP 100/80 | HR 74 | Temp 97.8°F | Resp 16 | Ht 64.0 in | Wt 171.0 lb

## 2014-07-10 DIAGNOSIS — M79644 Pain in right finger(s): Secondary | ICD-10-CM

## 2014-07-10 DIAGNOSIS — M79609 Pain in unspecified limb: Secondary | ICD-10-CM

## 2014-07-10 DIAGNOSIS — Z719 Counseling, unspecified: Secondary | ICD-10-CM

## 2014-07-10 DIAGNOSIS — M7989 Other specified soft tissue disorders: Secondary | ICD-10-CM

## 2014-07-10 MED ORDER — PRENATAL MULTIVITAMIN CH: 1.0000 | ORAL_TABLET | Freq: Every day | ORAL | Status: DC

## 2014-07-10 NOTE — Progress Notes (Signed)
Subjective:    Patient ID: Chelsea Castaneda, female    DOB: 1981-01-15, 33 y.o.   MRN: [id number]  HPI Chelsea Castaneda is a 33 y.o. female  C/o R 3rd finger swelling. About 10 days of symptoms, NKI, just started swelling. Sore to press on it, but not sore if not pressing on it. Able to bend, but tight.  No other joint swelling, no prior similar sx's.   R hand dominant. Not employed, no new activities.   Tx: onin skin over knuckle, no ice or other treatment.    Additionally  would like pnv rx as plans on trying to become pregnant.    Patient Active Problem List   Diagnosis Date Noted  . Unspecified vitamin D deficiency 10/27/2013   Past Medical History  Diagnosis Date  . Tuberculosis     inactive  . Blood transfusion without reported diagnosis     no infusion reaction   Past Surgical History  Procedure Laterality Date  . Dilation and curettage of uterus     No Known Allergies Prior to Admission medications   Medication Sig Start Date End Date Taking? Authorizing Provider  naproxen (NAPROSYN) 500 MG tablet Take 1 tablet (500 mg total) by mouth 2 (two) times daily. PRN pain 04/30/14   Olivia Mackie, MD   History   Social History  . Marital Status: Married    Spouse Name: N/A    Number of Children: N/A  . Years of Education: N/A   Occupational History  . Not on file.   Social History Main Topics  . Smoking status: Never Smoker   . Smokeless tobacco: Never Used  . Alcohol Use: No  . Drug Use: No  . Sexual Activity: Not Currently   Other Topics Concern  . Not on file   Social History Narrative  . No narrative on file     Review of Systems  Constitutional: Negative for fever and chills.  Musculoskeletal: Positive for joint swelling.  Skin: Negative for color change, rash and wound.       Objective:   Physical Exam  Vitals reviewed. Constitutional: She is oriented to person, place, and time. She appears well-developed and well-nourished. No  distress.  Pulmonary/Chest: Effort normal.  Musculoskeletal:       Right hand: She exhibits bony tenderness and swelling. Normal sensation noted. Normal strength noted.       Hands: Neurological: She is alert and oriented to person, place, and time.  nvi distally and cap refill less than 2s.   Skin: Skin is warm and dry. No rash noted. No erythema. No pallor.  Psychiatric: She has a normal mood and affect. Her behavior is normal.   Filed Vitals:   07/10/14 0825  BP: 100/80  Pulse: 74  Temp: 97.8 F (36.6 C)  TempSrc: Oral  Resp: 16  Height:  (1.626 m)  Weight: 171 lb (77.565 kg)  SpO2: 98%   [facility] reading (PRIMARY) by  Dr. Neva Seat: R 3rd phalynx:  No fracture.       Assessment & Plan:   Chelsea Castaneda is a 33 y.o. female Finger swelling - Plan: DG Finger Middle Right, Pain of finger of right hand - Plan: DG Finger Middle Right  -jammed finger vs isolated arthropathy. Splint as needed for next week to 10d, rom each day, otc advil or alleve and recheck if not improving, sooner if worse or other areas of involvement. rtc precautions.   Counseling NOS - Plan:  Prenatal Vit-Fe Fumarate-FA (PRENATAL MULTIVITAMIN) TABS tablet    Meds ordered this encounter  Medications  . Prenatal Vit-Fe Fumarate-FA (PRENATAL MULTIVITAMIN) TABS tablet    Sig: Take 1 tablet by mouth daily at 12 noon.    Dispense:  30 tablet    Refill:  6    PNV of choice.   Patient Instructions  advil or alleve if needed, brace over finger as needed next 1-2 weeks, but come out of brace few times per day for range of motion. If not improving in next 10 days - recheck.  Return to the clinic or go to the nearest emergency room if any of your symptoms worsen or new symptoms occur.  Prenatal vitamin once per day.

## 2014-07-10 NOTE — Patient Instructions (Signed)
advil or alleve if needed, brace over finger as needed next 1-2 weeks, but come out of brace few times per day for range of motion. If not improving in next 10 days - recheck.  Return to the clinic or go to the nearest emergency room if any of your symptoms worsen or new symptoms occur.  Prenatal vitamin once per day.

## 2014-07-25 ENCOUNTER — Ambulatory Visit (INDEPENDENT_AMBULATORY_CARE_PROVIDER_SITE_OTHER): Payer: BC Managed Care – PPO | Admitting: Emergency Medicine

## 2014-07-25 VITALS — BP 110/68 | HR 95 | Temp 98.7°F | Resp 16 | Ht 64.0 in | Wt 169.0 lb

## 2014-07-25 DIAGNOSIS — N926 Irregular menstruation, unspecified: Secondary | ICD-10-CM

## 2014-07-25 DIAGNOSIS — N925 Other specified irregular menstruation: Secondary | ICD-10-CM

## 2014-07-25 DIAGNOSIS — Z3481 Encounter for supervision of other normal pregnancy, first trimester: Secondary | ICD-10-CM

## 2014-07-25 DIAGNOSIS — N949 Unspecified condition associated with female genital organs and menstrual cycle: Secondary | ICD-10-CM

## 2014-07-25 LAB — POCT URINE PREGNANCY: Preg Test, Ur: POSITIVE

## 2014-07-25 NOTE — Patient Instructions (Signed)
First Trimester of Pregnancy The first trimester of pregnancy is from week 1 until the end of week 12 (months 1 through 3). During this time, your baby will begin to develop inside you. At 6-8 weeks, the eyes and face are formed, and the heartbeat can be seen on ultrasound. At the end of 12 weeks, all the baby's organs are formed. Prenatal care is all the medical care you receive before the birth of your baby. Make sure you get good prenatal care and follow all of your doctor's instructions. HOME CARE  Medicines  Take medicine only as told by your doctor. Some medicines are safe and some are not during pregnancy.  Take your prenatal vitamins as told by your doctor.  Take medicine that helps you poop (stool softener) as needed if your doctor says it is okay. Diet  Eat regular, healthy meals.  Your doctor will tell you the amount of weight gain that is right for you.  Avoid raw meat and uncooked cheese.  If you feel sick to your stomach (nauseous) or throw up (vomit):  Eat 4 or 5 small meals a day instead of 3 large meals.  Try eating a few soda crackers.  Drink liquids between meals instead of during meals.  If you have a hard time pooping (constipation):  Eat high-fiber foods like fresh vegetables, fruit, and whole grains.  Drink enough fluids to keep your pee (urine) clear or pale yellow. Activity and Exercise  Exercise only as told by your doctor. Stop exercising if you have cramps or pain in your lower belly (abdomen) or low back.  Try to avoid standing for long periods of time. Move your legs often if you must stand in one place for a long time.  Avoid heavy lifting.  Wear low-heeled shoes. Sit and stand up straight.  You can have sex unless your doctor tells you not to. Relief of Pain or Discomfort  Wear a good support bra if your breasts are sore.  Take warm water baths (sitz baths) to soothe pain or discomfort caused by hemorrhoids. Use hemorrhoid cream if your  doctor says it is okay.  Rest with your legs raised if you have leg cramps or low back pain.  Wear support hose if you have puffy, bulging veins (varicose veins) in your legs. Raise (elevate) your feet for 15 minutes, 3-4 times a day. Limit salt in your diet. Prenatal Care  Schedule your prenatal visits by the twelfth week of pregnancy.  Write down your questions. Take them to your prenatal visits.  Keep all your prenatal visits as told by your doctor. Safety  Wear your seat belt at all times when driving.  Make a list of emergency phone numbers. The list should include numbers for family, friends, the hospital, and police and fire departments. General Tips  Ask your doctor for a referral to a local prenatal class. Begin classes no later than at the start of month 6 of your pregnancy.  Ask for help if you need counseling or help with nutrition. Your doctor can give you advice or tell you where to go for help.  Do not use hot tubs, steam rooms, or saunas.  Do not douche or use tampons or scented sanitary pads.  Do not cross your legs for long periods of time.  Avoid litter boxes and soil used by cats.  Avoid all smoking, herbs, and alcohol. Avoid drugs not approved by your doctor.  Visit your dentist. At home, brush your teeth   with a soft toothbrush. Be gentle when you floss. GET HELP IF:  You are dizzy.  You have mild cramps or pressure in your lower belly.  You have a nagging pain in your belly area.  You continue to feel sick to your stomach, throw up, or have watery poop (diarrhea).  You have a bad smelling fluid coming from your vagina.  You have pain with peeing (urination).  You have increased puffiness (swelling) in your face, hands, legs, or ankles. GET HELP RIGHT AWAY IF:   You have a fever.  You are leaking fluid from your vagina.  You have spotting or bleeding from your vagina.  You have very bad belly cramping or pain.  You gain or lose weight  rapidly.  You throw up blood. It may look like coffee grounds.  You are around people who have German measles, fifth disease, or chickenpox.  You have a very bad headache.  You have shortness of breath.  You have any kind of trauma, such as from a fall or a car accident. Document Released: 04/06/2008 Document Revised: 03/05/2014 Document Reviewed: 08/29/2013 Exit[employer] Patient Information 2015 [employer]. This information is not intended to replace advice given to you by your health care provider. Make sure you discuss any questions you have with your health care provider.  

## 2014-07-25 NOTE — Progress Notes (Signed)
[facility] [address] [place] [address] [phone]  Date:  07/25/2014   Name:  Chelsea Castaneda   DOB:  1981-04-20   MRN:  [id number]  PCP:  No PCP Per Patient    Chief Complaint: other, Abdominal Pain and Dizziness   History of Present Illness:  Chelsea Castaneda is a 33 y.o. very pleasant female patient who presents with the following:  Missed this month period.  Had IUD removed.   Positive HPT.  Wants confirmation No abnormal bleeding or pelvic pain. Denies other complaint or health concern today.   Patient Active Problem List   Diagnosis Date Noted  . Unspecified vitamin D deficiency 10/27/2013    Past Medical History  Diagnosis Date  . Tuberculosis     inactive  . Blood transfusion without reported diagnosis     no infusion reaction    Past Surgical History  Procedure Laterality Date  . Dilation and curettage of uterus      History  Substance Use Topics  . Smoking status: Never Smoker   . Smokeless tobacco: Never Used  . Alcohol Use: No    Family History  Problem Relation Age of Onset  . Diabetes Mother   . Hypertension Mother     No Known Allergies  Medication list has been reviewed and updated.  Current Outpatient Prescriptions on File Prior to Visit  Medication Sig Dispense Refill  . naproxen (NAPROSYN) 500 MG tablet Take 1 tablet (500 mg total) by mouth 2 (two) times daily. PRN pain  30 tablet  0  . Prenatal Vit-Fe Fumarate-FA (PRENATAL MULTIVITAMIN) TABS tablet Take 1 tablet by mouth daily at 12 noon.  30 tablet  6  . [DISCONTINUED] gabapentin (NEURONTIN) 100 MG capsule Take 1 capsule (100 mg total) by mouth 3 (three) times daily.  30 capsule  0   No current facility-administered medications on file prior to visit.    Review of Systems:  As per HPI, otherwise negative.    Physical Examination: Filed Vitals:   07/25/14 1547  BP: 110/68  Pulse: 95  Temp: 98.7 F (37.1 C)  Resp: 16   Filed  Vitals:   07/25/14 1547  Height:  (1.626 m)  Weight: 169 lb (76.658 kg)   Body mass index is 28.99 kg/(m^2). Ideal Body Weight: Weight in (lb) to have BMI = 25: 145.3   GEN: WDWN, NAD, Non-toxic, Alert & Oriented x 3 HEENT: Atraumatic, Normocephalic.  Ears and Nose: No external deformity. EXTR: No clubbing/cyanosis/edema NEURO: Normal gait.  PSYCH: Normally interactive. Conversant. Not depressed or anxious appearing.  Calm demeanor.    Assessment and Plan: Pregnancy Resume prenatal vitamins Follow up with OB/GYN  Signed,  Phillips Odor, MD

## 2014-08-21 ENCOUNTER — Telehealth: Payer: Self-pay | Admitting: *Deleted

## 2014-08-21 DIAGNOSIS — Z3490 Encounter for supervision of normal pregnancy, unspecified, unspecified trimester: Secondary | ICD-10-CM

## 2014-08-21 MED ORDER — OB COMPLETE PETITE 35-5-1-200 MG PO CAPS: 1.0000 | ORAL_CAPSULE | Freq: Every day | ORAL | Status: DC

## 2014-08-21 NOTE — Telephone Encounter (Signed)
Patient states she is having stomach pain and wants to know what she can do about it.  12:04 Patient states she is experiencing stomach pain when she eats and at other times too. She state it is not simular to reflux- but lower pain. Patient states she is moving her bowels normally and only has occasional nausea. Advised she can try Mylanta to settle her stomach and OTC treatments for nausea( B6 and dramamine). Patient will try these to see if she gets relief. Will call in PNV for her. Patient advised to wash her hands often during this time of year. Patient has NOB appointment scheduled.

## 2014-08-22 ENCOUNTER — Telehealth: Payer: Self-pay | Admitting: *Deleted

## 2014-08-22 NOTE — Telephone Encounter (Signed)
Pt called to office regarding use of antacid, is it safe to take. Return call pt.  Pt made aware that liquid antacid is safe to take during pregnancy. Pt also advised of other OTC mediations that she may be able to take.  Pt advised to make physician aware if OTC medication does not help her symptoms. Pt states understanding.

## 2014-10-18 ENCOUNTER — Telehealth: Payer: Self-pay | Admitting: *Deleted

## 2014-10-18 ENCOUNTER — Encounter: Payer: Medicaid Other | Admitting: Obstetrics & Gynecology

## 2014-10-18 NOTE — Telephone Encounter (Signed)
Patient contact the office regarding a NOB appointment. Attempted to contact the patient and left message for patient to call the office.

## 2014-10-19 NOTE — Telephone Encounter (Signed)
Patient returned call. Patient aware Dr. Tamela OddiJackson-Moore will no longer be with the practice and is in agreement to see Dr.Harper. Patient scheduled for her NOB visit on 11-12-14 @ 10:30

## 2014-10-23 ENCOUNTER — Other Ambulatory Visit: Payer: Self-pay

## 2014-10-23 ENCOUNTER — Inpatient Hospital Stay (HOSPITAL_COMMUNITY)
Admission: AD | Admit: 2014-10-23 | Discharge: 2014-10-23 | Disposition: A | Discharge status: Home / Self Care | Payer: BC Managed Care – PPO | Source: Ambulatory Visit | Attending: Family Medicine | Admitting: Family Medicine

## 2014-10-23 ENCOUNTER — Encounter (HOSPITAL_COMMUNITY): Payer: Self-pay | Admitting: *Deleted

## 2014-10-23 DIAGNOSIS — N949 Unspecified condition associated with female genital organs and menstrual cycle: Secondary | ICD-10-CM | POA: Insufficient documentation

## 2014-10-23 DIAGNOSIS — M545 Low back pain: Secondary | ICD-10-CM | POA: Diagnosis not present

## 2014-10-23 DIAGNOSIS — R109 Unspecified abdominal pain: Secondary | ICD-10-CM | POA: Insufficient documentation

## 2014-10-23 DIAGNOSIS — O2342 Unspecified infection of urinary tract in pregnancy, second trimester: Secondary | ICD-10-CM | POA: Diagnosis not present

## 2014-10-23 DIAGNOSIS — Z3A16 16 weeks gestation of pregnancy: Secondary | ICD-10-CM | POA: Insufficient documentation

## 2014-10-23 DIAGNOSIS — O26899 Other specified pregnancy related conditions, unspecified trimester: Secondary | ICD-10-CM

## 2014-10-23 DIAGNOSIS — R102 Pelvic and perineal pain: Secondary | ICD-10-CM

## 2014-10-23 LAB — URINALYSIS, ROUTINE W REFLEX MICROSCOPIC
Bilirubin Urine: NEGATIVE
GLUCOSE, UA: NEGATIVE mg/dL
HGB URINE DIPSTICK: NEGATIVE
Ketones, ur: 15 mg/dL — AB
Nitrite: NEGATIVE
Protein, ur: NEGATIVE mg/dL
Specific Gravity, Urine: 1.025 (ref 1.005–1.030)
Urobilinogen, UA: 0.2 mg/dL (ref 0.0–1.0)
pH: 6.5 (ref 5.0–8.0)

## 2014-10-23 LAB — URINE MICROSCOPIC-ADD ON

## 2014-10-23 LAB — WET PREP, GENITAL
Clue Cells Wet Prep HPF POC: NONE SEEN
Trich, Wet Prep: NONE SEEN
YEAST WET PREP: NONE SEEN

## 2014-10-23 LAB — OB RESULTS CONSOLE GC/CHLAMYDIA
CHLAMYDIA, DNA PROBE: NEGATIVE
Gonorrhea: NEGATIVE

## 2014-10-23 MED ORDER — CEPHALEXIN 500 MG PO CAPS: 500.0000 mg | ORAL_CAPSULE | Freq: Three times a day (TID) | ORAL | Status: DC

## 2014-10-23 NOTE — MAU Provider Note (Signed)
History     CSN: [id number]  Arrival date and time: 10/23/14 1637   None     Chief Complaint  Patient presents with  . Abdominal Pain  . Back Pain   HPI 33 y.o. [id number] at 1975w6d with bilateral lower abdominal pain for about 2 months, lumbar pain and sciatica, worsening over the past few days. States she has increased pain in her abdomen before and during urination.   Past Medical History  Diagnosis Date  . Tuberculosis     inactive  . Blood transfusion without reported diagnosis     no infusion reaction    Past Surgical History  Procedure Laterality Date  . Dilation and curettage of uterus    . Nose surgery      Family History  Problem Relation Age of Onset  . Diabetes Mother   . Hypertension Mother     History  Substance Use Topics  . Smoking status: Never Smoker   . Smokeless tobacco: Never Used  . Alcohol Use: No    Allergies: No Known Allergies  No prescriptions prior to admission    Review of Systems  Constitutional: Negative.   Respiratory: Negative.   Cardiovascular: Negative.   Gastrointestinal: Positive for abdominal pain. Negative for nausea, vomiting, diarrhea and constipation.  Genitourinary: Positive for dysuria. Negative for urgency, frequency, hematuria and flank pain.       Negative for vaginal bleeding, cramping/contractions  Musculoskeletal: Positive for back pain.  Neurological: Negative.   Psychiatric/Behavioral: Negative.    Physical Exam   Blood pressure 115/65, pulse 89, temperature 98.2 F (36.8 C), temperature source Oral, resp. rate 18, last menstrual period 06/27/2014, currently breastfeeding.  Physical Exam  Nursing note and vitals reviewed. Constitutional: She is oriented to person, place, and time. She appears well-developed and well-nourished. No distress.  Cardiovascular: Normal rate.   Respiratory: Effort normal.  GI: Soft. She exhibits no mass. There is no tenderness. There is no rebound, no guarding and no CVA  tenderness.  Genitourinary: Uterus is enlarged (c/w dates). Uterus is not tender. Cervix exhibits friability. Right adnexum displays tenderness. Right adnexum displays no mass and no fullness. Left adnexum displays tenderness. Left adnexum displays no mass and no fullness. No bleeding in the vagina. Vaginal discharge (white) found.  Cervix long and closed  Neurological: She is alert and oriented to person, place, and time.  Skin: Skin is warm and dry.  Psychiatric: She has a normal mood and affect.   + [facility] MAU Course  Procedures  Results for orders placed or performed during the hospital encounter of 10/23/14 (from the past 24 hour(s))  Urinalysis, Routine w reflex microscopic     Status: Abnormal   Collection Time: 10/23/14  5:05 PM  Result Value Ref Range   Color, Urine YELLOW YELLOW   APPearance CLEAR CLEAR   Specific Gravity, Urine 1.025 1.005 - 1.030   pH 6.5 5.0 - 8.0   Glucose, UA NEGATIVE NEGATIVE mg/dL   Hgb urine dipstick NEGATIVE NEGATIVE   Bilirubin Urine NEGATIVE NEGATIVE   Ketones, ur 15 (A) NEGATIVE mg/dL   Protein, ur NEGATIVE NEGATIVE mg/dL   Urobilinogen, UA 0.2 0.0 - 1.0 mg/dL   Nitrite NEGATIVE NEGATIVE   Leukocytes, UA MODERATE (A) NEGATIVE  Urine microscopic-add on     Status: Abnormal   Collection Time: 10/23/14  5:05 PM  Result Value Ref Range   Squamous Epithelial / LPF MANY (A) RARE   WBC, UA 11-20 <3 WBC/hpf   RBC / HPF  3-6 <3 RBC/hpf   Bacteria, UA MANY (A) RARE   Urine-Other MUCOUS PRESENT   Wet prep, genital     Status: Abnormal   Collection Time: 10/23/14  5:46 PM  Result Value Ref Range   Yeast Wet Prep HPF POC NONE SEEN NONE SEEN   Trich, Wet Prep NONE SEEN NONE SEEN   Clue Cells Wet Prep HPF POC NONE SEEN NONE SEEN   WBC, Wet Prep HPF POC FEW (A) NONE SEEN     Assessment and Plan   1. UTI in pregnancy, antepartum, second trimester   2. Pain of round ligament affecting pregnancy, antepartum   Pain c/w round ligament/normal pregnancy  discomfort, however pt does report some association of pain w/ urination and has + leuk on UA. Will treat for UTI and send culture. Precautions rev'd. F/U as scheduled for prenatal care.     Medication List    STOP taking these medications        naproxen 500 MG tablet  Commonly known as:  NAPROSYN     prenatal multivitamin Tabs tablet      TAKE these medications        cephALEXin 500 MG capsule  Commonly known as:  KEFLEX  Take 1 capsule (500 mg total) by mouth 3 (three) times daily.     miconazole 2 % vaginal cream  Commonly known as:  MONISTAT 7  Place 1 Applicatorful vaginally at bedtime.     OB COMPLETE PETITE 35-5-1-200 MG Caps  Take 1 tablet by mouth daily.        Follow-up Information    Follow up with HARPER,CHARLES A, MD.   Specialty:  Obstetrics and Gynecology   Why:  as scheduled   Contact information:   [address] [place] [address] [phone]         [facility] Center[facility]RAZIER,Pedro [place] 10/23/2014, 7:48 PM

## 2014-10-23 NOTE — MAU Note (Signed)
Lower abd pain for the last month, became much worse over the past 3 days.  Back pain for the past 2 months, has also become worse.  Pain is worse with urination.  Denies bleeding or LOF.  Starts PNC with [facility] on Jan 11.

## 2014-10-24 LAB — CULTURE, OB URINE: Colony Count: >=100000

## 2014-10-24 LAB — GC/CHLAMYDIA PROBE AMP
CT Probe RNA: NEGATIVE
GC Probe RNA: NEGATIVE

## 2014-10-29 ENCOUNTER — Encounter: Payer: Self-pay | Admitting: *Deleted

## 2014-10-30 ENCOUNTER — Encounter: Payer: Self-pay | Admitting: Obstetrics & Gynecology

## 2014-11-12 ENCOUNTER — Ambulatory Visit (INDEPENDENT_AMBULATORY_CARE_PROVIDER_SITE_OTHER): Payer: Medicaid Other | Admitting: Obstetrics

## 2014-11-12 ENCOUNTER — Encounter: Payer: Self-pay | Admitting: Obstetrics

## 2014-11-12 ENCOUNTER — Telehealth: Payer: Self-pay

## 2014-11-12 VITALS — BP 122/78 | HR 87 | Temp 97.2°F | Wt 179.0 lb

## 2014-11-12 DIAGNOSIS — Z331 Pregnant state, incidental: Secondary | ICD-10-CM

## 2014-11-12 DIAGNOSIS — Z3490 Encounter for supervision of normal pregnancy, unspecified, unspecified trimester: Secondary | ICD-10-CM

## 2014-11-12 DIAGNOSIS — O09522 Supervision of elderly multigravida, second trimester: Secondary | ICD-10-CM

## 2014-11-12 DIAGNOSIS — Z3482 Encounter for supervision of other normal pregnancy, second trimester: Secondary | ICD-10-CM

## 2014-11-12 LAB — POCT URINALYSIS DIPSTICK
BILIRUBIN UA: NEGATIVE
Glucose, UA: NEGATIVE
Ketones, UA: NEGATIVE
Leukocytes, UA: NEGATIVE
NITRITE UA: NEGATIVE
PROTEIN UA: NEGATIVE
RBC UA: NEGATIVE
Spec Grav, UA: 1.015
Urobilinogen, UA: NEGATIVE
pH, UA: 5

## 2014-11-12 MED ORDER — OB COMPLETE PETITE 35-5-1-200 MG PO CAPS: 1.0000 | ORAL_CAPSULE | Freq: Every day | ORAL | Status: DC

## 2014-11-12 NOTE — Progress Notes (Signed)
Subjective:    Chelsea Castaneda is a 34 y.o. female being seen today for her obstetrical visit. She is at 6158w5d gestation. Patient reports: no complaints . Fetal movement: normal.  Problem List Items Addressed This Visit    None    Visit Diagnoses    AMA (advanced maternal age) multigravida 35+, second trimester    -  Primary    Relevant Orders       AMB Referral to Maternal Fetal Medicine (MFM)    Encounter for supervision of other normal pregnancy in second trimester        Relevant Orders       Obstetric panel       HIV antibody       Hemoglobinopathy evaluation       Varicella zoster antibody, IgG       Vit D  25 hydroxy (rtn osteoporosis monitoring)       Culture, OB Urine       POCT urinalysis dipstick (Completed)       US OB Comp + 14 Wk       US OB Comp + [redacted] Wk    Pregnant        Relevant Medications       Prenat-FeCbn-FeAspGl-FA-Omega (OB COMPLETE PETITE) 35-5-1-200 MG CAPS      Patient Active Problem List   Diagnosis Date Noted  . Unspecified vitamin D deficiency 10/27/2013   Objective:    BP 122/78 mmHg  Pulse 87  Temp(Src) 97.2 F (36.2 C)  Wt 179 lb (81.194 kg)  LMP 06/27/2014 (Exact Date) FHT: 150 BPM  Uterine Size: size equals dates     Assessment:    Pregnancy @ 5958w5d     AMA  Plan:     Referred to MFM for AMA. Labs, problem list reviewed and updated 2 hr GTT planned Follow up in 4 weeks.

## 2014-11-12 NOTE — Telephone Encounter (Signed)
PATIENT KNOWS OF APPT WITH [facility] ON 11/20/14 @ 2:15PM - ARRIVE BY 2:00PM

## 2014-11-13 LAB — OBSTETRIC PANEL
Antibody Screen: NEGATIVE
BASOS ABS: 0 10*3/uL (ref 0.0–0.1)
Basophils Relative: 0 % (ref 0–1)
Eosinophils Absolute: 0.1 10*3/uL (ref 0.0–0.7)
Eosinophils Relative: 1 % (ref 0–5)
HCT: 35.8 % — ABNORMAL LOW (ref 36.0–46.0)
HEMOGLOBIN: 11.8 g/dL — AB (ref 12.0–15.0)
HEP B S AG: NEGATIVE
LYMPHS PCT: 23 % (ref 12–46)
Lymphs Abs: 1.4 10*3/uL (ref 0.7–4.0)
MCH: 29.6 pg (ref 26.0–34.0)
MCHC: 33 g/dL (ref 30.0–36.0)
MCV: 89.9 fL (ref 78.0–100.0)
MPV: 11.9 fL (ref 8.6–12.4)
Monocytes Absolute: 0.5 10*3/uL (ref 0.1–1.0)
Monocytes Relative: 8 % (ref 3–12)
NEUTROS PCT: 68 % (ref 43–77)
Neutro Abs: 4.1 10*3/uL (ref 1.7–7.7)
Platelets: 193 10*3/uL (ref 150–400)
RBC: 3.98 MIL/uL (ref 3.87–5.11)
RDW: 13.6 % (ref 11.5–15.5)
RUBELLA: 1.13 {index} — AB (ref <0.90)
Rh Type: POSITIVE
WBC: 6 10*3/uL (ref 4.0–10.5)

## 2014-11-13 LAB — VITAMIN D 25 HYDROXY (VIT D DEFICIENCY, FRACTURES): Vit D, 25-Hydroxy: 14 ng/mL — ABNORMAL LOW (ref 30–100)

## 2014-11-13 LAB — VARICELLA ZOSTER ANTIBODY, IGG: Varicella IgG: 2692 Index — ABNORMAL HIGH (ref <135.00)

## 2014-11-13 LAB — HIV ANTIBODY (ROUTINE TESTING W REFLEX): HIV 1&2 Ab, 4th Generation: NONREACTIVE

## 2014-11-14 LAB — AFP, QUAD SCREEN
AFP: 34.1 ng/mL
Age Alone: 1:269 {titer}
CURR GEST AGE: 19.5 wks.days
Down Syndrome Scr Risk Est: 1:1640 {titer}
HCG, Total: 13.1 IU/mL
INH: 147.1 pg/mL
INTERPRETATION-AFP: NEGATIVE
MOM FOR AFP: 0.69
MoM for INH: 0.88
MoM for hCG: 0.63
Open Spina bifida: NEGATIVE
Osb Risk: <1:27300 {titer}
Tri 18 Scr Risk Est: NEGATIVE
UE3 MOM: 1.35
UE3 VALUE: 2.32 ng/mL

## 2014-11-14 LAB — HEMOGLOBINOPATHY EVALUATION
HEMOGLOBIN OTHER: 0 %
HGB A: 96.8 % (ref 96.8–97.8)
HGB F QUANT: 0.4 % (ref 0.0–2.0)
Hgb A2 Quant: 2.8 % (ref 2.2–3.2)
Hgb S Quant: 0 %

## 2014-11-14 LAB — CULTURE, OB URINE

## 2014-11-19 ENCOUNTER — Ambulatory Visit (HOSPITAL_COMMUNITY): Payer: Medicaid Other

## 2014-11-20 ENCOUNTER — Ambulatory Visit (HOSPITAL_COMMUNITY)
Admission: RE | Admit: 2014-11-20 | Discharge: 2014-11-20 | Disposition: A | Discharge status: Home / Self Care | Payer: Medicaid Other | Source: Ambulatory Visit | Attending: Obstetrics | Admitting: Obstetrics

## 2014-11-20 ENCOUNTER — Other Ambulatory Visit: Payer: Self-pay | Admitting: Obstetrics

## 2014-11-20 ENCOUNTER — Encounter (HOSPITAL_COMMUNITY): Payer: Self-pay

## 2014-11-20 DIAGNOSIS — O09522 Supervision of elderly multigravida, second trimester: Secondary | ICD-10-CM | POA: Insufficient documentation

## 2014-11-20 DIAGNOSIS — Z3482 Encounter for supervision of other normal pregnancy, second trimester: Secondary | ICD-10-CM

## 2014-11-20 DIAGNOSIS — Z36 Encounter for antenatal screening of mother: Secondary | ICD-10-CM | POA: Diagnosis not present

## 2014-11-20 DIAGNOSIS — Z3A20 20 weeks gestation of pregnancy: Secondary | ICD-10-CM | POA: Diagnosis not present

## 2014-11-20 NOTE — Progress Notes (Signed)
Genetic Counseling  High-Risk Gestation Note  Appointment Date:  11/20/2014 Referred By: Brock BadHarper, Charles A, MD Date of Birth:  October 06, 1981   Pregnancy History: [id number] Estimated Date of Delivery: 04/03/15 Estimated Gestational Age: 8141w6d Attending: Particia NearingMartha Decker, MD   Chelsea Castaneda was seen for genetic counseling regarding a maternal age of 34 y.o.. [employer] Genetic Counseling Intern, Chelsey Burden, assisted with genetic counseling under my direct supervision.   She was counseled regarding maternal age and the association with risk for chromosome conditions due to nondisjunction with aging of the ova.  We reviewed chromosomes, nondisjunction, and the associated 1 in 111 risk for fetal aneuploidy at 7141w6d gestation related to a maternal age of 34 years old at delivery.  She was counseled that the risk for aneuploidy decreases as gestational age increases, accounting for those pregnancies which spontaneously abort.  We specifically discussed Down syndrome (trisomy 4521), trisomies 3413 and 7918, and sex chromosome aneuploidies (47,XXX and 47,XXY) including the common features and prognoses of each.   We also reviewed Chelsea Castaneda's maternal serum Quad screen result and the associated reduction in risks for fetal Down syndrome (1 in 269 to 1 in 1640), trisomy 18 (1 in 7530), and ONTDs (less than 1 in 27,300).  She understands that Quad screening provides a pregnancy specific risk for Down syndrome, but is not considered to be diagnostic.    We reviewed additional available screening options including noninvasive prenatal screening (NIPS)/cell free DNA (cfDNA) testing and detailed ultrasound.  She was counseled that screening tests are used to modify a patient's a priori risk for aneuploidy, typically based on age. This estimate provides a pregnancy specific risk assessment. We reviewed the benefits and limitations of each option. Specifically, we discussed the conditions for which each test screens,  the detection rates, and false positive rates of each. She was also counseled regarding diagnostic testing via amniocentesis. We reviewed the approximate 1 in 300-500 risk for complications for amniocentesis, including spontaneous pregnancy loss.   Targeted ultrasound was performed at the time of today's visit. Complete ultrasound results reported separately. Visualized fetal anatomy was normal. After consideration of all the options, she declined NIPS and amniocentesis. She stated that she was comfortable with the risk assessment provided by ultrasound and Quad screening. She understands that screening tests cannot rule out all birth defects or genetic syndromes. The patient was advised of this limitation and states she still does not want additional testing at this time.    Both family histories were reviewed and found to be noncontributory for birth defects, mental retardation, and known genetic conditions. Without further information regarding the provided family history, an accurate genetic risk cannot be calculated. Further genetic counseling is warranted if more information is obtained.   Chelsea Castaneda denied exposure to environmental toxins or chemical agents. She denied the use of alcohol, tobacco or street drugs. She denied significant viral illnesses during the course of her pregnancy. Her medical and surgical histories were noncontributory.    I counseled this Chelsea Castaneda regarding the above risks and available options.  The approximate face-to-face time with the genetic counselor was 40 minutes.  Chelsea PlowmanKaren Shaterica Mcclatchy, MS Certified Genetic Counselor 11/20/2014

## 2014-11-22 DIAGNOSIS — O09529 Supervision of elderly multigravida, unspecified trimester: Secondary | ICD-10-CM | POA: Insufficient documentation

## 2014-11-22 DIAGNOSIS — Z3A20 20 weeks gestation of pregnancy: Secondary | ICD-10-CM | POA: Insufficient documentation

## 2014-11-26 ENCOUNTER — Inpatient Hospital Stay (HOSPITAL_COMMUNITY)
Admission: AD | Admit: 2014-11-26 | Discharge: 2014-11-28 | DRG: 782 | Disposition: A | Discharge status: Home / Self Care | Payer: Medicaid Other | Source: Ambulatory Visit | Attending: Obstetrics | Admitting: Obstetrics

## 2014-11-26 ENCOUNTER — Other Ambulatory Visit: Payer: Self-pay | Admitting: Obstetrics

## 2014-11-26 ENCOUNTER — Encounter (HOSPITAL_COMMUNITY): Payer: Self-pay | Admitting: *Deleted

## 2014-11-26 DIAGNOSIS — Z3A21 21 weeks gestation of pregnancy: Secondary | ICD-10-CM | POA: Diagnosis present

## 2014-11-26 DIAGNOSIS — O209 Hemorrhage in early pregnancy, unspecified: Secondary | ICD-10-CM | POA: Diagnosis not present

## 2014-11-26 DIAGNOSIS — O4400 Placenta previa specified as without hemorrhage, unspecified trimester: Secondary | ICD-10-CM | POA: Insufficient documentation

## 2014-11-26 DIAGNOSIS — Z8249 Family history of ischemic heart disease and other diseases of the circulatory system: Secondary | ICD-10-CM | POA: Diagnosis not present

## 2014-11-26 DIAGNOSIS — Z833 Family history of diabetes mellitus: Secondary | ICD-10-CM

## 2014-11-26 DIAGNOSIS — O4412 Placenta previa with hemorrhage, second trimester: Secondary | ICD-10-CM | POA: Diagnosis present

## 2014-11-26 DIAGNOSIS — O4410 Placenta previa with hemorrhage, unspecified trimester: Secondary | ICD-10-CM | POA: Diagnosis present

## 2014-11-26 DIAGNOSIS — Z3A22 22 weeks gestation of pregnancy: Secondary | ICD-10-CM | POA: Insufficient documentation

## 2014-11-26 LAB — CBC
HCT: 33 % — ABNORMAL LOW (ref 36.0–46.0)
HEMOGLOBIN: 11.1 g/dL — AB (ref 12.0–15.0)
MCH: 30.3 pg (ref 26.0–34.0)
MCHC: 33.6 g/dL (ref 30.0–36.0)
MCV: 90.2 fL (ref 78.0–100.0)
Platelets: 171 10*3/uL (ref 150–400)
RBC: 3.66 MIL/uL — AB (ref 3.87–5.11)
RDW: 13.8 % (ref 11.5–15.5)
WBC: 6.7 10*3/uL (ref 4.0–10.5)

## 2014-11-26 MED ORDER — PRENATAL MULTIVITAMIN CH
1.0000 | ORAL_TABLET | Freq: Every day | ORAL | Status: DC
  Administered 2014-11-27: 1 via ORAL
  Filled 2014-11-26: qty 1

## 2014-11-26 MED ORDER — CALCIUM CARBONATE ANTACID 500 MG PO CHEW: 2.0000 | CHEWABLE_TABLET | ORAL | Status: DC | PRN

## 2014-11-26 MED ORDER — ACETAMINOPHEN 325 MG PO TABS: 650.0000 mg | ORAL_TABLET | ORAL | Status: DC | PRN

## 2014-11-26 MED ORDER — ZOLPIDEM TARTRATE 5 MG PO TABS: 5.0000 mg | ORAL_TABLET | Freq: Every evening | ORAL | Status: DC | PRN

## 2014-11-26 MED ORDER — LACTATED RINGERS IV SOLN
INTRAVENOUS | Status: DC
Start: 2014-11-26 — End: 2014-11-28
  Administered 2014-11-26 – 2014-11-28 (×4): via INTRAVENOUS

## 2014-11-26 MED ORDER — DOCUSATE SODIUM 100 MG PO CAPS
100.0000 mg | ORAL_CAPSULE | Freq: Every day | ORAL | Status: DC
  Administered 2014-11-27 – 2014-11-28 (×2): 100 mg via ORAL
  Filled 2014-11-26 (×2): qty 1

## 2014-11-26 NOTE — H&P (Signed)
Chelsea Castaneda is a 34 y.o. female presenting for vaginal bleeding after intercourse.  Has anterior placenta previa. Maternal Medical History:  Reason for admission: Vaginal bleeding.   Fetal activity: Perceived fetal activity is normal.   Last perceived fetal movement was within the past hour.    Prenatal complications: Placental abnormality.     OB History    Gravida Para Term Preterm AB TAB SAB Ectopic Multiple Living   4 3 3       3      Past Medical History  Diagnosis Date  . Tuberculosis     inactive  . Blood transfusion without reported diagnosis     no infusion reaction   Past Surgical History  Procedure Laterality Date  . Dilation and curettage of uterus    . Nose surgery     Family History: family history includes Diabetes in her mother; Hypertension in her mother. Social History:  reports that she has never smoked. She has never used smokeless tobacco. She reports that she does not drink alcohol or use illicit drugs.   Prenatal Transfer Tool  Maternal Diabetes: No Genetic Screening: Declined Maternal Ultrasounds/Referrals: Abnormal:  Findings:   Other: Fetal Ultrasounds or other Referrals:  Referred to Materal Fetal Medicine  Maternal Substance Abuse:  No Significant Maternal Medications:  None Significant Maternal Lab Results:  None Other Comments:  None  Review of Systems  All other systems reviewed and are negative.   Dilation: Closed Exam by:: E. Key, NP Blood pressure 122/64, pulse 92, temperature 98.3 F (36.8 C), temperature source Oral, resp. rate 18, height 5' 3.5" (1.613 m), weight 187 lb (84.823 kg), last menstrual period 06/27/2014, currently breastfeeding. Maternal Exam:  Abdomen: Patient reports no abdominal tenderness. Cervix: Cervix evaluated by sterile speculum exam.     Physical Exam  Nursing note and vitals reviewed. Constitutional: She is oriented to person, place, and time. She appears well-developed and well-nourished.   HENT:  Head: Normocephalic and atraumatic.  Eyes: Conjunctivae are normal. Pupils are equal, round, and reactive to light.  Neck: Normal range of motion. Neck supple.  Cardiovascular: Normal rate and regular rhythm.   Respiratory: Effort normal and breath sounds normal.  GI: Soft. Bowel sounds are normal.  Genitourinary: Vagina normal and uterus normal.  Musculoskeletal: Normal range of motion.  Neurological: She is alert and oriented to person, place, and time.  Skin: Skin is warm and dry.  Psychiatric: She has a normal mood and affect. Her behavior is normal. Judgment and thought content normal.    Prenatal labs: ABO, Rh: O/POS/-- (01/11 1309) Antibody: NEG (01/11 1309) Rubella: 1.13 (01/11 1309) RPR: NON REAC (01/11 1309)  HBsAg: NEGATIVE (01/11 1309)  HIV: NONREACTIVE (01/11 1309)  GBS:     Assessment/Plan: 21 weeks.  Anterior placenta previa.  Vaginal bleeding after intercourse.   HARPER,CHARLES A 11/26/2014, 7:07 PM

## 2014-11-26 NOTE — MAU Note (Signed)
Started bleeding 2 hrs ago.  First episode of bleeding this preg. ? Something on US last wk with placenta.

## 2014-11-26 NOTE — Plan of Care (Signed)
Problem: Phase I Progression Outcomes Goal: OOB as tolerated unless otherwise ordered Outcome: Completed/Met Date Met:  11/26/14 Bedrest with BR privileges

## 2014-11-26 NOTE — MAU Note (Signed)
Anterior previa, was told not to have sex.  Did so on Sat or Sunday.  Has repeat US scheduled in 5 wks

## 2014-11-26 NOTE — MAU Provider Note (Signed)
  History     CSN: [id number]  Arrival date and time: 11/26/14 1654   First Provider Initiated Contact with Patient 11/26/14 1756      Chief Complaint  Patient presents with  . Vaginal Bleeding   HPI Chelsea Castaneda is 34 y.o. [id number] 469w5d weeks presenting with vaginal bleeding.  She is a patient of Dr. Verdell CarmineHarper's.  She was seen by MFM on 1/19--U/S at that date showed 6342w2d gestation with ANTERIOR PREVIA.  Cervical length was 4.2 cm.  She had intercourse 2 nights ago.  Bleeding began today.  She denies pain.   Past Medical History  Diagnosis Date  . Tuberculosis     inactive  . Blood transfusion without reported diagnosis     no infusion reaction    Past Surgical History  Procedure Laterality Date  . Dilation and curettage of uterus    . Nose surgery      Family History  Problem Relation Age of Onset  . Diabetes Mother   . Hypertension Mother     History  Substance Use Topics  . Smoking status: Never Smoker   . Smokeless tobacco: Never Used  . Alcohol Use: No    Allergies: No Known Allergies  Prescriptions prior to admission  Medication Sig Dispense Refill Last Dose  . Prenat-FeCbn-FeAspGl-FA-Omega (OB COMPLETE PETITE) 35-5-1-200 MG CAPS Take 1 tablet by mouth daily. 90 capsule 3 11/26/2014 at Unknown time    Review of Systems  Constitutional: Negative for fever and chills.  Cardiovascular: Negative for chest pain.  Gastrointestinal: Negative for nausea, vomiting and abdominal pain.  Genitourinary:       + for vaginal bleeding  Neurological: Negative for headaches.   Physical Exam   Blood pressure 122/64, pulse 92, temperature 98.3 F (36.8 C), temperature source Oral, resp. rate 18, height 5' 3.5" (1.613 m), weight 187 lb (84.823 kg), last menstrual period 06/27/2014, currently breastfeeding.  Physical Exam  Nursing note and vitals reviewed. Constitutional: She is oriented to person, place, and time. She appears well-developed and well-nourished. No  distress.  HENT:  Head: Normocephalic.  Cardiovascular: Normal rate.   Respiratory: Effort normal.  GI: There is no tenderness.  Genitourinary: There is no rash, tenderness or lesion on the right labia. There is no rash, tenderness or lesion on the left labia. Uterus is enlarged. Uterus is not tender. Left adnexum displays mass. No tenderness in the vagina. Bleeding: moderate amount of bright red blood, without clot.  Neurological: She is alert and oriented to person, place, and time.  Skin: Skin is warm and dry.  Psychiatric: She has a normal mood and affect. Her behavior is normal.    MAU Course  Procedures  MDM Reported MSE, US results from 1/19 to Dr. Clearance CootsHarper.  He plans to admit her.  Dr. Clearance CootsHarper will put admit orders in. Assessment and Plan  A:  Vaginal bleeding at 299w5d gestation with known anterior placenta previa  P:  ADMIT  KEY,EVE M 11/26/2014, 6:02 PM

## 2014-11-27 NOTE — Progress Notes (Signed)
Patient ID: Chelsea ChingSafaa Abou Ennassre, female   DOB: 29-Dec-1980, 34 y.o.   MRN: [id number] Hospital Day: 2  S: Preterm labor symptoms: heavy bleeding  O: Blood pressure 109/56, pulse 77, temperature 98.2 F (36.8 C), temperature source Oral, resp. rate 18, height 5' 3.5" (1.613 m), weight 180 lb 8 oz (81.874 kg), last menstrual period 06/27/2014, SpO2 100 %, currently breastfeeding.   WUJ:WJXBJYNWFHT:Baseline: 140 bpm Toco: None GNF:AOZHYQMVSVE:Dilation: Closed Exam by:: E. Key, NP  A/P- 34 y.o. admitted with: Vaginal bleeding.  Placenta previa, diagnosed on U/S done 11-20-14.  Previa precautions given. Had intercourse 2 days ago.  Onset of vaginal bleeding last night.  Stable this morning with scant vaginal bleeding and no UC's.   Present on Admission:  . Placenta previa with hemorrhage, antepartum  Pregnancy Complications: placenta previa  Preterm labor management: no treatment necessary Dating:  5469w6d PNL Needed:  CBC FWB:  good PTL:  Stable

## 2014-11-28 ENCOUNTER — Inpatient Hospital Stay (HOSPITAL_COMMUNITY): Payer: Medicaid Other

## 2014-11-28 DIAGNOSIS — O4400 Placenta previa specified as without hemorrhage, unspecified trimester: Secondary | ICD-10-CM | POA: Insufficient documentation

## 2014-11-28 DIAGNOSIS — Z3A22 22 weeks gestation of pregnancy: Secondary | ICD-10-CM | POA: Insufficient documentation

## 2014-11-28 MED ORDER — DOCUSATE SODIUM 100 MG PO CAPS: 100.0000 mg | ORAL_CAPSULE | Freq: Every day | ORAL | Status: DC

## 2014-11-28 MED ORDER — SODIUM CHLORIDE 0.9 % IJ SOLN
3.0000 mL | Freq: Two times a day (BID) | INTRAMUSCULAR | Status: DC
  Administered 2014-11-28: 3 mL via INTRAVENOUS

## 2014-11-28 NOTE — Consult Note (Signed)
MFM Consult Note:  Impressions: -SIUP at 22+0 weeks -cephalic fetus, active -Normal amniotic fluid volume  -cervix is long and closed measuring 4.5 cm -anterior partial previa; edge ends at internal os; precautions given -at fundal end of placenta there is a small subchorionic collection (aka, marginal abruption -patient reports no active bleeding since 11/26/14 following intercourse.  she knows this is contraindicated with placenta previa  Recommendations: 1. pelvic rest (NO INTERCOURSE OR VAGINAL PENETRATION EMPHASIZED TO THE PATIENT); if exam needed clinically speculum exam or TVUS are appropriate but no digital exam. 2. bleeding precautions 3. cervical length and course of BMZ in 2 weeks given marginal abruption ([facility]) 4. interval growth q4 weeks ([facility])  Time Spent: I spent in excess of 20 minutes in consultation with this patient to review records, evaluate her case, and provide her with an adequate discussion and education.  More than 50% of this time was spent in direct face-to-face counseling. It was a pleasure seeing your patient in the office today.  Thank you for consultation. Please do not hesitate to contact our service for any further questions. D/w'd Dr. Clearance CootsHarper.  Thank you,  Louann SjogrenJeffrey Morgan Gaynelle Arabianenney   Denney, Louann SjogrenJeffrey Morgan, MD, MS, FACOG Assistant Professor Section of Maternal-Fetal Medicine [facility]

## 2014-11-28 NOTE — Progress Notes (Signed)
Patient ID: Carman ChingSafaa Abou Ennassre, female   DOB: 1981/07/17, 34 y.o.   MRN: [id number] Hospital Day: 3  S: Preterm labor symptoms: light bleeding  O: Blood pressure 109/93, pulse 85, temperature 98 F (36.7 C), temperature source Oral, resp. rate 18, height 5' 3.5" (1.613 m), weight 180 lb 8 oz (81.874 kg), last menstrual period 06/27/2014, SpO2 100 %, currently breastfeeding.   WUX:LKGMWNUUFHT:Baseline: 140 bpm Toco: None VOZ:DGUYQIHKSVE:Dilation: Closed Exam by:: E. Key, NP  A/P- 34 y.o. admitted with: Vaginal bleeding after intercourse.  Stable.  MFM consult.  Present on Admission:  . Placenta previa with hemorrhage, antepartum  Pregnancy Complications: placenta previa  Preterm labor management: no treatment necessary Dating:  7152w0d PNL Needed:  none FWB:  good PTL:  stable

## 2014-11-28 NOTE — Progress Notes (Signed)
MFM Consult Note:  Impressions: -SIUP at 22+0 weeks -cephalic fetus, active -Normal amniotic fluid volume  -cervix is long and closed measuring 4.5 cm -anterior partial previa; edge ends at internal os; precautions given -at fundal end of placenta there is a small subchorionic collection (aka, marginal abruption -patient reports no active bleeding since 11/26/14 following intercourse.  she knows this is contraindicated with placenta previa  Recommendations: 1. pelvic rest (NO INTERCOURSE OR VAGINAL PENETRATION EMPHASIZED TO THE PATIENT); if exam needed clinically speculum exam or TVUS are appropriate but no digital exam. 2. bleeding precautions 3. cervical length and course of BMZ in 2 weeks given marginal abruption (arranged at [facility] with MFM) 4. interval growth q4 weeks (arranged at [facility] with [facility])  Time Spent: I spent in excess of 20 minutes in consultation with this patient to review records, evaluate her case, and provide her with an adequate discussion and education.  More than 50% of this time was spent in direct face-to-face counseling. It was a pleasure seeing your patient in the office today.  Thank you for consultation. Please do not hesitate to contact our service for any further questions. D/w'd Dr. Harper.  Thank you,  Mariely Mahr Morgan Laquitha Heslin   Shameeka Silliman Morgan, MD, MS, FACOG Assistant Professor Section of Maternal-Fetal Medicine [employer]   

## 2014-11-28 NOTE — Progress Notes (Signed)
Discharge instructions reviewed with patient.  Patient states understanding of home care, medications, activity, signs/symptoms to report to MD and return MD office visit.  Patients significant other and family will assist with her care @ home.  No home equipment needed, patient has all personal belongings.  Patient discharged  in stable condition per wheelchair with staff without incident.

## 2014-11-28 NOTE — Progress Notes (Signed)
Pt was in good spirits when I spoke with her.  Her husband was resting in the room and their three children were in the room as well.  She was pleasant and easy to talk with.  She stated that her physician gave her some very specific instructions and that she plans to follow them closely.    She did not state any particular spiritual or emotional needs at this time, but is aware of our continued availability.  Centex CorporationChaplain Katy Starlette Thurow Pager, [phone] 1:21 PM    11/28/14 1300  Clinical Encounter Type  Visited With Patient  Visit Type Spiritual support;Initial

## 2014-11-28 NOTE — Discharge Instructions (Signed)
Placenta Previa  Placenta previa is a condition in pregnant women where the placenta implants in the lower part of the uterus. The placenta either partially or completely covers the opening to the cervix. This is a problem because the baby must pass through the cervix during delivery. There are three types of placenta previa. They include:   Marginal placenta previa. The placenta is near the cervix, but does not cover the opening.  Partial placenta previa. The placenta covers part of the cervical opening.  Complete placenta previa. The placenta covers the entire cervical opening.  Depending on the type of placenta previa, there is a chance the placenta may move into a normal position and no longer cover the cervix as the pregnancy progresses. It is important to keep all prenatal visits with your caregiver.  RISK FACTORS You may be more likely to develop placenta previa if you:   Are carrying more than one baby (multiples).   Have an abnormally shaped uterus.   Have scars on the lining of the uterus.   Had previous surgeries involving the uterus, such as a cesarean delivery.   Have delivered a baby previously.   Have a history of placenta previa.   Have smoked or used cocaine during pregnancy.   Are age 36 or older during pregnancy.  SYMPTOMS The main symptom of placenta previa is sudden, painless vaginal bleeding during the second half of pregnancy. The amount of bleeding can be light to very heavy. The bleeding may stop on its own, but almost always returns. Cramping, regular contractions, abdominal pain, and lower back pain can also occur with placenta previa.  DIAGNOSIS Placenta previa can be diagnosed through an ultrasound by finding where the placenta is located. The ultrasound may find placenta previa either during a routine prenatal visit or after vaginal bleeding is noticed. If you are diagnosed with placenta previa, your caregiver may avoid vaginal exams to reduce the  risk of heavy bleeding. There is a chance that placenta previa may not be diagnosed until bleeding occurs during labor.  TREATMENT Specific treatment depends on:   How much you are bleeding or if the bleeding has stopped.  How far along you are in your pregnancy.   The condition of the baby.   The location of the baby and placenta.   The type of placenta previa.  Depending on the factors above, your caregiver may recommend:   Decreased activity.   Bed rest at home or in the hospital.  Pelvic rest. This means no sex, using tampons, douching, pelvic exams, or placing anything into the vagina.  A blood transfusion to replace maternal blood loss.  A cesarean delivery if the bleeding is heavy and cannot be controlled or the placenta completely covers the cervix.  Medication to stop premature labor or mature the fetal lungs if delivery is needed before the pregnancy is full term.  WHEN SHOULD YOU SEEK IMMEDIATE MEDICAL CARE IF YOU ARE SENT HOME WITH PLACENTA PREVIA? Seek immediate medical care if you show any symptoms of placenta previa. You will need to go to the hospital to get checked immediately. Again, those symptoms are:  Sudden, painless vaginal bleeding, even a small amount.  Cramping or regular contractions.  Lower back or abdominal pain. Document Released: 10/19/2005 Document Revised: 06/21/2013 Document Reviewed: 01/20/2013 [facility] Patient Information 2015 [place], [place]. This information is not intended to replace advice given to you by your health care provider. Make sure you discuss any questions you have with your health  care provider.  Vaginal Bleeding During Pregnancy, Second Trimester  A small amount of bleeding (spotting) from the vagina is common in pregnancy. Sometimes the bleeding is normal and is not a problem, and sometimes it is a sign of something serious. Be sure to tell your doctor about any bleeding from your vagina right away. HOME CARE  Watch  your condition for any changes.  Follow your doctor's instructions about how active you can be.  If you are on bed rest:  You may need to stay in bed and only get up to use the bathroom.  You may be allowed to do some activities.  If you need help, make plans for someone to help you.  Write down:  The number of pads you use each day.  How often you change pads.  How soaked (saturated) your pads are.  Do not use tampons.  Do not douche.  Do not have sex or orgasms until your doctor says it is okay.  If you pass any tissue from your vagina, save the tissue so you can show it to your doctor.  Only take medicines as told by your doctor.  Do not take aspirin because it can make you bleed.  Do not exercise, lift heavy weights, or do any activities that take a lot of energy and effort unless your doctor says it is okay.  Keep all follow-up visits as told by your doctor. GET HELP IF:   You bleed from your vagina.  You have cramps.  You have labor pains.  You have a fever that does not go away after you take medicine. GET HELP RIGHT AWAY IF:  You have very bad cramps in your back or belly (abdomen).  You have contractions.  You have chills.  You pass large clots or tissue from your vagina.  You bleed more.  You feel light-headed or weak.  You pass out (faint).  You are leaking fluid or have a gush of fluid from your vagina. MAKE SURE YOU:  Understand these instructions.  Will watch your condition.  Will get help right away if you are not doing well or get worse. Document Released: 03/05/2014 Document Reviewed: 06/26/2013 [facility] Patient Information 2015 [place]. This information is not intended to replace advice given to you by your health care provider. Make sure you discuss any questions you have with your health care provider.  Pelvic Rest Pelvic rest is sometimes recommended for women when:   The placenta is partially or completely  covering the opening of the cervix (placenta previa).  There is bleeding between the uterine wall and the amniotic sac in the first trimester (subchorionic hemorrhage).  The cervix begins to open without labor starting (incompetent cervix, cervical insufficiency).  The labor is too early (preterm labor). HOME CARE INSTRUCTIONS  Do not have sexual intercourse, stimulation, or an orgasm.  Do not use tampons, douche, or put anything in the vagina.  Do not lift anything over 10 pounds (4.5 kg).  Avoid strenuous activity or straining your pelvic muscles. SEEK MEDICAL CARE IF:  You have any vaginal bleeding during pregnancy. Treat this as a potential emergency.  You have cramping pain felt low in the stomach (stronger than menstrual cramps).  You notice vaginal discharge (watery, mucus, or bloody).  You have a low, dull backache.  There are regular contractions or uterine tightening. SEEK IMMEDIATE MEDICAL CARE IF: You have vaginal bleeding and have placenta previa.  Document Released: 02/13/2011 Document Revised: 01/11/2012 Document Reviewed: 02/13/2011  ExitCare® Patient Information ©2015 [employer], [employer]. This information is not intended to replace advice given to you by your health care provider. Make sure you discuss any questions you have with your health care provider. ° °

## 2014-11-28 NOTE — Discharge Summary (Signed)
Physician Discharge Summary  Patient ID: Chelsea ChingSafaa Abou Ennassre MRN: [id number] DOB/AGE: 04/18/1981 33 y.o.  Admit date: 11/26/2014 Discharge date: 11/28/2014  Admission Diagnoses: Vaginal Bleeding                                         Placenta Previa  Discharge Diagnoses:  Active Problems:   Placenta previa with hemorrhage, antepartum   Placenta previa   [redacted] weeks gestation of pregnancy   Discharged Condition: good  Hospital Course: Admitted with vaginal bleeding after intercourse.  H/O placenta previa.  Bleeding stopped within 24 hours after admission.  MFM consultation done.  Patient discharged home with instructions.  Consults: MFM  Significant Diagnostic Studies: radiology: Ultrasound: Marginal Previa.    Treatments: IV hydration and bedrest  Discharge Exam: Blood pressure 105/67, pulse 84, temperature 98.4 F (36.9 C), temperature source Oral, resp. rate 18, height 5' 3.5" (1.613 m), weight 180 lb 8 oz (81.874 kg), last menstrual period 06/27/2014, SpO2 98 %, currently breastfeeding. General appearance: alert and no distress Abdomen:  Soft.  Uterus nontender Vagina:  Dry, no active bleeding  Disposition: 01-Home or Self Care  Discharge Instructions    Discharge activity:    Complete by:  As directed      Discharge diet:  No restrictions    Complete by:  As directed      Do not have sex or do anything that might make you have an orgasm    Complete by:  As directed      Notify physician for a general feeling that "something is not right"    Complete by:  As directed      Notify physician for increase or change in vaginal discharge    Complete by:  As directed      Notify physician for intestinal cramps, with or without diarrhea, sometimes described as "gas pain"    Complete by:  As directed      Notify physician for leaking of fluid    Complete by:  As directed      Notify physician for low, dull backache, unrelieved by heat or Tylenol    Complete by:  As directed       Notify physician for menstrual like cramps    Complete by:  As directed      Notify physician for pelvic pressure    Complete by:  As directed      Notify physician for uterine contractions.  These may be painless and feel like the uterus is tightening or the baby is  "balling up"    Complete by:  As directed      Notify physician for vaginal bleeding    Complete by:  As directed      PRETERM LABOR:  Includes any of the follwing symptoms that occur between 20 - [redacted] weeks gestation.  If these symptoms are not stopped, preterm labor can result in preterm delivery, placing your baby at risk    Complete by:  As directed             Medication List    TAKE these medications        docusate sodium 100 MG capsule  Commonly known as:  COLACE  Take 1 capsule (100 mg total) by mouth at bedtime.     OB COMPLETE PETITE 35-5-1-200 MG Caps  Take 1 tablet by mouth daily.  Follow-up Information    Follow up with Lucylle Foulkes A, MD In 2 weeks.   Specialty:  Obstetrics and Gynecology   Contact information:   [address] [place] [address] [phone]       Signed: Brock Bad 11/28/2014, 12:56 PM

## 2014-11-30 ENCOUNTER — Other Ambulatory Visit: Payer: Self-pay | Admitting: Obstetrics

## 2014-11-30 DIAGNOSIS — O09522 Supervision of elderly multigravida, second trimester: Secondary | ICD-10-CM

## 2014-11-30 DIAGNOSIS — Z3482 Encounter for supervision of other normal pregnancy, second trimester: Secondary | ICD-10-CM

## 2014-12-08 ENCOUNTER — Inpatient Hospital Stay (HOSPITAL_COMMUNITY)
Admission: RE | Admit: 2014-12-08 | Discharge: 2014-12-08 | Disposition: A | Discharge status: Home / Self Care | Payer: Medicaid Other | Source: Ambulatory Visit | Attending: Obstetrics | Admitting: Obstetrics

## 2014-12-08 ENCOUNTER — Encounter (HOSPITAL_COMMUNITY): Payer: Self-pay | Admitting: *Deleted

## 2014-12-08 ENCOUNTER — Inpatient Hospital Stay (HOSPITAL_COMMUNITY): Payer: Medicaid Other

## 2014-12-08 DIAGNOSIS — O99891 Other specified diseases and conditions complicating pregnancy: Secondary | ICD-10-CM

## 2014-12-08 DIAGNOSIS — R109 Unspecified abdominal pain: Secondary | ICD-10-CM | POA: Diagnosis not present

## 2014-12-08 DIAGNOSIS — O9989 Other specified diseases and conditions complicating pregnancy, childbirth and the puerperium: Secondary | ICD-10-CM | POA: Insufficient documentation

## 2014-12-08 DIAGNOSIS — Z3A23 23 weeks gestation of pregnancy: Secondary | ICD-10-CM | POA: Diagnosis not present

## 2014-12-08 DIAGNOSIS — M549 Dorsalgia, unspecified: Secondary | ICD-10-CM

## 2014-12-08 DIAGNOSIS — O4592 Premature separation of placenta, unspecified, second trimester: Secondary | ICD-10-CM | POA: Diagnosis present

## 2014-12-08 DIAGNOSIS — O4401 Placenta previa specified as without hemorrhage, first trimester: Secondary | ICD-10-CM

## 2014-12-08 DIAGNOSIS — O26899 Other specified pregnancy related conditions, unspecified trimester: Secondary | ICD-10-CM

## 2014-12-08 LAB — URINALYSIS, ROUTINE W REFLEX MICROSCOPIC
BILIRUBIN URINE: NEGATIVE
Glucose, UA: NEGATIVE mg/dL
Ketones, ur: 15 mg/dL — AB
Nitrite: NEGATIVE
Protein, ur: NEGATIVE mg/dL
Urobilinogen, UA: 0.2 mg/dL (ref 0.0–1.0)
pH: 6 (ref 5.0–8.0)

## 2014-12-08 LAB — URINE MICROSCOPIC-ADD ON: RBC / HPF: NONE SEEN RBC/hpf (ref <3)

## 2014-12-08 LAB — WET PREP, GENITAL
Clue Cells Wet Prep HPF POC: NONE SEEN
Trich, Wet Prep: NONE SEEN

## 2014-12-08 MED ORDER — ACETAMINOPHEN-CODEINE #3 300-30 MG PO TABS: 1.0000 | ORAL_TABLET | Freq: Four times a day (QID) | ORAL | Status: DC | PRN

## 2014-12-08 MED ORDER — FLUCONAZOLE 150 MG PO TABS
150.0000 mg | ORAL_TABLET | Freq: Once | ORAL | Status: AC
  Administered 2014-12-08: 150 mg via ORAL
  Filled 2014-12-08: qty 1

## 2014-12-08 MED ORDER — FLUCONAZOLE 150 MG PO TABS: 150.0000 mg | ORAL_TABLET | Freq: Once | ORAL | Status: DC

## 2014-12-08 NOTE — MAU Note (Signed)
Pt states was admitted prior for vag bleeding, has placenta previa. Here for contractions that began last pm.

## 2014-12-08 NOTE — Progress Notes (Signed)
Written and verbal d/c instructions given and understanding voiced. 

## 2014-12-08 NOTE — MAU Note (Signed)
Sheet with back strengthening/stretching exercises given to pt

## 2014-12-08 NOTE — MAU Provider Note (Signed)
History     CSN: [id number]  Arrival date and time: 12/08/14 1632   None     Chief Complaint  Patient presents with  . Contractions   HPIpt is [id number] at [redacted]w[redacted]d pregnant with k nown anterior placenta pevia and marginal placental abruption. Pt was admitted last week for bleeding - which has subsided with small amount of dark brown discharge, but pt states she has constant back and abdominal pain. Pt has not taken anything for the pain.  Pt does have vaginal itching and white d/c  RN note:  Registered Nurse Signed  MAU Note 12/08/2014 4:46 PM    Expand All Collapse All   Pt states was admitted prior for vag bleeding, has placenta previa. Here for contractions that began last pm.       Past Medical History  Diagnosis Date  . Tuberculosis     inactive  . Blood transfusion without reported diagnosis     no infusion reaction    Past Surgical History  Procedure Laterality Date  . Dilation and curettage of uterus    . Nose surgery      Family History  Problem Relation Age of Onset  . Diabetes Mother   . Hypertension Mother     History  Substance Use Topics  . Smoking status: Never Smoker   . Smokeless tobacco: Never Used  . Alcohol Use: No    Allergies: No Known Allergies  Prescriptions prior to admission  Medication Sig Dispense Refill Last Dose  . docusate sodium (COLACE) 100 MG capsule Take 1 capsule (100 mg total) by mouth at bedtime. 30 capsule 11 Past Week at Unknown time  . Prenat-FeCbn-FeAspGl-FA-Omega (OB COMPLETE PETITE) 35-5-1-200 MG CAPS Take 1 tablet by mouth daily. 90 capsule 3 Past Week at Unknown time    ROS Physical Exam   Blood pressure 105/63, pulse 91, temperature 98.5 F (36.9 C), temperature source Oral, resp. rate 16, height 5' 2.75" (1.594 m), weight 180 lb (81.647 kg), last menstrual period 06/27/2014, not currently breastfeeding.  Physical Exam  Vitals reviewed. Constitutional: She is oriented to person, place, and time. She appears  well-developed and well-nourished. No distress.  HENT:  Head: Normocephalic.  Neck: Normal range of motion.  Cardiovascular: Normal rate.   Respiratory: Effort normal.  GI: Soft. She exhibits no distension. There is no tenderness. There is no rebound.  Not ctx palpated or noted on monitor; NST reactive  Genitourinary:  Speculum exam(per order of Dr. Clearance Coots) -Reddened mucosa with mod amount of creamy white discharge in vault; cervix closed visually  Musculoskeletal: Normal range of motion.  Neurological: She is alert and oriented to person, place, and time.  Skin: Skin is warm and dry.  Psychiatric: She has a normal mood and affect.    MAU Course  Procedures Discussed with Dr. Clearance Coots (not on call)- ordered speculum exam and ultrasound Discussed with Dr. Gaynell Face- recommended Tylenol #3 US Ob Limited  12/08/2014   OBSTETRICAL ULTRASOUND: This exam was performed within a [facility]. The OB US report was generated in the AS system, and faxed to the ordering physician.   This report is available in the [employer]. See the AS Obstetric US report via the Image Link. pt's ultrasound report is unchanged from previous on 11/28/2014- showing placenta previous and marginal placnetal abruption; normal cervical length on abdominal ultrasound Diflucan  one dose in MAU  Assessment and Plan  Abdominal pain in pregnancy Placenta previa and marginal abruption as previous Pelvic rest  and bleeding precautions F/u with dr. Clearance CootsHarper on Monday as scheduled Back pain in pregnancy- Tylenol #3 Rx Yeast vaginitis- prescription for Diflucan 150mg  #2- may take in 3 days if continues itching Daisee Centner 12/08/2014, 5:10 PM

## 2014-12-10 ENCOUNTER — Encounter: Payer: Self-pay | Admitting: Obstetrics

## 2014-12-10 ENCOUNTER — Ambulatory Visit (INDEPENDENT_AMBULATORY_CARE_PROVIDER_SITE_OTHER): Payer: Medicaid Other | Admitting: Obstetrics

## 2014-12-10 VITALS — BP 121/68 | HR 97 | Wt 184.0 lb

## 2014-12-10 DIAGNOSIS — O4402 Placenta previa specified as without hemorrhage, second trimester: Secondary | ICD-10-CM

## 2014-12-10 DIAGNOSIS — O4412 Placenta previa with hemorrhage, second trimester: Secondary | ICD-10-CM

## 2014-12-10 DIAGNOSIS — O09522 Supervision of elderly multigravida, second trimester: Secondary | ICD-10-CM

## 2014-12-10 DIAGNOSIS — Z3482 Encounter for supervision of other normal pregnancy, second trimester: Secondary | ICD-10-CM

## 2014-12-10 LAB — CULTURE, OB URINE

## 2014-12-10 LAB — POCT URINALYSIS DIPSTICK
Bilirubin, UA: NEGATIVE
Blood, UA: NEGATIVE
Glucose, UA: NEGATIVE
Ketones, UA: NEGATIVE
Leukocytes, UA: NEGATIVE
Nitrite, UA: NEGATIVE
SPEC GRAV UA: 1.02
UROBILINOGEN UA: NEGATIVE
pH, UA: 6

## 2014-12-10 NOTE — Progress Notes (Signed)
Subjective:    Angelena Genevie Cheshirebou Ennassre is a 34 y.o. female being seen today for her obstetrical visit. She is at 4154w5d gestation. Patient reports: no complaints . Fetal movement: normal.  Problem List Items Addressed This Visit    None    Visit Diagnoses    Encounter for supervision of other normal pregnancy in second trimester    -  Primary    Relevant Orders    POCT urinalysis dipstick (Completed)      Patient Active Problem List   Diagnosis Date Noted  . Placenta previa   . [redacted] weeks gestation of pregnancy   . Placenta previa with hemorrhage, antepartum 11/26/2014  . Advanced maternal age in multigravida   . [redacted] weeks gestation of pregnancy   . Unspecified vitamin D deficiency 10/27/2013   Objective:    BP 121/68 mmHg  Pulse 97  Wt 184 lb (83.462 kg)  LMP 06/27/2014 (Exact Date) FHT: 150 BPM  Uterine Size: size equals dates     Assessment:    Pregnancy @ 2254w5d     Placenta previa  Plan:    OBGCT: ordered for next visit.  Labs, problem list reviewed and updated 2 hr GTT planned Follow up in 4 weeks.

## 2014-12-13 ENCOUNTER — Ambulatory Visit (HOSPITAL_COMMUNITY)
Admit: 2014-12-13 | Discharge: 2014-12-13 | Disposition: A | Discharge status: Home / Self Care | Payer: Medicaid Other | Attending: Obstetrics | Admitting: Obstetrics

## 2014-12-13 DIAGNOSIS — O4402 Placenta previa specified as without hemorrhage, second trimester: Secondary | ICD-10-CM | POA: Diagnosis not present

## 2014-12-13 DIAGNOSIS — Z3A24 24 weeks gestation of pregnancy: Secondary | ICD-10-CM | POA: Diagnosis not present

## 2014-12-13 DIAGNOSIS — O09522 Supervision of elderly multigravida, second trimester: Secondary | ICD-10-CM

## 2014-12-13 DIAGNOSIS — Z3482 Encounter for supervision of other normal pregnancy, second trimester: Secondary | ICD-10-CM

## 2014-12-13 MED ORDER — BETAMETHASONE SOD PHOS & ACET 6 (3-3) MG/ML IJ SUSP
12.0000 mg | Freq: Once | INTRAMUSCULAR | Status: AC
  Administered 2014-12-13: 12 mg via INTRAMUSCULAR
  Filled 2014-12-13: qty 2

## 2014-12-14 ENCOUNTER — Ambulatory Visit (HOSPITAL_COMMUNITY)
Admit: 2014-12-14 | Discharge: 2014-12-14 | Disposition: A | Discharge status: Home / Self Care | Payer: Medicaid Other | Attending: Obstetrics | Admitting: Obstetrics

## 2014-12-14 DIAGNOSIS — O09522 Supervision of elderly multigravida, second trimester: Secondary | ICD-10-CM | POA: Diagnosis not present

## 2014-12-14 DIAGNOSIS — Z3A24 24 weeks gestation of pregnancy: Secondary | ICD-10-CM | POA: Diagnosis not present

## 2014-12-14 DIAGNOSIS — O4402 Placenta previa specified as without hemorrhage, second trimester: Secondary | ICD-10-CM | POA: Diagnosis present

## 2014-12-14 MED ORDER — BETAMETHASONE SOD PHOS & ACET 6 (3-3) MG/ML IJ SUSP
12.0000 mg | Freq: Once | INTRAMUSCULAR | Status: AC
  Administered 2014-12-14: 12 mg via INTRAMUSCULAR
  Filled 2014-12-14: qty 2

## 2015-01-01 ENCOUNTER — Other Ambulatory Visit (HOSPITAL_COMMUNITY): Payer: Self-pay | Admitting: Maternal and Fetal Medicine

## 2015-01-01 ENCOUNTER — Encounter (HOSPITAL_COMMUNITY): Payer: Self-pay

## 2015-01-01 ENCOUNTER — Ambulatory Visit (HOSPITAL_COMMUNITY)
Admission: RE | Admit: 2015-01-01 | Discharge: 2015-01-01 | Disposition: A | Discharge status: Home / Self Care | Payer: Medicaid Other | Source: Ambulatory Visit | Attending: Obstetrics | Admitting: Obstetrics

## 2015-01-01 ENCOUNTER — Other Ambulatory Visit: Payer: Self-pay | Admitting: Obstetrics

## 2015-01-01 DIAGNOSIS — Z3A26 26 weeks gestation of pregnancy: Secondary | ICD-10-CM | POA: Insufficient documentation

## 2015-01-01 DIAGNOSIS — O09522 Supervision of elderly multigravida, second trimester: Secondary | ICD-10-CM | POA: Diagnosis not present

## 2015-01-01 DIAGNOSIS — Z3482 Encounter for supervision of other normal pregnancy, second trimester: Secondary | ICD-10-CM

## 2015-01-01 DIAGNOSIS — O4402 Placenta previa specified as without hemorrhage, second trimester: Secondary | ICD-10-CM | POA: Diagnosis not present

## 2015-01-01 DIAGNOSIS — O09529 Supervision of elderly multigravida, unspecified trimester: Secondary | ICD-10-CM | POA: Insufficient documentation

## 2015-01-07 ENCOUNTER — Encounter: Payer: Self-pay | Admitting: Obstetrics

## 2015-01-07 ENCOUNTER — Other Ambulatory Visit: Payer: Medicaid Other

## 2015-01-07 ENCOUNTER — Ambulatory Visit (INDEPENDENT_AMBULATORY_CARE_PROVIDER_SITE_OTHER): Payer: Medicaid Other | Admitting: Obstetrics

## 2015-01-07 VITALS — BP 121/73 | HR 98 | Temp 97.3°F | Wt 182.0 lb

## 2015-01-07 DIAGNOSIS — Z3482 Encounter for supervision of other normal pregnancy, second trimester: Secondary | ICD-10-CM

## 2015-01-07 LAB — POCT URINALYSIS DIPSTICK
BILIRUBIN UA: NEGATIVE
Glucose, UA: NEGATIVE
Ketones, UA: NEGATIVE
NITRITE UA: NEGATIVE
RBC UA: NEGATIVE
SPEC GRAV UA: 1.015
Urobilinogen, UA: NEGATIVE
pH, UA: 6

## 2015-01-07 LAB — CBC
HCT: 34.4 % — ABNORMAL LOW (ref 36.0–46.0)
Hemoglobin: 11.3 g/dL — ABNORMAL LOW (ref 12.0–15.0)
MCH: 30.1 pg (ref 26.0–34.0)
MCHC: 32.8 g/dL (ref 30.0–36.0)
MCV: 91.5 fL (ref 78.0–100.0)
MPV: 11.4 fL (ref 8.6–12.4)
Platelets: 186 10*3/uL (ref 150–400)
RBC: 3.76 MIL/uL — AB (ref 3.87–5.11)
RDW: 14.2 % (ref 11.5–15.5)
WBC: 6.2 10*3/uL (ref 4.0–10.5)

## 2015-01-07 NOTE — Progress Notes (Signed)
Subjective:    Chelsea Castaneda is a 34 y.o. female being seen today for her obstetrical visit. She is at 6228w5d gestation. Patient reports: no complaints . Fetal movement: normal.  Problem List Items Addressed This Visit    None    Visit Diagnoses    Encounter for supervision of other normal pregnancy in second trimester    -  Primary    Relevant Orders    POCT urinalysis dipstick (Completed)      Patient Active Problem List   Diagnosis Date Noted  . [redacted] weeks gestation of pregnancy   . AMA (advanced maternal age) multigravida 35+   . Placenta previa antepartum in second trimester   . [redacted] weeks gestation of pregnancy   . Placenta previa   . [redacted] weeks gestation of pregnancy   . Placenta previa with hemorrhage, antepartum 11/26/2014  . Advanced maternal age in multigravida   . [redacted] weeks gestation of pregnancy   . Unspecified vitamin D deficiency 10/27/2013   Objective:    BP 121/73 mmHg  Pulse 98  Temp(Src) 97.3 F (36.3 C)  Wt 182 lb (82.555 kg)  LMP 06/27/2014 (Exact Date) FHT: 150 BPM  Uterine Size: size equals dates     Assessment:    Pregnancy @ 4728w5d    Plan:    OBGCT: discussed.  Labs, problem list reviewed and updated Follow up in 2 weeks.

## 2015-01-08 LAB — HIV ANTIBODY (ROUTINE TESTING W REFLEX): HIV 1&2 Ab, 4th Generation: NONREACTIVE

## 2015-01-08 LAB — GLUCOSE TOLERANCE, 2 HOURS W/ 1HR
Glucose, 1 hour: 107 mg/dL (ref 70–170)
Glucose, 2 hour: 86 mg/dL (ref 70–139)
Glucose, Fasting: 62 mg/dL — ABNORMAL LOW (ref 70–99)

## 2015-01-08 LAB — RPR

## 2015-01-17 DIAGNOSIS — Z3490 Encounter for supervision of normal pregnancy, unspecified, unspecified trimester: Secondary | ICD-10-CM | POA: Diagnosis not present

## 2015-01-22 ENCOUNTER — Encounter: Payer: Self-pay | Admitting: Obstetrics

## 2015-01-22 ENCOUNTER — Other Ambulatory Visit: Payer: Self-pay | Admitting: *Deleted

## 2015-01-22 ENCOUNTER — Ambulatory Visit (INDEPENDENT_AMBULATORY_CARE_PROVIDER_SITE_OTHER): Payer: Medicaid Other | Admitting: Obstetrics

## 2015-01-22 VITALS — BP 118/71 | HR 102 | Temp 98.1°F | Wt 186.0 lb

## 2015-01-22 DIAGNOSIS — Z3490 Encounter for supervision of normal pregnancy, unspecified, unspecified trimester: Secondary | ICD-10-CM

## 2015-01-22 DIAGNOSIS — O09523 Supervision of elderly multigravida, third trimester: Secondary | ICD-10-CM

## 2015-01-22 DIAGNOSIS — Z3483 Encounter for supervision of other normal pregnancy, third trimester: Secondary | ICD-10-CM

## 2015-01-22 LAB — POCT URINALYSIS DIPSTICK
Bilirubin, UA: NEGATIVE
Blood, UA: NEGATIVE
Glucose, UA: NEGATIVE
KETONES UA: NEGATIVE
Leukocytes, UA: NEGATIVE
Nitrite, UA: NEGATIVE
PH UA: 6
Spec Grav, UA: 1.02
UROBILINOGEN UA: NEGATIVE

## 2015-01-22 MED ORDER — OB COMPLETE PETITE 35-5-1-200 MG PO CAPS: 1.0000 | ORAL_CAPSULE | Freq: Every day | ORAL | Status: DC

## 2015-01-22 NOTE — Progress Notes (Signed)
Subjective:    Chelsea Castaneda is a 34 y.o. female being seen today for her obstetrical visit. She is at 6028w6d gestation. Patient reports no complaints. Fetal movement: normal.  Problem List Items Addressed This Visit    None    Visit Diagnoses    Encounter for supervision of other normal pregnancy in third trimester    -  Primary    Relevant Orders    POCT urinalysis dipstick      Patient Active Problem List   Diagnosis Date Noted  . [redacted] weeks gestation of pregnancy   . AMA (advanced maternal age) multigravida 35+   . Placenta previa antepartum in second trimester   . [redacted] weeks gestation of pregnancy   . Placenta previa   . [redacted] weeks gestation of pregnancy   . Placenta previa with hemorrhage, antepartum 11/26/2014  . Advanced maternal age in multigravida   . [redacted] weeks gestation of pregnancy   . Unspecified vitamin D deficiency 10/27/2013   Objective:    BP 118/71 mmHg  Pulse 102  Temp(Src) 98.1 F (36.7 C)  Wt 186 lb (84.369 kg)  LMP 06/27/2014 (Exact Date) FHT:  150 BPM  Uterine Size: size greater than dates  Presentation: unsure     Assessment:    Pregnancy @ 6928w6d weeks   Plan:     labs reviewed, problem list updated Consent signed. GBS sent TDAP offered  Rhogam given for RH negative Pediatrician: discussed. Infant feeding: plans to breastfeed. Maternity leave: discussed. Cigarette smoking: never smoked. Orders Placed This Encounter  Procedures  . POCT urinalysis dipstick   No orders of the defined types were placed in this encounter.   Follow up in 2 Weeks.

## 2015-02-03 ENCOUNTER — Inpatient Hospital Stay (HOSPITAL_COMMUNITY)
Admission: AD | Admit: 2015-02-03 | Discharge: 2015-02-03 | Disposition: A | Discharge status: Home / Self Care | Payer: Medicaid Other | Source: Ambulatory Visit | Attending: Obstetrics | Admitting: Obstetrics

## 2015-02-03 ENCOUNTER — Encounter (HOSPITAL_COMMUNITY): Payer: Self-pay

## 2015-02-03 DIAGNOSIS — R109 Unspecified abdominal pain: Secondary | ICD-10-CM | POA: Diagnosis present

## 2015-02-03 DIAGNOSIS — M25551 Pain in right hip: Secondary | ICD-10-CM | POA: Diagnosis not present

## 2015-02-03 DIAGNOSIS — O26893 Other specified pregnancy related conditions, third trimester: Secondary | ICD-10-CM | POA: Diagnosis not present

## 2015-02-03 DIAGNOSIS — M791 Myalgia: Secondary | ICD-10-CM | POA: Insufficient documentation

## 2015-02-03 DIAGNOSIS — M25559 Pain in unspecified hip: Secondary | ICD-10-CM | POA: Diagnosis not present

## 2015-02-03 DIAGNOSIS — Z3A31 31 weeks gestation of pregnancy: Secondary | ICD-10-CM | POA: Insufficient documentation

## 2015-02-03 DIAGNOSIS — K648 Other hemorrhoids: Secondary | ICD-10-CM | POA: Insufficient documentation

## 2015-02-03 DIAGNOSIS — O9989 Other specified diseases and conditions complicating pregnancy, childbirth and the puerperium: Secondary | ICD-10-CM | POA: Insufficient documentation

## 2015-02-03 LAB — URINE MICROSCOPIC-ADD ON

## 2015-02-03 LAB — URINALYSIS, ROUTINE W REFLEX MICROSCOPIC
Bilirubin Urine: NEGATIVE
GLUCOSE, UA: NEGATIVE mg/dL
HGB URINE DIPSTICK: NEGATIVE
KETONES UR: NEGATIVE mg/dL
Nitrite: NEGATIVE
Protein, ur: NEGATIVE mg/dL
Specific Gravity, Urine: 1.015 (ref 1.005–1.030)
Urobilinogen, UA: 0.2 mg/dL (ref 0.0–1.0)
pH: 7.5 (ref 5.0–8.0)

## 2015-02-03 NOTE — MAU Provider Note (Signed)
History     CSN: [id number]  Arrival date and time: 02/03/15 1236   First Provider Initiated Contact with Patient 02/03/15 1308      Chief Complaint  Patient presents with  . Abdominal Pain   HPI 34 y.o. [id number] at [redacted]w[redacted]d w/ right hip pain x 1 week, pain starts in right anterior hip area and radiates around hip to low back and down R leg. No abdominal pain or contraction. Also has noted bright red bleeding w/ bowel movement x 2 in the past week. Bleeding is not heavy and resolves immediately. This pregnancy is complicated by placenta previa, otherwise normal prenatal course.   Past Medical History  Diagnosis Date  . Tuberculosis     inactive  . Blood transfusion without reported diagnosis     no infusion reaction    Past Surgical History  Procedure Laterality Date  . Dilation and curettage of uterus    . Nose surgery      Family History  Problem Relation Age of Onset  . Diabetes Mother   . Hypertension Mother     History  Substance Use Topics  . Smoking status: Never Smoker   . Smokeless tobacco: Never Used  . Alcohol Use: No    Allergies: No Known Allergies  No prescriptions prior to admission    Review of Systems  Constitutional: Negative.  Negative for fever.  Respiratory: Negative.   Cardiovascular: Negative.   Gastrointestinal: Positive for blood in stool. Negative for nausea, vomiting, abdominal pain, diarrhea and constipation.  Genitourinary: Negative for dysuria, urgency, frequency, hematuria and flank pain.       Negative for vaginal bleeding, cramping/contractions  Musculoskeletal: Positive for back pain and joint pain.  Neurological: Negative.   Psychiatric/Behavioral: Negative.    Physical Exam   Blood pressure 120/78, pulse 66, temperature 97.8 F (36.6 C), temperature source Oral, resp. rate 16, last menstrual period 06/27/2014, not currently breastfeeding.  Physical Exam  Nursing note and vitals reviewed. Constitutional: She is oriented  to person, place, and time. She appears well-developed and well-nourished. No distress.  Cardiovascular: Normal rate.   Respiratory: Effort normal.  GI: Soft. She exhibits no mass. There is no tenderness. There is no rebound and no guarding.  Genitourinary: Rectal exam shows no external hemorrhoid and no fissure. There is no rash or tenderness on the right labia. There is no rash or tenderness on the left labia. No bleeding in the vagina. No vaginal discharge found.  Cervix visually closed, no digital exam d/t previa   Musculoskeletal: Normal range of motion.  Neurological: She is alert and oriented to person, place, and time.  Skin: Skin is warm and dry.  Psychiatric: She has a normal mood and affect.   Reactive tracing, very active fetus, no contractions on TOCO MAU Course  Procedures  Results for orders placed or performed during the hospital encounter of 02/03/15 (from the past 24 hour(s))  Urinalysis, Routine w reflex microscopic     Status: Abnormal   Collection Time: 02/03/15 12:50 PM  Result Value Ref Range   Color, Urine YELLOW YELLOW   APPearance CLEAR CLEAR   Specific Gravity, Urine 1.015 1.005 - 1.030   pH 7.5 5.0 - 8.0   Glucose, UA NEGATIVE NEGATIVE mg/dL   Hgb urine dipstick NEGATIVE NEGATIVE   Bilirubin Urine NEGATIVE NEGATIVE   Ketones, ur NEGATIVE NEGATIVE mg/dL   Protein, ur NEGATIVE NEGATIVE mg/dL   Urobilinogen, UA 0.2 0.0 - 1.0 mg/dL   Nitrite NEGATIVE NEGATIVE  Leukocytes, UA MODERATE (A) NEGATIVE  Urine microscopic-add on     Status: Abnormal   Collection Time: 02/03/15 12:50 PM  Result Value Ref Range   Squamous Epithelial / LPF RARE RARE   WBC, UA 3-6 <3 WBC/hpf   Bacteria, UA FEW (A) RARE     Assessment and Plan   1. Pregnancy related hip pain in third trimester, antepartum   Musculoskeletal pain - pt declines any pain medication, rev'd comfort measures and precautions.   Occasional bleeding w/ BM likely s/t internal hemorrhoid - [facility] if bleeding is ongoing or increased or with other symptoms, no bleeding at this time    Medication List    STOP taking these medications        acetaminophen-codeine 300-30 MG per tablet  Commonly known as:  TYLENOL #3     docusate sodium 100 MG capsule  Commonly known as:  COLACE     fluconazole 150 MG tablet  Commonly known as:  DIFLUCAN      TAKE these medications        OB COMPLETE PETITE 35-5-1-200 MG Caps  Take 1 tablet by mouth daily.            Follow-up Information    Follow up with HARPER,CHARLES A, MD.   Specialty:  Obstetrics and Gynecology   Why:  as scheduled or sooner as needed   Contact information:   [address] [place] [id number] [phone]         [facility] Center[facility]RAZIER,Chelsea Corriher 02/03/2015, 3:15 PM

## 2015-02-03 NOTE — MAU Note (Signed)
Pt presents to MAU with complaints of pain in the right lower side of her abdomen. Reports rectal bleeding when having a bowel movement

## 2015-02-04 LAB — CULTURE, OB URINE: Colony Count: >=100000

## 2015-02-05 ENCOUNTER — Encounter: Payer: Self-pay | Admitting: Obstetrics

## 2015-02-05 ENCOUNTER — Ambulatory Visit (INDEPENDENT_AMBULATORY_CARE_PROVIDER_SITE_OTHER): Payer: Medicaid Other | Admitting: Obstetrics

## 2015-02-05 VITALS — BP 126/76 | HR 95 | Temp 97.4°F | Wt 190.0 lb

## 2015-02-05 DIAGNOSIS — Z3483 Encounter for supervision of other normal pregnancy, third trimester: Secondary | ICD-10-CM

## 2015-02-05 DIAGNOSIS — O09523 Supervision of elderly multigravida, third trimester: Secondary | ICD-10-CM

## 2015-02-05 DIAGNOSIS — O4412 Placenta previa with hemorrhage, second trimester: Secondary | ICD-10-CM

## 2015-02-05 DIAGNOSIS — O4402 Placenta previa specified as without hemorrhage, second trimester: Secondary | ICD-10-CM

## 2015-02-05 LAB — POCT URINALYSIS DIPSTICK
Bilirubin, UA: NEGATIVE
Blood, UA: NEGATIVE
Glucose, UA: NEGATIVE
KETONES UA: NEGATIVE
LEUKOCYTES UA: NEGATIVE
Nitrite, UA: NEGATIVE
PH UA: 6
Spec Grav, UA: 1.02
UROBILINOGEN UA: NEGATIVE

## 2015-02-05 NOTE — Progress Notes (Signed)
Subjective:    Chelsea Castaneda is a 34 y.o. female being seen today for her obstetrical visit. She is at 5573w6d gestation. Patient reports no complaints. Fetal movement: normal.  Problem List Items Addressed This Visit    None    Visit Diagnoses    Encounter for supervision of other normal pregnancy in third trimester    -  Primary    Relevant Orders    POCT urinalysis dipstick (Completed)      Patient Active Problem List   Diagnosis Date Noted  . [redacted] weeks gestation of pregnancy   . AMA (advanced maternal age) multigravida 35+   . Placenta previa antepartum in second trimester   . [redacted] weeks gestation of pregnancy   . Placenta previa   . [redacted] weeks gestation of pregnancy   . Placenta previa with hemorrhage, antepartum 11/26/2014  . Advanced maternal age in multigravida   . [redacted] weeks gestation of pregnancy   . Unspecified vitamin D deficiency 10/27/2013   Objective:    BP 126/76 mmHg  Pulse 95  Temp(Src) 97.4 F (36.3 C)  Wt 190 lb (86.183 kg)  LMP 06/27/2014 (Exact Date) FHT:  150 BPM  Uterine Size: size equals dates  Presentation: unsure     Assessment:    Pregnancy @ 6373w6d weeks   Plan:     labs reviewed, problem list updated Consent signed. GBS sent TDAP offered  Rhogam given for RH negative Pediatrician: discussed. Infant feeding: plans to breastfeed. Maternity leave: discussed. Cigarette smoking: never smoked. Orders Placed This Encounter  Procedures  . POCT urinalysis dipstick   No orders of the defined types were placed in this encounter.   Follow up in 2 Weeks.

## 2015-02-15 ENCOUNTER — Telehealth: Payer: Self-pay | Admitting: Obstetrics

## 2015-02-19 ENCOUNTER — Ambulatory Visit (HOSPITAL_COMMUNITY): Payer: Medicaid Other

## 2015-02-19 ENCOUNTER — Encounter (HOSPITAL_COMMUNITY): Payer: Self-pay

## 2015-02-19 ENCOUNTER — Ambulatory Visit (HOSPITAL_COMMUNITY)
Admission: RE | Admit: 2015-02-19 | Discharge: 2015-02-19 | Disposition: A | Discharge status: Home / Self Care | Payer: Medicaid Other | Source: Ambulatory Visit | Attending: Maternal and Fetal Medicine | Admitting: Maternal and Fetal Medicine

## 2015-02-19 DIAGNOSIS — O09522 Supervision of elderly multigravida, second trimester: Secondary | ICD-10-CM

## 2015-02-19 DIAGNOSIS — Z36 Encounter for antenatal screening of mother: Secondary | ICD-10-CM | POA: Insufficient documentation

## 2015-02-19 DIAGNOSIS — O4403 Placenta previa specified as without hemorrhage, third trimester: Secondary | ICD-10-CM | POA: Insufficient documentation

## 2015-02-19 DIAGNOSIS — Z3482 Encounter for supervision of other normal pregnancy, second trimester: Secondary | ICD-10-CM

## 2015-02-19 DIAGNOSIS — Z3A33 33 weeks gestation of pregnancy: Secondary | ICD-10-CM | POA: Insufficient documentation

## 2015-02-20 ENCOUNTER — Encounter: Payer: Medicaid Other | Admitting: Obstetrics

## 2015-02-20 NOTE — Telephone Encounter (Signed)
CLOSE ENCOUNTER °

## 2015-02-22 ENCOUNTER — Encounter (HOSPITAL_COMMUNITY): Payer: Self-pay | Admitting: *Deleted

## 2015-02-22 ENCOUNTER — Inpatient Hospital Stay (HOSPITAL_COMMUNITY)
Admission: AD | Admit: 2015-02-22 | Discharge: 2015-02-23 | Disposition: A | Discharge status: Home / Self Care | Payer: Medicaid Other | Source: Ambulatory Visit | Attending: Obstetrics | Admitting: Obstetrics

## 2015-02-22 DIAGNOSIS — Z3A34 34 weeks gestation of pregnancy: Secondary | ICD-10-CM | POA: Diagnosis not present

## 2015-02-22 DIAGNOSIS — O4703 False labor before 37 completed weeks of gestation, third trimester: Secondary | ICD-10-CM

## 2015-02-22 LAB — URINALYSIS, ROUTINE W REFLEX MICROSCOPIC
BILIRUBIN URINE: NEGATIVE
Glucose, UA: NEGATIVE mg/dL
HGB URINE DIPSTICK: NEGATIVE
Ketones, ur: NEGATIVE mg/dL
LEUKOCYTES UA: NEGATIVE
NITRITE: NEGATIVE
PH: 6.5 (ref 5.0–8.0)
Protein, ur: NEGATIVE mg/dL
Specific Gravity, Urine: 1.025 (ref 1.005–1.030)
Urobilinogen, UA: 0.2 mg/dL (ref 0.0–1.0)

## 2015-02-22 MED ORDER — NIFEDIPINE 10 MG PO CAPS
20.0000 mg | ORAL_CAPSULE | Freq: Four times a day (QID) | ORAL | Status: DC
  Administered 2015-02-22: 20 mg via ORAL
  Filled 2015-02-22: qty 2

## 2015-02-22 NOTE — MAU Provider Note (Signed)
Chief Complaint:  Contractions   None     HPI: Chelsea Castaneda is a 34 y.o. [phone] at [redacted]w[redacted]d who presents to maternity admissions reporting onset of abdominal cramping this morning. The cramping is associated with shortness of breath and dizziness, both of which are new onset today.  She reports she has had 3 small glasses of flavored milk today, no other fluids.  She reports good fetal movement, denies LOF, vaginal bleeding, vaginal itching/burning, urinary symptoms, h/a, dizziness, n/v, or fever/chills.    Pregnancy Course: Low lying placenta resolved on U/S 02/19/15  Abdominal Pain This is a new problem. The current episode started today. The onset quality is gradual. The problem occurs intermittently. The most recent episode lasted 1 day. The problem has been gradually improving. The pain is located in the generalized abdominal region. The pain is moderate. The quality of the pain is cramping. The abdominal pain radiates to the back. Pertinent negatives include no constipation, diarrhea, dysuria, fever, headaches, nausea or vomiting. Associated symptoms comments: Shortness of breath, dizziness. Nothing aggravates the pain. She has tried nothing for the symptoms.    Past Medical History: Past Medical History  Diagnosis Date  . Tuberculosis     inactive  . Blood transfusion without reported diagnosis     no infusion reaction    Past obstetric history: OB History  Gravida Para Term Preterm AB SAB TAB Ectopic Multiple Living  4 3 3       3     # Outcome Date GA Lbr Len/2nd Weight Sex Delivery Anes PTL Lv  4 Current           3 Term 12/19/13 2162w0d 05:13 / 00:40 3.436 kg (7 lb 9.2 oz) F Vag-Spont EPI  Y  2 Term 02/18/11 2745w0d  2.863 kg (6 lb 5 oz) M Vag-Spont EPI N Y  1 Term 12/22/09 9745w0d  3.629 kg (8 lb) M Vag-Spont EPI N       Past Surgical History: Past Surgical History  Procedure Laterality Date  . Dilation and curettage of uterus    . Nose surgery      Family  History: Family History  Problem Relation Age of Onset  . Diabetes Mother   . Hypertension Mother     Social History: History  Substance Use Topics  . Smoking status: Never Smoker   . Smokeless tobacco: Never Used  . Alcohol Use: No    Allergies: No Known Allergies  Meds:  No prescriptions prior to admission    Review of Systems  Constitutional: Negative for fever and chills.  Respiratory: Positive for shortness of breath.   Cardiovascular: Negative for chest pain.  Gastrointestinal: Positive for abdominal pain. Negative for nausea, vomiting, diarrhea and constipation.  Genitourinary: Negative for dysuria.  Musculoskeletal: Positive for back pain.  Neurological: Positive for dizziness. Negative for headaches.    Physical Exam  Blood pressure 104/62, pulse 95, temperature 97.7 F (36.5 C), resp. rate 18, height 5\' 3"  (1.6 m), weight 85.095 kg (187 lb 9.6 oz), last menstrual period 06/27/2014, SpO2 99 %, not currently breastfeeding.   GENERAL: Well-developed, well-nourished female in no acute distress.  HEENT: normocephalic HEART: normal rate, heart sounds, regular rhythm RESP: normal effort, lung sounds clear and equal bilaterally ABDOMEN: Soft, non-tender, gravid appropriate for gestational age EXTREMITIES: Nontender, no edema NEURO: alert and oriented  Dilation: 1 Effacement (%): Thick Cervical Position: Posterior Exam by:: Sharen CounterLisa Leftwich Kirby, CNM   Cervix unchanged in 1.5 hours in MAU  FHT:  Baseline 135, moderate variability, accelerations present, no decelerations Contractions: q 2-6 mins, mild to palpation   Labs: Results for orders placed or performed during the hospital encounter of 02/22/15 (from the past 24 hour(s))  Urinalysis, Routine w reflex microscopic     Status: None   Collection Time: 02/22/15 10:20 PM  Result Value Ref Range   Color, Urine YELLOW YELLOW   APPearance CLEAR CLEAR   Specific Gravity, Urine 1.025 1.005 - 1.030   pH 6.5 5.0 -  8.0   Glucose, UA NEGATIVE NEGATIVE mg/dL   Hgb urine dipstick NEGATIVE NEGATIVE   Bilirubin Urine NEGATIVE NEGATIVE   Ketones, ur NEGATIVE NEGATIVE mg/dL   Protein, ur NEGATIVE NEGATIVE mg/dL   Urobilinogen, UA 0.2 0.0 - 1.0 mg/dL   Nitrite NEGATIVE NEGATIVE   Leukocytes, UA NEGATIVE NEGATIVE    ED Course Procardia 20 mg PO x 1 dose and PO fluids given in MAU with mild improvement in symptoms.  Assessment: 1. Threatened preterm labor, third trimester     Plan: Discharge home PTL precautions and fetal kick counts Procardia 20 mg Q 6 hours PRN      Follow-up Information    Follow up with HARPER,CHARLES A, MD.   Specialty:  Obstetrics and Gynecology   Why:  As scheduled on Monday   Contact information:   [address] [place] [address] [phone]       Follow up with THE [facility] [place] MATERNITY ADMISSIONS.   Why:  As needed for emergencies   Contact information:   [address] [id number] mc [place] [address] [phone]       Medication List    TAKE these medications        NIFEdipine 20 MG capsule  Commonly known as:  PROCARDIA  Take 1 capsule (20 mg total) by mouth every 6 (six) hours as needed.     OB COMPLETE PETITE 35-5-1-200 MG Caps  Take 1 tablet by mouth daily.        Sharen Counter Certified Nurse-Midwife 02/23/2015 2:27 AM

## 2015-02-22 NOTE — MAU Note (Addendum)
Contractions all day. Denies lof or bleeding. Some back pain and dizziness. States had low lying placenta but u/s this  Tues showed has resolved

## 2015-02-23 ENCOUNTER — Encounter (HOSPITAL_COMMUNITY): Payer: Self-pay | Admitting: *Deleted

## 2015-02-23 ENCOUNTER — Inpatient Hospital Stay (EMERGENCY_DEPARTMENT_HOSPITAL)
Admission: AD | Admit: 2015-02-23 | Discharge: 2015-02-23 | Disposition: A | Discharge status: Home / Self Care | Payer: Medicaid Other | Source: Ambulatory Visit | Attending: Obstetrics | Admitting: Obstetrics

## 2015-02-23 DIAGNOSIS — O4703 False labor before 37 completed weeks of gestation, third trimester: Secondary | ICD-10-CM | POA: Diagnosis not present

## 2015-02-23 MED ORDER — NIFEDIPINE 10 MG PO CAPS: 10.0000 mg | ORAL_CAPSULE | Freq: Four times a day (QID) | ORAL | Status: DC

## 2015-02-23 MED ORDER — NIFEDIPINE 20 MG PO CAPS: 20.0000 mg | ORAL_CAPSULE | Freq: Four times a day (QID) | ORAL | Status: DC | PRN

## 2015-02-23 NOTE — MAU Provider Note (Signed)
  History     CSN: [id number]  Arrival date and time: 02/23/15 1427   First Provider Initiated Contact with Patient 02/23/15 1514      Chief Complaint  Patient presents with  . Contractions   HPI Chelsea Castaneda 34 y.o. [id number] @[redacted]w[redacted]d  presents to MAU complaining of contractions and pressure in her back and pelvis that continued shortly after leaving her yesterday, all through the night last night and all day today.  She also complains of shortness of breath.  She had breakfast earlier today.  She has nausea, denies vomiting, vaginal bleeding, LOF, dysuria.  She endorses good fetal movement.   OB History    Gravida Para Term Preterm AB TAB SAB Ectopic Multiple Living   4 3 3       3       Past Medical History  Diagnosis Date  . Tuberculosis     inactive  . Blood transfusion without reported diagnosis     no infusion reaction    Past Surgical History  Procedure Laterality Date  . Dilation and curettage of uterus    . Nose surgery      Family History  Problem Relation Age of Onset  . Diabetes Mother   . Hypertension Mother     History  Substance Use Topics  . Smoking status: Never Smoker   . Smokeless tobacco: Never Used  . Alcohol Use: No    Allergies: No Known Allergies  Prescriptions prior to admission  Medication Sig Dispense Refill Last Dose  . NIFEdipine (PROCARDIA) 20 MG capsule Take 1 capsule (20 mg total) by mouth every 6 (six) hours as needed. 30 capsule 0 02/22/2015 at Unknown time  . Prenat-FeCbn-FeAspGl-FA-Omega (OB COMPLETE PETITE) 35-5-1-200 MG CAPS Take 1 tablet by mouth daily. 30 capsule 11 Past Week at Unknown time    ROS Pertinent ROS in HPI.  All other systems are negative.   Physical Exam   Last menstrual period 06/27/2014, not currently breastfeeding.  Physical Exam  Constitutional: She is oriented to person, place, and time. She appears well-developed and well-nourished.  HENT:  Head: Normocephalic and atraumatic.  Eyes: EOM are  normal.  Neck: Normal range of motion.  Cardiovascular: Normal rate and regular rhythm.   Respiratory: Effort normal and breath sounds normal.  GI: Soft. Bowel sounds are normal. She exhibits no distension. There is no tenderness. There is no rebound and no guarding.  Genitourinary:  Cx is 1cm  Musculoskeletal: Normal range of motion.  Neurological: She is alert and oriented to person, place, and time.  Skin: Skin is warm and dry.  Psychiatric: She has a normal mood and affect.     Fetal Tracing: Baseline:130 Variability:mod Accelerations: 15x15 Decelerations:none - quick variable? Toco: 3 ctx in 1 hour/irritability  *Pt palpated when she notes feeling contraction.  The abdomen is soft throughout.  MAU Course  Procedures  MDM Discussed with Dr. Gaynell FaceMarshall.  He advises to d/c Procardia 20mg  prn in favor of procardia 10mg  qid along with pregnancy girdle.  He is in agreement for discharge.    Assessment and Plan  A:  1. Threatened preterm labor, third trimester    P: Discharge to home Stop procardia 20mg  Begin procardia 10mg  QID Wear pregnancy girdle/maternity support band. Follow up with Dr. Clearance CootsHarper as scheduled on Monday 4/25. Patient may return to MAU as needed or if her condition were to change or worsen   Bertram Denvereague Clark, Frederik Standley E 02/23/2015, 3:14 PM

## 2015-02-23 NOTE — MAU Note (Signed)
Contractions started around around last night also feels pelvic pressure.  Denies LOF/VB.

## 2015-02-23 NOTE — Discharge Instructions (Signed)
Drink more fluids each day. If you have more than 5-6 cramps/contractions in 1 hour, drink 3 large glasses of water and lie down.  If they do not improve, call your doctor or come to MAU.  Preterm Labor Information Preterm labor is when labor starts at less than 37 weeks of pregnancy. The normal length of a pregnancy is 39 to 41 weeks. CAUSES Often, there is no identifiable underlying cause as to why a woman goes into preterm labor. One of the most common known causes of preterm labor is infection. Infections of the uterus, cervix, vagina, amniotic sac, bladder, kidney, or even the lungs (pneumonia) can cause labor to start. Other suspected causes of preterm labor include:   Urogenital infections, such as yeast infections and bacterial vaginosis.   Uterine abnormalities (uterine shape, uterine septum, fibroids, or bleeding from the placenta).   A cervix that has been operated on (it may fail to stay closed).   Malformations in the fetus.   Multiple gestations (twins, triplets, and so on).   Breakage of the amniotic sac.  RISK FACTORS  Having a previous history of preterm labor.   Having premature rupture of membranes (PROM).   Having a placenta that covers the opening of the cervix (placenta previa).   Having a placenta that separates from the uterus (placental abruption).   Having a cervix that is too weak to hold the fetus in the uterus (incompetent cervix).   Having too much fluid in the amniotic sac (polyhydramnios).   Taking illegal drugs or smoking while pregnant.   Not gaining enough weight while pregnant.   Being younger than 2518 and older than 36 years old.   Having a low socioeconomic status.   Being African American. SYMPTOMS Signs and symptoms of preterm labor include:   Menstrual-like cramps, abdominal pain, or back pain.  Uterine contractions that are regular, as frequent as six in an hour, regardless of their intensity (may be mild or  painful).  Contractions that start on the top of the uterus and spread down to the lower abdomen and back.   A sense of increased pelvic pressure.   A watery or bloody mucus discharge that comes from the vagina.  TREATMENT Depending on the length of the pregnancy and other circumstances, your health care provider may suggest bed rest. If necessary, there are medicines that can be given to stop contractions and to mature the fetal lungs. If labor happens before 34 weeks of pregnancy, a prolonged hospital stay may be recommended. Treatment depends on the condition of both you and the fetus.  WHAT SHOULD YOU DO IF YOU THINK YOU ARE IN PRETERM LABOR? Call your health care provider right away. You will need to go to the hospital to get checked immediately. HOW CAN YOU PREVENT PRETERM LABOR IN FUTURE PREGNANCIES? You should:   Stop smoking if you smoke.  Maintain healthy weight gain and avoid chemicals and drugs that are not necessary.  Be watchful for any type of infection.  Inform your health care provider if you have a known history of preterm labor. Document Released: 01/09/2004 Document Revised: 06/21/2013 Document Reviewed: 11/21/2012 [facility] Patient Information 2015 [place]. This information is not intended to replace advice given to you by your health care provider. Make sure you discuss any questions you have with your health care provider.

## 2015-02-23 NOTE — Discharge Instructions (Signed)
Pelvic Rest Pelvic rest is sometimes recommended for women when:   The placenta is partially or completely covering the opening of the cervix (placenta previa).  There is bleeding between the uterine wall and the amniotic sac in the first trimester (subchorionic hemorrhage).  The cervix begins to open without labor starting (incompetent cervix, cervical insufficiency).  The labor is too early (preterm labor). HOME CARE INSTRUCTIONS  Do not have sexual intercourse, stimulation, or an orgasm.  Do not use tampons, douche, or put anything in the vagina.  Do not lift anything over 10 pounds (4.5 kg).  Avoid strenuous activity or straining your pelvic muscles. SEEK MEDICAL CARE IF:  You have any vaginal bleeding during pregnancy. Treat this as a potential emergency.  You have cramping pain felt low in the stomach (stronger than menstrual cramps).  You notice vaginal discharge (watery, mucus, or bloody).  You have a low, dull backache.  There are regular contractions or uterine tightening. SEEK IMMEDIATE MEDICAL CARE IF: You have vaginal bleeding and have placenta previa.  Document Released: 02/13/2011 Document Revised: 01/11/2012 Document Reviewed: 02/13/2011 [facility] Patient Information 2015 [place], [place]. This information is not intended to replace advice given to you by your health care provider. Make sure you discuss any questions you have with your health care provider.  Braxton Hicks Contractions Contractions of the uterus can occur throughout pregnancy. Contractions are not always a sign that you are in labor.  WHAT ARE BRAXTON HICKS CONTRACTIONS?  Contractions that occur before labor are called Braxton Hicks contractions, or false labor. Toward the end of pregnancy (32-34 weeks), these contractions can develop more often and may become more forceful. This is not true labor because these contractions do not result in opening (dilatation) and thinning of the cervix. They are  sometimes difficult to tell apart from true labor because these contractions can be forceful and people have different pain tolerances. You should not feel embarrassed if you go to the hospital with false labor. Sometimes, the only way to tell if you are in true labor is for your health care provider to look for changes in the cervix. If there are no prenatal problems or other health problems associated with the pregnancy, it is completely safe to be sent home with false labor and await the onset of true labor. HOW CAN YOU TELL THE DIFFERENCE BETWEEN TRUE AND FALSE LABOR? False Labor  The contractions of false labor are usually shorter and not as hard as those of true labor.   The contractions are usually irregular.   The contractions are often felt in the front of the lower abdomen and in the groin.   The contractions may go away when you walk around or change positions while lying down.   The contractions get weaker and are shorter lasting as time goes on.   The contractions do not usually become progressively stronger, regular, and closer together as with true labor.  True Labor  Contractions in true labor last 30-70 seconds, become very regular, usually become more intense, and increase in frequency.   The contractions do not go away with walking.   The discomfort is usually felt in the top of the uterus and spreads to the lower abdomen and low back.   True labor can be determined by your health care provider with an exam. This will show that the cervix is dilating and getting thinner.  WHAT TO REMEMBER  Keep up with your usual exercises and follow other instructions given by your  health care provider.   °· Take medicines as directed by your health care provider.   °· Keep your regular prenatal appointments.   °· Eat and drink lightly if you think you are going into labor.   °· If Braxton Hicks contractions are making you uncomfortable:   °¨ Change your position from lying  down or resting to walking, or from walking to resting.   °¨ Sit and rest in a tub of warm water.   °¨ Drink 2-3 glasses of water. Dehydration may cause these contractions.   °¨ Do slow and deep breathing several times an hour.   °WHEN SHOULD I SEEK IMMEDIATE MEDICAL CARE? °Seek immediate medical care if: °· Your contractions become stronger, more regular, and closer together.   °· You have fluid leaking or gushing from your vagina.   °· You have a fever.   °· You pass blood-tinged mucus.   °· You have vaginal bleeding.   °· You have continuous abdominal pain.   °· You have low back pain that you never had before.   °· You feel your baby's head pushing down and causing pelvic pressure.   °· Your baby is not moving as much as it used to.   °Document Released: 10/19/2005 Document Revised: 10/24/2013 Document Reviewed: 07/31/2013 °ExitCare® Patient Information ©2015 [employer], [employer]. This information is not intended to replace advice given to you by your health care provider. Make sure you discuss any questions you have with your health care provider. ° °

## 2015-02-25 ENCOUNTER — Ambulatory Visit (INDEPENDENT_AMBULATORY_CARE_PROVIDER_SITE_OTHER): Payer: Medicaid Other | Admitting: Obstetrics

## 2015-02-25 ENCOUNTER — Encounter: Payer: Self-pay | Admitting: Obstetrics

## 2015-02-25 VITALS — BP 108/69 | HR 81 | Temp 97.2°F | Wt 188.0 lb

## 2015-02-25 DIAGNOSIS — Z3483 Encounter for supervision of other normal pregnancy, third trimester: Secondary | ICD-10-CM

## 2015-02-25 DIAGNOSIS — O09523 Supervision of elderly multigravida, third trimester: Secondary | ICD-10-CM

## 2015-02-25 LAB — POCT URINALYSIS DIPSTICK
Bilirubin, UA: NEGATIVE
Blood, UA: NEGATIVE
Glucose, UA: NEGATIVE
Ketones, UA: NEGATIVE
LEUKOCYTES UA: NEGATIVE
Nitrite, UA: NEGATIVE
Protein, UA: NEGATIVE
Spec Grav, UA: 1.015
Urobilinogen, UA: NEGATIVE
pH, UA: 7

## 2015-02-25 NOTE — Progress Notes (Signed)
Subjective:    Chelsea Castaneda is a 34 y.o. female being seen today for her obstetrical visit. She is at 5919w5d gestation. Patient reports no complaints. Fetal movement: normal.  Problem List Items Addressed This Visit    None    Visit Diagnoses    Encounter for supervision of other normal pregnancy in third trimester    -  Primary    Relevant Orders    POCT urinalysis dipstick (Completed)      Patient Active Problem List   Diagnosis Date Noted  . [redacted] weeks gestation of pregnancy   . AMA (advanced maternal age) multigravida 35+   . Placenta previa antepartum in second trimester   . [redacted] weeks gestation of pregnancy   . Placenta previa   . [redacted] weeks gestation of pregnancy   . Placenta previa with hemorrhage, antepartum 11/26/2014  . Advanced maternal age in multigravida   . [redacted] weeks gestation of pregnancy   . Unspecified vitamin D deficiency 10/27/2013   Objective:    BP 108/69 mmHg  Pulse 81  Temp(Src) 97.2 F (36.2 C)  Wt 188 lb (85.276 kg)  LMP 06/27/2014 (Exact Date) FHT:  150 BPM  Uterine Size: size equals dates  Presentation: cephalic     Assessment:    Pregnancy @ 8619w5d weeks   Plan:     labs reviewed, problem list updated Consent signed. GBS sent TDAP offered  Rhogam given for RH negative Pediatrician: discussed. Infant feeding: plans to breastfeed. Maternity leave: discussed. Cigarette smoking: never smoked. Orders Placed This Encounter  Procedures  . POCT urinalysis dipstick   No orders of the defined types were placed in this encounter.   Follow up in 1 Week.

## 2015-03-05 ENCOUNTER — Ambulatory Visit (INDEPENDENT_AMBULATORY_CARE_PROVIDER_SITE_OTHER): Payer: Medicaid Other | Admitting: Certified Nurse Midwife

## 2015-03-05 VITALS — BP 117/71 | HR 104 | Temp 98.1°F | Wt 192.8 lb

## 2015-03-05 DIAGNOSIS — Z3483 Encounter for supervision of other normal pregnancy, third trimester: Secondary | ICD-10-CM

## 2015-03-05 LAB — POCT URINALYSIS DIPSTICK
Bilirubin, UA: NEGATIVE
Blood, UA: NEGATIVE
GLUCOSE UA: 50
Ketones, UA: NEGATIVE
NITRITE UA: NEGATIVE
Spec Grav, UA: 1.015
Urobilinogen, UA: NEGATIVE
pH, UA: 7

## 2015-03-06 NOTE — Progress Notes (Signed)
Subjective:    Keshawn Genevie Cheshirebou Ennassre is a 34 y.o. female being seen today for her obstetrical visit. She is at 240w0d gestation. Patient reports no complaints. Fetal movement: normal.  Problem List Items Addressed This Visit    None    Visit Diagnoses    Encounter for supervision of other normal pregnancy in third trimester    -  Primary    Relevant Orders    Strep B DNA probe    POCT urinalysis dipstick (Completed)      Patient Active Problem List   Diagnosis Date Noted  . [redacted] weeks gestation of pregnancy   . AMA (advanced maternal age) multigravida 35+   . Placenta previa antepartum in second trimester   . [redacted] weeks gestation of pregnancy   . Placenta previa   . [redacted] weeks gestation of pregnancy   . Placenta previa with hemorrhage, antepartum 11/26/2014  . Advanced maternal age in multigravida   . [redacted] weeks gestation of pregnancy   . Unspecified vitamin D deficiency 10/27/2013  Resolved placenta previa.    Objective:    BP 117/71 mmHg  Pulse 104  Temp(Src) 98.1 F (36.7 C)  Wt 87.454 kg (192 lb 12.8 oz)  LMP 06/27/2014 (Exact Date) FHT:  145 BPM  Uterine Size: size equals dates  Presentation: cephalic     Assessment:    Pregnancy @ 6840w0d weeks   Doing well.   Plan:     labs reviewed, problem list updated Consent signed. GBS sent Pediatrician: discussed. Infant feeding: plans to breastfeed. Maternity leave: N/A. Cigarette smoking: never smoked.  Orders Placed This Encounter  Procedures  . Strep B DNA probe  . POCT urinalysis dipstick   No orders of the defined types were placed in this encounter.   Follow up in 1 Week.

## 2015-03-07 LAB — STREP B DNA PROBE: STREP GROUP B AG: NEGATIVE

## 2015-03-12 ENCOUNTER — Ambulatory Visit (INDEPENDENT_AMBULATORY_CARE_PROVIDER_SITE_OTHER): Payer: Medicaid Other | Admitting: Obstetrics

## 2015-03-12 ENCOUNTER — Encounter: Payer: Self-pay | Admitting: Obstetrics

## 2015-03-12 VITALS — BP 104/64 | HR 88 | Wt 189.0 lb

## 2015-03-12 DIAGNOSIS — Z3483 Encounter for supervision of other normal pregnancy, third trimester: Secondary | ICD-10-CM

## 2015-03-12 LAB — POCT URINALYSIS DIPSTICK
BILIRUBIN UA: NEGATIVE
GLUCOSE UA: NEGATIVE
Ketones, UA: NEGATIVE
NITRITE UA: NEGATIVE
RBC UA: NEGATIVE
Spec Grav, UA: 1.02
Urobilinogen, UA: NEGATIVE
pH, UA: 6

## 2015-03-12 NOTE — Progress Notes (Signed)
Subjective:    Ardel Genevie Cheshirebou Ennassre is a 34 y.o. female being seen today for her obstetrical visit. She is at 1772w6d gestation. Patient reports no complaints. Fetal movement: normal.  Problem List Items Addressed This Visit    None    Visit Diagnoses    Encounter for supervision of other normal pregnancy in third trimester    -  Primary    Relevant Orders    POCT urinalysis dipstick (Completed)      Patient Active Problem List   Diagnosis Date Noted  . [redacted] weeks gestation of pregnancy   . AMA (advanced maternal age) multigravida 35+   . Placenta previa antepartum in second trimester   . [redacted] weeks gestation of pregnancy   . Placenta previa   . [redacted] weeks gestation of pregnancy   . Placenta previa with hemorrhage, antepartum 11/26/2014  . Advanced maternal age in multigravida   . [redacted] weeks gestation of pregnancy   . Unspecified vitamin D deficiency 10/27/2013   Objective:    BP 104/64 mmHg  Pulse 88  Wt 189 lb (85.73 kg)  LMP 06/27/2014 (Exact Date) FHT:  150 BPM  Uterine Size: size equals dates  Presentation: unsure     Assessment:    Pregnancy @ 3972w6d weeks   Plan:     labs reviewed, problem list updated Consent signed. GBS sent TDAP offered  Rhogam given for RH negative Pediatrician: discussed. Infant feeding: plans to breastfeed. Maternity leave: discussed. Cigarette smoking: never smoked. Orders Placed This Encounter  Procedures  . POCT urinalysis dipstick   No orders of the defined types were placed in this encounter.   Follow up in 1 Week.

## 2015-03-19 ENCOUNTER — Encounter: Payer: Medicaid Other | Admitting: Certified Nurse Midwife

## 2015-03-19 ENCOUNTER — Ambulatory Visit (INDEPENDENT_AMBULATORY_CARE_PROVIDER_SITE_OTHER): Payer: Medicaid Other | Admitting: Obstetrics

## 2015-03-19 VITALS — BP 115/71 | HR 85 | Temp 98.4°F | Wt 189.0 lb

## 2015-03-19 DIAGNOSIS — Z3483 Encounter for supervision of other normal pregnancy, third trimester: Secondary | ICD-10-CM

## 2015-03-19 LAB — POCT URINALYSIS DIPSTICK
Bilirubin, UA: NEGATIVE
Glucose, UA: NEGATIVE
Ketones, UA: NEGATIVE
Leukocytes, UA: NEGATIVE
NITRITE UA: NEGATIVE
RBC UA: NEGATIVE
Spec Grav, UA: 1.025
Urobilinogen, UA: NEGATIVE
pH, UA: 5.5

## 2015-03-20 ENCOUNTER — Encounter: Payer: Self-pay | Admitting: Obstetrics

## 2015-03-20 NOTE — Progress Notes (Signed)
Subjective:    Chelsea Castaneda is a 34 y.o. female being seen today for her obstetrical visit. She is at 3261w0d gestation. Patient reports no complaints. Fetal movement: normal.  Problem List Items Addressed This Visit    None    Visit Diagnoses    Encounter for supervision of other normal pregnancy in third trimester    -  Primary    Relevant Orders    POCT urinalysis dipstick (Completed)      Patient Active Problem List   Diagnosis Date Noted  . [redacted] weeks gestation of pregnancy   . AMA (advanced maternal age) multigravida 35+   . Placenta previa antepartum in second trimester   . [redacted] weeks gestation of pregnancy   . Placenta previa   . [redacted] weeks gestation of pregnancy   . Placenta previa with hemorrhage, antepartum 11/26/2014  . Advanced maternal age in multigravida   . [redacted] weeks gestation of pregnancy   . Unspecified vitamin D deficiency 10/27/2013    Objective:    BP 115/71 mmHg  Pulse 85  Temp(Src) 98.4 F (36.9 C)  Wt 189 lb (85.73 kg)  LMP 06/27/2014 (Exact Date) FHT: 150 BPM  Uterine Size: size equals dates  Presentations: unsure    Assessment:    Pregnancy @ 1161w0d weeks   Plan:   Plans for delivery: Vaginal anticipated; labs reviewed; problem list updated Counseling: Consent signed. Infant feeding: plans to breastfeed. Cigarette smoking: never smoked. L&D discussion: symptoms of labor, discussed when to call, discussed what number to call, anesthetic/analgesic options reviewed and delivering clinician:  plans Physician. Postpartum supports and preparation: circumcision discussed and contraception plans discussed.  Follow up in 1 Week.

## 2015-03-24 ENCOUNTER — Inpatient Hospital Stay (HOSPITAL_COMMUNITY)
Admission: AD | Admit: 2015-03-24 | Discharge: 2015-03-24 | Disposition: A | Discharge status: Home / Self Care | Payer: Medicaid Other | Source: Ambulatory Visit | Attending: Obstetrics | Admitting: Obstetrics

## 2015-03-24 ENCOUNTER — Encounter (HOSPITAL_COMMUNITY): Payer: Self-pay

## 2015-03-24 DIAGNOSIS — Z3A38 38 weeks gestation of pregnancy: Secondary | ICD-10-CM | POA: Diagnosis not present

## 2015-03-24 LAB — URINALYSIS, ROUTINE W REFLEX MICROSCOPIC
BILIRUBIN URINE: NEGATIVE
Glucose, UA: NEGATIVE mg/dL
Hgb urine dipstick: NEGATIVE
Ketones, ur: NEGATIVE mg/dL
NITRITE: NEGATIVE
Protein, ur: NEGATIVE mg/dL
Specific Gravity, Urine: 1.02 (ref 1.005–1.030)
Urobilinogen, UA: 0.2 mg/dL (ref 0.0–1.0)
pH: 5.5 (ref 5.0–8.0)

## 2015-03-24 LAB — URINE MICROSCOPIC-ADD ON

## 2015-03-24 NOTE — MAU Note (Addendum)
Pt here for contractions that seemed to be constant since last pm. Denies bleeding or lof. Also has had increased frequency and urgency with voiding.

## 2015-03-24 NOTE — Discharge Instructions (Signed)
Third Trimester of Pregnancy The third trimester is from week 29 through week 42, months 7 through 9. The third trimester is a time when the fetus is growing rapidly. At the end of the ninth month, the fetus is about 20 inches in length and weighs 6-10 pounds.  BODY CHANGES Your body goes through many changes during pregnancy. The changes vary from woman to woman.   Your weight will continue to increase. You can expect to gain 25-35 pounds (11-16 kg) by the end of the pregnancy.  You may begin to get stretch marks on your hips, abdomen, and breasts.  You may urinate more often because the fetus is moving lower into your pelvis and pressing on your bladder.  You may develop or continue to have heartburn as a result of your pregnancy.  You may develop constipation because certain hormones are causing the muscles that push waste through your intestines to slow down.  You may develop hemorrhoids or swollen, bulging veins (varicose veins).  You may have pelvic pain because of the weight gain and pregnancy hormones relaxing your joints between the bones in your pelvis. Backaches may result from overexertion of the muscles supporting your posture.  You may have changes in your hair. These can include thickening of your hair, rapid growth, and changes in texture. Some women also have hair loss during or after pregnancy, or hair that feels dry or thin. Your hair will most likely return to normal after your baby is born.  Your breasts will continue to grow and be tender. A yellow discharge may leak from your breasts called colostrum.  Your belly button may stick out.  You may feel short of breath because of your expanding uterus.  You may notice the fetus "dropping," or moving lower in your abdomen.  You may have a bloody mucus discharge. This usually occurs a few days to a week before labor begins.  Your cervix becomes thin and soft (effaced) near your due date. WHAT TO EXPECT AT YOUR PRENATAL  EXAMS  You will have prenatal exams every 2 weeks until week 36. Then, you will have weekly prenatal exams. During a routine prenatal visit:  You will be weighed to make sure you and the fetus are growing normally.  Your blood pressure is taken.  Your abdomen will be measured to track your baby's growth.  The fetal heartbeat will be listened to.  Any test results from the previous visit will be discussed.  You may have a cervical check near your due date to see if you have effaced. At around 36 weeks, your caregiver will check your cervix. At the same time, your caregiver will also perform a test on the secretions of the vaginal tissue. This test is to determine if a type of bacteria, Group B streptococcus, is present. Your caregiver will explain this further. Your caregiver may ask you:  What your birth plan is.  How you are feeling.  If you are feeling the baby move.  If you have had any abnormal symptoms, such as leaking fluid, bleeding, severe headaches, or abdominal cramping.  If you have any questions. Other tests or screenings that may be performed during your third trimester include:  Blood tests that check for low iron levels (anemia).  Fetal testing to check the health, activity level, and growth of the fetus. Testing is done if you have certain medical conditions or if there are problems during the pregnancy. FALSE LABOR You may feel small, irregular contractions that   eventually go away. These are called Braxton Hicks contractions, or false labor. Contractions may last for hours, days, or even weeks before true labor sets in. If contractions come at regular intervals, intensify, or become painful, it is best to be seen by your caregiver.  SIGNS OF LABOR   Menstrual-like cramps.  Contractions that are 5 minutes apart or less.  Contractions that start on the top of the uterus and spread down to the lower abdomen and back.  A sense of increased pelvic pressure or back  pain.  A watery or bloody mucus discharge that comes from the vagina. If you have any of these signs before the 37th week of pregnancy, call your caregiver right away. You need to go to the hospital to get checked immediately. HOME CARE INSTRUCTIONS   Avoid all smoking, herbs, alcohol, and unprescribed drugs. These chemicals affect the formation and growth of the baby.  Follow your caregiver's instructions regarding medicine use. There are medicines that are either safe or unsafe to take during pregnancy.  Exercise only as directed by your caregiver. Experiencing uterine cramps is a good sign to stop exercising.  Continue to eat regular, healthy meals.  Wear a good support bra for breast tenderness.  Do not use hot tubs, steam rooms, or saunas.  Wear your seat belt at all times when driving.  Avoid raw meat, uncooked cheese, cat litter boxes, and soil used by cats. These carry germs that can cause birth defects in the baby.  Take your prenatal vitamins.  Try taking a stool softener (if your caregiver approves) if you develop constipation. Eat more high-fiber foods, such as fresh vegetables or fruit and whole grains. Drink plenty of fluids to keep your urine clear or pale yellow.  Take warm sitz baths to soothe any pain or discomfort caused by hemorrhoids. Use hemorrhoid cream if your caregiver approves.  If you develop varicose veins, wear support hose. Elevate your feet for 15 minutes, 3-4 times a day. Limit salt in your diet.  Avoid heavy lifting, wear low heal shoes, and practice good posture.  Rest a lot with your legs elevated if you have leg cramps or low back pain.  Visit your dentist if you have not gone during your pregnancy. Use a soft toothbrush to brush your teeth and be gentle when you floss.  A sexual relationship may be continued unless your caregiver directs you otherwise.  Do not travel far distances unless it is absolutely necessary and only with the approval  of your caregiver.  Take prenatal classes to understand, practice, and ask questions about the labor and delivery.  Make a trial run to the hospital.  Pack your hospital bag.  Prepare the baby's nursery.  Continue to go to all your prenatal visits as directed by your caregiver. SEEK MEDICAL CARE IF:  You are unsure if you are in labor or if your water has broken.  You have dizziness.  You have mild pelvic cramps, pelvic pressure, or nagging pain in your abdominal area.  You have persistent nausea, vomiting, or diarrhea.  You have a bad smelling vaginal discharge.  You have pain with urination. SEEK IMMEDIATE MEDICAL CARE IF:   You have a fever.  You are leaking fluid from your vagina.  You have spotting or bleeding from your vagina.  You have severe abdominal cramping or pain.  You have rapid weight loss or gain.  You have shortness of breath with chest pain.  You notice sudden or extreme swelling   of your face, hands, ankles, feet, or legs.  You have not felt your baby move in over an hour.  You have severe headaches that do not go away with medicine.  You have vision changes. Document Released: 10/13/2001 Document Revised: 10/24/2013 Document Reviewed: 12/20/2012 [employer] Patient Information 2015 [employer]. This information is not intended to replace advice given to you by your health care provider. Make sure you discuss any questions you have with your health care provider.  

## 2015-03-24 NOTE — Progress Notes (Signed)
Notified of pt's c/o contractions, ui only noted, cervical exam, efm tracing reactive, orders to d/c home w labor precautions.

## 2015-03-26 ENCOUNTER — Ambulatory Visit (INDEPENDENT_AMBULATORY_CARE_PROVIDER_SITE_OTHER): Payer: Medicaid Other | Admitting: Certified Nurse Midwife

## 2015-03-26 VITALS — BP 114/72 | HR 87 | Temp 98.3°F | Wt 189.0 lb

## 2015-03-26 DIAGNOSIS — Z3483 Encounter for supervision of other normal pregnancy, third trimester: Secondary | ICD-10-CM

## 2015-03-26 LAB — POCT URINALYSIS DIPSTICK
BILIRUBIN UA: NEGATIVE
Glucose, UA: NEGATIVE
KETONES UA: NEGATIVE
NITRITE UA: NEGATIVE
RBC UA: NEGATIVE
Spec Grav, UA: 1.015
Urobilinogen, UA: NEGATIVE
pH, UA: 6

## 2015-03-26 NOTE — Progress Notes (Signed)
Subjective:    Chelsea Castaneda is a 34 y.o. female being seen today for her obstetrical visit. She is at 2169w6d gestation. Patient reports backache, no bleeding, no leaking and occasional contractions. Fetal movement: normal.  Problem List Items Addressed This Visit    None    Visit Diagnoses    Encounter for supervision of other normal pregnancy in third trimester    -  Primary    Relevant Orders    POCT urinalysis dipstick      Patient Active Problem List   Diagnosis Date Noted  . [redacted] weeks gestation of pregnancy   . AMA (advanced maternal age) multigravida 35+   . Placenta previa antepartum in second trimester   . [redacted] weeks gestation of pregnancy   . Placenta previa   . [redacted] weeks gestation of pregnancy   . Placenta previa with hemorrhage, antepartum 11/26/2014  . Advanced maternal age in multigravida   . [redacted] weeks gestation of pregnancy   . Unspecified vitamin D deficiency 10/27/2013    Objective:    BP 114/72 mmHg  Pulse 87  Temp(Src) 98.3 F (36.8 C)  Wt 189 lb (85.73 kg)  LMP 06/27/2014 (Exact Date) FHT: 143 BPM  Uterine Size: size equals dates  Presentations: cephalic  Pelvic Exam:              Dilation: 3cm       Effacement: 50%             Station:  -3    Consistency: medium            Position: posterior     Assessment:    Pregnancy @ 3869w6d weeks   Plan:   Plans for delivery: Vaginal anticipated; labs reviewed; problem list updated Counseling: Consent signed. Infant feeding: plans to breastfeed. Cigarette smoking: never smoked. L&D discussion: symptoms of labor, discussed when to call, discussed what number to call, anesthetic/analgesic options reviewed and delivering clinician:  plans no preference. Postpartum supports and preparation: circumcision discussed and contraception plans discussed. Desires [place] PP.  Follow up in 1 Week.

## 2015-03-26 NOTE — Patient Instructions (Signed)
Third Trimester of Pregnancy The third trimester is from week 29 through week 42, months 7 through 9. This trimester is when your unborn baby (fetus) is growing very fast. At the end of the ninth month, the unborn baby is about 20 inches in length. It weighs about 6-10 pounds.  HOME CARE   Avoid all smoking, herbs, and alcohol. Avoid drugs not approved by your doctor.  Only take medicine as told by your doctor. Some medicines are safe and some are not during pregnancy.  Exercise only as told by your doctor. Stop exercising if you start having cramps.  Eat regular, healthy meals.  Wear a good support bra if your breasts are tender.  Do not use hot tubs, steam rooms, or saunas.  Wear your seat belt when driving.  Avoid raw meat, uncooked cheese, and liter boxes and soil used by cats.  Take your prenatal vitamins.  Try taking medicine that helps you poop (stool softener) as needed, and if your doctor approves. Eat more fiber by eating fresh fruit, vegetables, and whole grains. Drink enough fluids to keep your pee (urine) clear or pale yellow.  Take warm water baths (sitz baths) to soothe pain or discomfort caused by hemorrhoids. Use hemorrhoid cream if your doctor approves.  If you have puffy, bulging veins (varicose veins), wear support hose. Raise (elevate) your feet for 15 minutes, 3-4 times a day. Limit salt in your diet.  Avoid heavy lifting, wear low heels, and sit up straight.  Rest with your legs raised if you have leg cramps or low back pain.  Visit your dentist if you have not gone during your pregnancy. Use a soft toothbrush to brush your teeth. Be gentle when you floss.  You can have sex (intercourse) unless your doctor tells you not to.  Do not travel far distances unless you must. Only do so with your doctor's approval.  Take prenatal classes.  Practice driving to the hospital.  Pack your hospital bag.  Prepare the baby's room.  Go to your doctor visits. GET  HELP IF:  You are not sure if you are in labor or if your water has broken.  You are dizzy.  You have mild cramps or pressure in your lower belly (abdominal).  You have a nagging pain in your belly area.  You continue to feel sick to your stomach (nauseous), throw up (vomit), or have watery poop (diarrhea).  You have bad smelling fluid coming from your vagina.  You have pain with peeing (urination). GET HELP RIGHT AWAY IF:   You have a fever.  You are leaking fluid from your vagina.  You are spotting or bleeding from your vagina.  You have severe belly cramping or pain.  You lose or gain weight rapidly.  You have trouble catching your breath and have chest pain.  You notice sudden or extreme puffiness (swelling) of your face, hands, ankles, feet, or legs.  You have not felt the baby move in over an hour.  You have severe headaches that do not go away with medicine.  You have vision changes. Document Released: 01/13/2010 Document Revised: 02/13/2013 Document Reviewed: 12/20/2012 [facility] Patient Information 2015 [place], [place]. This information is not intended to replace advice given to you by your health care provider. Make sure you discuss any questions you have with your health care provider. Kegel Exercises The goal of Kegel exercises is to isolate and exercise your pelvic floor muscles. These muscles act as a hammock that supports the  rectum, vagina, small intestine, and uterus. As the muscles weaken, the hammock sags and these organs are displaced from their normal positions. Kegel exercises can strengthen your pelvic floor muscles and help you to improve bladder and bowel control, improve sexual response, and help reduce many problems and some discomfort during pregnancy. Kegel exercises can be done anywhere and at any time. HOW TO PERFORM KEGEL EXERCISES  Locate your pelvic floor muscles. To do this, squeeze (contract) the muscles that you use when you try to stop  the flow of urine. You will feel a tightness in the vaginal area (women) and a tight lift in the rectal area (men and women).  When you begin, contract your pelvic muscles tight for 2-5 seconds, then relax them for 2-5 seconds. This is one set. Do 4-5 sets with a short pause in between.  Contract your pelvic muscles for 8-10 seconds, then relax them for 8-10 seconds. Do 4-5 sets. If you cannot contract your pelvic muscles for 8-10 seconds, try 5-7 seconds and work your way up to 8-10 seconds. Your goal is 4-5 sets of 10 contractions each day. Keep your stomach, buttocks, and legs relaxed during the exercises. Perform sets of both short and long contractions. Vary your positions. Perform these contractions 3-4 times per day. Perform sets while you are:   Lying in bed in the morning.  Standing at lunch.  Sitting in the late afternoon.  Lying in bed at night. You should do 40-50 contractions per day. Do not perform more Kegel exercises per day than recommended. Overexercising can cause muscle fatigue. Continue these exercises for for at least 15-20 weeks or as directed by your caregiver. Document Released: 10/05/2012 Document Reviewed: 10/05/2012 [facility] Patient Information 2015 [place], [place]. This information is not intended to replace advice given to you by your health care provider. Make sure you discuss any questions you have with your health care provider. Kegel Exercises The goal of Kegel exercises is to isolate and exercise your pelvic floor muscles. These muscles act as a hammock that supports the rectum, vagina, small intestine, and uterus. As the muscles weaken, the hammock sags and these organs are displaced from their normal positions. Kegel exercises can strengthen your pelvic floor muscles and help you to improve bladder and bowel control, improve sexual response, and help reduce many problems and some discomfort during pregnancy. Kegel exercises can be done anywhere and at any  time. HOW TO PERFORM KEGEL EXERCISES  Locate your pelvic floor muscles. To do this, squeeze (contract) the muscles that you use when you try to stop the flow of urine. You will feel a tightness in the vaginal area (women) and a tight lift in the rectal area (men and women).  When you begin, contract your pelvic muscles tight for 2-5 seconds, then relax them for 2-5 seconds. This is one set. Do 4-5 sets with a short pause in between.  Contract your pelvic muscles for 8-10 seconds, then relax them for 8-10 seconds. Do 4-5 sets. If you cannot contract your pelvic muscles for 8-10 seconds, try 5-7 seconds and work your way up to 8-10 seconds. Your goal is 4-5 sets of 10 contractions each day. Keep your stomach, buttocks, and legs relaxed during the exercises. Perform sets of both short and long contractions. Vary your positions. Perform these contractions 3-4 times per day. Perform sets while you are:   Lying in bed in the morning.  Standing at lunch.  Sitting in the late afternoon.  Lying in  bed at night. You should do 40-50 contractions per day. Do not perform more Kegel exercises per day than recommended. Overexercising can cause muscle fatigue. Continue these exercises for for at least 15-20 weeks or as directed by your caregiver. Document Released: 10/05/2012 Document Reviewed: 10/05/2012 [facility] Patient Information 2015 [place], [place]. This information is not intended to replace advice given to you by your health care provider. Make sure you discuss any questions you have with your health care provider. Pregnancy and Sex  Your sex life may change during pregnancy as well as after your newborn arrives. It is normal to have questions about sex during pregnancy. All women are affected differently by pregnancy hormones. You may notice an increase or decrease in your sexual drive throughout your pregnancy. Also, your partner's attitude and sexual drive may change. Share the information in this  document with your partner. Talk openly about how you feel about sex. WHEN IS IT SAFE TO HAVE SEXUAL INTERCOURSE DURING PREGNANCY?  Sexual intercourse is generally considered safe throughout a normal low-risk pregnancy. Remember:  The fetus is protected by the uterus and the fluid-filled sac that surrounds the fetus (amniotic sac).  The cervix is closed or sealed during pregnancy.  The penis does not reach or harm the fetus during sex.  Sex and orgasms are not thought to cause miscarriages or early labor.  If you use lubricants, use a water-soluble product. WHAT RISK FACTORS MAKE IT UNSAFE TO HAVE SEX WHILE PREGNANT? Some complications or risk factors may make it necessary to limit sexual activity, such as:  You have a history of miscarriage or preterm labor.  You have bleeding, discharge, fluid leakage, or contractions.  Your placenta may be partially covering or completely covering the opening to the cervix (placenta previa).  Your cervix is weak and opens easily (incompetent cervix).  Your partner has a sexually transmitted disease (STD). Avoid sex with the infected person or use a condom to prevent infection to the fetus.  You are unsure of your partner's sexual history. Avoid sex or use condoms.  You are having twins, triples, or other multiples. Your health care provider will help you determine whether sex during your pregnancy is safe or not. WHAT PRACTICES ARE UNSAFE?  If you engage in oral sex, you should avoid having your partner blow air into your vagina. This can send a dangerous air bubble into your bloodstream.  Anal sex is not recommended during pregnancy. It can spread bacteria from the rectum and aggravate hemorrhoids. Document Released: 04/08/2010 Document Revised: 08/09/2013 Document Reviewed: 05/18/2013 [facility] Patient Information 2015 [place], [place]. This information is not intended to replace advice given to you by your health care provider. Make sure you  discuss any questions you have with your health care provider.

## 2015-03-27 ENCOUNTER — Encounter (HOSPITAL_COMMUNITY): Payer: Self-pay | Admitting: *Deleted

## 2015-03-27 ENCOUNTER — Inpatient Hospital Stay (HOSPITAL_COMMUNITY)
Admission: AD | Admit: 2015-03-27 | Discharge: 2015-03-27 | Disposition: A | Discharge status: Home / Self Care | Payer: Medicaid Other | Source: Ambulatory Visit | Attending: Obstetrics | Admitting: Obstetrics

## 2015-03-27 DIAGNOSIS — O9989 Other specified diseases and conditions complicating pregnancy, childbirth and the puerperium: Secondary | ICD-10-CM | POA: Diagnosis present

## 2015-03-27 DIAGNOSIS — Z3A39 39 weeks gestation of pregnancy: Secondary | ICD-10-CM | POA: Insufficient documentation

## 2015-03-27 NOTE — Discharge Instructions (Signed)
Braxton Hicks Contractions °Contractions of the uterus can occur throughout pregnancy. Contractions are not always a sign that you are in labor.  °WHAT ARE BRAXTON HICKS CONTRACTIONS?  °Contractions that occur before labor are called Braxton Hicks contractions, or false labor. Toward the end of pregnancy (32-34 weeks), these contractions can develop more often and may become more forceful. This is not true labor because these contractions do not result in opening (dilatation) and thinning of the cervix. They are sometimes difficult to tell apart from true labor because these contractions can be forceful and people have different pain tolerances. You should not feel embarrassed if you go to the hospital with false labor. Sometimes, the only way to tell if you are in true labor is for your health care provider to look for changes in the cervix. °If there are no prenatal problems or other health problems associated with the pregnancy, it is completely safe to be sent home with false labor and await the onset of true labor. °HOW CAN YOU TELL THE DIFFERENCE BETWEEN TRUE AND FALSE LABOR? °False Labor °· The contractions of false labor are usually shorter and not as hard as those of true labor.   °· The contractions are usually irregular.   °· The contractions are often felt in the front of the lower abdomen and in the groin.   °· The contractions may go away when you walk around or change positions while lying down.   °· The contractions get weaker and are shorter lasting as time goes on.   °· The contractions do not usually become progressively stronger, regular, and closer together as with true labor.   °True Labor °· Contractions in true labor last 30-70 seconds, become very regular, usually become more intense, and increase in frequency.   °· The contractions do not go away with walking.   °· The discomfort is usually felt in the top of the uterus and spreads to the lower abdomen and low back.   °· True labor can be  determined by your health care provider with an exam. This will show that the cervix is dilating and getting thinner.   °WHAT TO REMEMBER °· Keep up with your usual exercises and follow other instructions given by your health care provider.   °· Take medicines as directed by your health care provider.   °· Keep your regular prenatal appointments.   °· Eat and drink lightly if you think you are going into labor.   °· If Braxton Hicks contractions are making you uncomfortable:   °¨ Change your position from lying down or resting to walking, or from walking to resting.   °¨ Sit and rest in a tub of warm water.   °¨ Drink 2-3 glasses of water. Dehydration may cause these contractions.   °¨ Do slow and deep breathing several times an hour.   °WHEN SHOULD I SEEK IMMEDIATE MEDICAL CARE? °Seek immediate medical care if: °· Your contractions become stronger, more regular, and closer together.   °· You have fluid leaking or gushing from your vagina.   °· You have a fever.   °· You pass blood-tinged mucus.   °· You have vaginal bleeding.   °· You have continuous abdominal pain.   °· You have low back pain that you never had before.   °· You feel your baby's head pushing down and causing pelvic pressure.   °· Your baby is not moving as much as it used to.   °Document Released: 10/19/2005 Document Revised: 10/24/2013 Document Reviewed: 07/31/2013 °Exit[employer]® Patient Information ©2015 [employer]. This information is not intended to replace advice given to you by your health care   provider. Make sure you discuss any questions you have with your health care provider. ° °

## 2015-03-27 NOTE — MAU Note (Signed)
Contractions every 5 minutes. Denies bright red vaginal bleeding.  Positive fetal movement Denies of SROM/LOF Denies any infections/complications of pregnancy  GBS negative per patient

## 2015-04-02 ENCOUNTER — Encounter (HOSPITAL_COMMUNITY): Payer: Self-pay | Admitting: *Deleted

## 2015-04-02 ENCOUNTER — Ambulatory Visit (INDEPENDENT_AMBULATORY_CARE_PROVIDER_SITE_OTHER): Payer: Medicaid Other | Admitting: Certified Nurse Midwife

## 2015-04-02 ENCOUNTER — Telehealth (HOSPITAL_COMMUNITY): Payer: Self-pay | Admitting: *Deleted

## 2015-04-02 VITALS — BP 113/71 | HR 98 | Temp 98.1°F | Wt 193.0 lb

## 2015-04-02 DIAGNOSIS — Z3483 Encounter for supervision of other normal pregnancy, third trimester: Secondary | ICD-10-CM

## 2015-04-02 NOTE — Telephone Encounter (Signed)
Preadmission screen Interpreter number [phone]

## 2015-04-02 NOTE — Progress Notes (Signed)
Subjective:    Chelsea Castaneda is a 34 y.o. female being seen today for her obstetrical visit. She is at 6514w6d gestation. Patient reports no bleeding, no contractions and no leaking. Fetal movement: normal.  Problem List Items Addressed This Visit    None     Patient Active Problem List   Diagnosis Date Noted  . [redacted] weeks gestation of pregnancy   . AMA (advanced maternal age) multigravida 35+   . Placenta previa antepartum in second trimester   . [redacted] weeks gestation of pregnancy   . Placenta previa   . [redacted] weeks gestation of pregnancy   . Placenta previa with hemorrhage, antepartum 11/26/2014  . Advanced maternal age in multigravida   . [redacted] weeks gestation of pregnancy   . Unspecified vitamin D deficiency 10/27/2013    Objective:    BP 113/71 mmHg  Pulse 98  Temp(Src) 98.1 F (36.7 C)  Wt 193 lb (87.544 kg)  LMP 06/27/2014 (Exact Date) FHT: 136 BPM  Uterine Size: size equals dates  Presentations: cephalic  Pelvic Exam:              Dilation: 2cm       Effacement: 25%             Station:  -3    Consistency: soft            Position: posterior     Assessment:    Pregnancy @ 9514w6d weeks   Plan:   Plans for delivery: Vaginal anticipated; labs reviewed; problem list updated Counseling: Consent signed. Infant feeding: plans to breastfeed. Cigarette smoking: never smoked. L&D discussion: symptoms of labor, discussed when to call, discussed what number to call, anesthetic/analgesic options reviewed and delivering clinician:  plans Certified Nurse-Midwife. Postpartum supports and preparation: circumcision discussed and contraception plans discussed.  [facility]

## 2015-04-02 NOTE — Addendum Note (Signed)
Addended by: Samantha CrimesENNEY, RACHELLE ANNE on: 04/02/2015 04:51 PM   Modules accepted: Orders

## 2015-04-03 ENCOUNTER — Inpatient Hospital Stay (HOSPITAL_COMMUNITY)
Admission: RE | Admit: 2015-04-03 | Discharge: 2015-04-07 | DRG: 766 | Disposition: A | Discharge status: Home / Self Care | Payer: Medicaid Other | Source: Ambulatory Visit | Attending: Obstetrics | Admitting: Obstetrics

## 2015-04-03 VITALS — BP 102/64 | HR 79 | Temp 97.4°F | Resp 18 | Ht 66.0 in | Wt 193.0 lb

## 2015-04-03 DIAGNOSIS — Z8611 Personal history of tuberculosis: Secondary | ICD-10-CM

## 2015-04-03 DIAGNOSIS — O09523 Supervision of elderly multigravida, third trimester: Secondary | ICD-10-CM | POA: Diagnosis not present

## 2015-04-03 DIAGNOSIS — D649 Anemia, unspecified: Secondary | ICD-10-CM | POA: Diagnosis not present

## 2015-04-03 DIAGNOSIS — O480 Post-term pregnancy: Principal | ICD-10-CM | POA: Diagnosis present

## 2015-04-03 DIAGNOSIS — O9081 Anemia of the puerperium: Secondary | ICD-10-CM | POA: Diagnosis not present

## 2015-04-03 DIAGNOSIS — Z3A40 40 weeks gestation of pregnancy: Secondary | ICD-10-CM | POA: Diagnosis present

## 2015-04-03 DIAGNOSIS — Z3490 Encounter for supervision of normal pregnancy, unspecified, unspecified trimester: Secondary | ICD-10-CM

## 2015-04-03 DIAGNOSIS — Z98891 History of uterine scar from previous surgery: Secondary | ICD-10-CM

## 2015-04-03 DIAGNOSIS — O323XX0 Maternal care for face, brow and chin presentation, not applicable or unspecified: Secondary | ICD-10-CM | POA: Diagnosis present

## 2015-04-03 LAB — RPR: RPR: NONREACTIVE

## 2015-04-03 LAB — CBC
HEMATOCRIT: 29.6 % — AB (ref 36.0–46.0)
Hemoglobin: 9.8 g/dL — ABNORMAL LOW (ref 12.0–15.0)
MCH: 27 pg (ref 26.0–34.0)
MCHC: 33.1 g/dL (ref 30.0–36.0)
MCV: 81.5 fL (ref 78.0–100.0)
Platelets: 146 10*3/uL — ABNORMAL LOW (ref 150–400)
RBC: 3.63 MIL/uL — ABNORMAL LOW (ref 3.87–5.11)
RDW: 14.6 % (ref 11.5–15.5)
WBC: 7.1 10*3/uL (ref 4.0–10.5)

## 2015-04-03 MED ORDER — ONDANSETRON HCL 4 MG/2ML IJ SOLN: 4.0000 mg | Freq: Four times a day (QID) | INTRAMUSCULAR | Status: DC | PRN

## 2015-04-03 MED ORDER — LACTATED RINGERS IV SOLN
INTRAVENOUS | Status: DC
  Administered 2015-04-03 – 2015-04-04 (×4): via INTRAVENOUS

## 2015-04-03 MED ORDER — DIPHENHYDRAMINE HCL 50 MG/ML IJ SOLN: 12.5000 mg | INTRAMUSCULAR | Status: DC | PRN

## 2015-04-03 MED ORDER — TERBUTALINE SULFATE 1 MG/ML IJ SOLN: 0.2500 mg | Freq: Once | INTRAMUSCULAR | Status: AC | PRN

## 2015-04-03 MED ORDER — OXYTOCIN 40 UNITS IN LACTATED RINGERS INFUSION - SIMPLE MED
1.0000 m[IU]/min | INTRAVENOUS | Status: DC
  Administered 2015-04-04: 1 m[IU]/min via INTRAVENOUS

## 2015-04-03 MED ORDER — FENTANYL CITRATE (PF) 100 MCG/2ML IJ SOLN
100.0000 ug | INTRAMUSCULAR | Status: DC | PRN
  Administered 2015-04-04: 100 ug via INTRAVENOUS
  Filled 2015-04-03 (×2): qty 2

## 2015-04-03 MED ORDER — OXYCODONE-ACETAMINOPHEN 5-325 MG PO TABS: 2.0000 | ORAL_TABLET | ORAL | Status: DC | PRN

## 2015-04-03 MED ORDER — OXYTOCIN BOLUS FROM INFUSION: 500.0000 mL | INTRAVENOUS | Status: DC

## 2015-04-03 MED ORDER — MISOPROSTOL 50MCG HALF TABLET
50.0000 ug | ORAL_TABLET | Freq: Four times a day (QID) | ORAL | Status: AC
  Administered 2015-04-03: 50 ug via ORAL
  Filled 2015-04-03: qty 0.5

## 2015-04-03 MED ORDER — ZOLPIDEM TARTRATE 5 MG PO TABS: 5.0000 mg | ORAL_TABLET | Freq: Every day | ORAL | Status: DC

## 2015-04-03 MED ORDER — OXYTOCIN 40 UNITS IN LACTATED RINGERS INFUSION - SIMPLE MED
1.0000 m[IU]/min | INTRAVENOUS | Status: DC
  Administered 2015-04-03: 2 m[IU]/min via INTRAVENOUS

## 2015-04-03 MED ORDER — LIDOCAINE HCL (PF) 1 % IJ SOLN: 30.0000 mL | INTRAMUSCULAR | Status: DC | PRN

## 2015-04-03 MED ORDER — OXYTOCIN 40 UNITS IN LACTATED RINGERS INFUSION - SIMPLE MED
62.5000 mL/h | INTRAVENOUS | Status: DC
  Filled 2015-04-03: qty 1000

## 2015-04-03 MED ORDER — FENTANYL 2.5 MCG/ML BUPIVACAINE 1/10 % EPIDURAL INFUSION (WH - ANES)
14.0000 mL/h | INTRAMUSCULAR | Status: DC | PRN
  Administered 2015-04-04 (×2): 14 mL/h via EPIDURAL
  Filled 2015-04-03: qty 125

## 2015-04-03 MED ORDER — PHENYLEPHRINE 40 MCG/ML (10ML) SYRINGE FOR IV PUSH (FOR BLOOD PRESSURE SUPPORT)
80.0000 ug | PREFILLED_SYRINGE | INTRAVENOUS | Status: DC | PRN
Start: 2015-04-03 — End: 2015-04-04
  Filled 2015-04-03: qty 20

## 2015-04-03 MED ORDER — EPHEDRINE 5 MG/ML INJ: 10.0000 mg | INTRAVENOUS | Status: DC | PRN

## 2015-04-03 MED ORDER — OXYCODONE-ACETAMINOPHEN 5-325 MG PO TABS: 1.0000 | ORAL_TABLET | ORAL | Status: DC | PRN

## 2015-04-03 MED ORDER — CITRIC ACID-SODIUM CITRATE 334-500 MG/5ML PO SOLN
30.0000 mL | ORAL | Status: DC | PRN
  Administered 2015-04-04: 30 mL via ORAL
  Filled 2015-04-03: qty 15

## 2015-04-03 MED ORDER — ACETAMINOPHEN 325 MG PO TABS: 650.0000 mg | ORAL_TABLET | ORAL | Status: DC | PRN

## 2015-04-03 MED ORDER — FLEET ENEMA 7-19 GM/118ML RE ENEM: 1.0000 | ENEMA | RECTAL | Status: DC | PRN

## 2015-04-03 MED ORDER — LACTATED RINGERS IV SOLN
500.0000 mL | INTRAVENOUS | Status: DC | PRN
  Administered 2015-04-04: 500 mL via INTRAVENOUS

## 2015-04-03 NOTE — Progress Notes (Signed)
Chelsea Castaneda is a 34 y.o. G4P3003 at 947w0d by ultrasound admitted for induction of labor due to Post dates. Due date 04/03/15 and Elective at term.  Subjective:   Objective: BP 110/63 mmHg  Pulse 71  Temp(Src) 98.5 F (36.9 C)  Resp 16  Ht 5\' 6"  (1.676 m)  Wt 193 lb (87.544 kg)  BMI 31.17 kg/m2  LMP 06/27/2014 (Exact Date)      FHT:  FHR: 145 bpm, variability: moderate,  accelerations:  Present,  decelerations:  Absent UC:   regular, every 4-5 minutes with Pitocin @14  SVE:   Dilation: 2.5 Effacement (%): 20 Station: -2 Exam by:: lee  Labs: Lab Results  Component Value Date   WBC 7.1 04/03/2015   HGB 9.8* 04/03/2015   HCT 29.6* 04/03/2015   MCV 81.5 04/03/2015   PLT 146* 04/03/2015    Assessment / Plan: Continue pitocin until 2000 hrs, pitocin rest overnight and start again at 0500.    Labor: Not in active labor Preeclampsia:  no signs or symptoms of toxicity Fetal Wellbeing:  Category I Pain Control:  Labor support without medications I/D:  n/a Anticipated MOD:  NSVD  Chelsea Castaneda 04/03/2015, 5:40 PM

## 2015-04-04 ENCOUNTER — Encounter (HOSPITAL_COMMUNITY)
Admission: RE | Disposition: A | Discharge status: Home / Self Care | Payer: Self-pay | Source: Ambulatory Visit | Attending: Obstetrics

## 2015-04-04 ENCOUNTER — Inpatient Hospital Stay (HOSPITAL_COMMUNITY): Payer: Medicaid Other | Admitting: Anesthesiology

## 2015-04-04 ENCOUNTER — Encounter (HOSPITAL_COMMUNITY): Payer: Self-pay

## 2015-04-04 SURGERY — Surgical Case
Anesthesia: Epidural

## 2015-04-04 MED ORDER — PHENYLEPHRINE 40 MCG/ML (10ML) SYRINGE FOR IV PUSH (FOR BLOOD PRESSURE SUPPORT): 80.0000 ug | PREFILLED_SYRINGE | INTRAVENOUS | Status: DC | PRN

## 2015-04-04 MED ORDER — SODIUM BICARBONATE 8.4 % IV SOLN
INTRAVENOUS | Status: DC | PRN
  Administered 2015-04-04: 5 mL via EPIDURAL
  Administered 2015-04-04: 3 mL via EPIDURAL

## 2015-04-04 MED ORDER — NALBUPHINE HCL 10 MG/ML IJ SOLN: 5.0000 mg | INTRAMUSCULAR | Status: DC | PRN

## 2015-04-04 MED ORDER — MORPHINE SULFATE (PF) 0.5 MG/ML IJ SOLN
INTRAMUSCULAR | Status: DC | PRN
  Administered 2015-04-04: 4 mg via EPIDURAL
  Administered 2015-04-04: 1 mg via EPIDURAL

## 2015-04-04 MED ORDER — MORPHINE SULFATE 0.5 MG/ML IJ SOLN
INTRAMUSCULAR | Status: AC
  Filled 2015-04-04: qty 10

## 2015-04-04 MED ORDER — NALBUPHINE HCL 10 MG/ML IJ SOLN: 5.0000 mg | Freq: Once | INTRAMUSCULAR | Status: AC | PRN

## 2015-04-04 MED ORDER — KETOROLAC TROMETHAMINE 30 MG/ML IJ SOLN: 30.0000 mg | Freq: Four times a day (QID) | INTRAMUSCULAR | Status: DC | PRN

## 2015-04-04 MED ORDER — KETOROLAC TROMETHAMINE 30 MG/ML IJ SOLN
30.0000 mg | Freq: Four times a day (QID) | INTRAMUSCULAR | Status: DC | PRN
  Administered 2015-04-04: 30 mg via INTRAMUSCULAR

## 2015-04-04 MED ORDER — FENTANYL CITRATE (PF) 100 MCG/2ML IJ SOLN: 25.0000 ug | INTRAMUSCULAR | Status: DC | PRN

## 2015-04-04 MED ORDER — MEPERIDINE HCL 25 MG/ML IJ SOLN: 6.2500 mg | INTRAMUSCULAR | Status: DC | PRN

## 2015-04-04 MED ORDER — ONDANSETRON HCL 4 MG/2ML IJ SOLN: 4.0000 mg | Freq: Once | INTRAMUSCULAR | Status: DC | PRN

## 2015-04-04 MED ORDER — LACTATED RINGERS IV SOLN
INTRAVENOUS | Status: DC | PRN
  Administered 2015-04-04: 22:00:00 via INTRAVENOUS

## 2015-04-04 MED ORDER — LIDOCAINE HCL (PF) 1 % IJ SOLN
INTRAMUSCULAR | Status: DC | PRN
  Administered 2015-04-04 (×2): 4 mL

## 2015-04-04 MED ORDER — OXYTOCIN 10 UNIT/ML IJ SOLN
INTRAMUSCULAR | Status: AC
  Filled 2015-04-04: qty 4

## 2015-04-04 MED ORDER — FENTANYL 2.5 MCG/ML BUPIVACAINE 1/10 % EPIDURAL INFUSION (WH - ANES): 14.0000 mL/h | INTRAMUSCULAR | Status: DC | PRN

## 2015-04-04 MED ORDER — SCOPOLAMINE 1 MG/3DAYS TD PT72
MEDICATED_PATCH | TRANSDERMAL | Status: AC
  Filled 2015-04-04: qty 1

## 2015-04-04 MED ORDER — SCOPOLAMINE 1 MG/3DAYS TD PT72
MEDICATED_PATCH | TRANSDERMAL | Status: DC | PRN
  Administered 2015-04-04: 1 via TRANSDERMAL

## 2015-04-04 MED ORDER — LACTATED RINGERS IV SOLN
INTRAVENOUS | Status: DC | PRN
  Administered 2015-04-04 (×2): via INTRAVENOUS

## 2015-04-04 MED ORDER — KETOROLAC TROMETHAMINE 30 MG/ML IJ SOLN
INTRAMUSCULAR | Status: AC
  Filled 2015-04-04: qty 1

## 2015-04-04 MED ORDER — OXYTOCIN 10 UNIT/ML IJ SOLN
40.0000 [IU] | INTRAVENOUS | Status: DC | PRN
  Administered 2015-04-04: 40 [IU] via INTRAVENOUS

## 2015-04-04 MED ORDER — BUPIVACAINE HCL (PF) 0.25 % IJ SOLN
INTRAMUSCULAR | Status: AC
  Filled 2015-04-04: qty 20

## 2015-04-04 MED ORDER — LACTATED RINGERS IV SOLN
INTRAVENOUS | Status: DC
  Administered 2015-04-04: 18:00:00 via INTRAUTERINE

## 2015-04-04 MED ORDER — ONDANSETRON HCL 4 MG/2ML IJ SOLN
INTRAMUSCULAR | Status: DC | PRN
  Administered 2015-04-04: 4 mg via INTRAVENOUS

## 2015-04-04 MED ORDER — DIPHENHYDRAMINE HCL 50 MG/ML IJ SOLN: 12.5000 mg | INTRAMUSCULAR | Status: DC | PRN

## 2015-04-04 MED ORDER — CEFAZOLIN SODIUM-DEXTROSE 2-3 GM-% IV SOLR
INTRAVENOUS | Status: DC | PRN
  Administered 2015-04-04: 2 g via INTRAVENOUS

## 2015-04-04 MED ORDER — CEFAZOLIN SODIUM-DEXTROSE 2-3 GM-% IV SOLR
INTRAVENOUS | Status: AC
  Filled 2015-04-04: qty 50

## 2015-04-04 MED ORDER — ONDANSETRON HCL 4 MG/2ML IJ SOLN
INTRAMUSCULAR | Status: AC
  Filled 2015-04-04: qty 2

## 2015-04-04 MED ORDER — PHENYLEPHRINE 40 MCG/ML (10ML) SYRINGE FOR IV PUSH (FOR BLOOD PRESSURE SUPPORT)
PREFILLED_SYRINGE | INTRAVENOUS | Status: AC
  Filled 2015-04-04: qty 10

## 2015-04-04 SURGICAL SUPPLY — 34 items
CLAMP CORD UMBIL (MISCELLANEOUS) IMPLANT
CLOTH BEACON ORANGE TIMEOUT ST (SAFETY) ×3 IMPLANT
CONTAINER PREFILL 10% NBF 15ML (MISCELLANEOUS) ×6 IMPLANT
DRAPE SHEET LG 3/4 BI-LAMINATE (DRAPES) IMPLANT
DRSG OPSITE POSTOP 4X10 (GAUZE/BANDAGES/DRESSINGS) ×3 IMPLANT
DURAPREP 26ML APPLICATOR (WOUND CARE) ×3 IMPLANT
ELECT REM PT RETURN 9FT ADLT (ELECTROSURGICAL) ×3
ELECTRODE REM PT RTRN 9FT ADLT (ELECTROSURGICAL) ×1 IMPLANT
EXTRACTOR VACUUM M CUP 4 TUBE (SUCTIONS) IMPLANT
EXTRACTOR VACUUM M CUP 4' TUBE (SUCTIONS)
GLOVE BIO SURGEON STRL SZ8 (GLOVE) ×3 IMPLANT
GOWN STRL REUS W/TWL LRG LVL3 (GOWN DISPOSABLE) ×6 IMPLANT
KIT ABG SYR 3ML LUER SLIP (SYRINGE) IMPLANT
LIQUID BAND (GAUZE/BANDAGES/DRESSINGS) ×3 IMPLANT
NEEDLE HYPO 22GX1.5 SAFETY (NEEDLE) ×3 IMPLANT
NEEDLE HYPO 25X5/8 SAFETYGLIDE (NEEDLE) ×3 IMPLANT
NS IRRIG 1000ML POUR BTL (IV SOLUTION) ×3 IMPLANT
PACK C SECTION WH (CUSTOM PROCEDURE TRAY) ×3 IMPLANT
PAD OB MATERNITY 4.3X12.25 (PERSONAL CARE ITEMS) ×3 IMPLANT
RTRCTR C-SECT PINK 25CM LRG (MISCELLANEOUS) ×3 IMPLANT
STAPLER VISISTAT 35W (STAPLE) IMPLANT
SUT GUT PLAIN 0 CT-3 TAN 27 (SUTURE) IMPLANT
SUT MNCRL 0 VIOLET CTX 36 (SUTURE) ×3 IMPLANT
SUT MNCRL AB 4-0 PS2 18 (SUTURE) IMPLANT
SUT MON AB 2-0 CT1 27 (SUTURE) ×3 IMPLANT
SUT MON AB 3-0 SH 27 (SUTURE)
SUT MON AB 3-0 SH27 (SUTURE) IMPLANT
SUT MONOCRYL 0 CTX 36 (SUTURE) ×6
SUT PLAIN 2 0 XLH (SUTURE) IMPLANT
SUT VIC AB 0 CTX 36 (SUTURE) ×4
SUT VIC AB 0 CTX36XBRD ANBCTRL (SUTURE) ×2 IMPLANT
SYR CONTROL 10ML LL (SYRINGE) ×3 IMPLANT
TOWEL OR 17X24 6PK STRL BLUE (TOWEL DISPOSABLE) ×3 IMPLANT
TRAY FOLEY CATH SILVER 14FR (SET/KITS/TRAYS/PACK) ×3 IMPLANT

## 2015-04-04 NOTE — Anesthesia Postprocedure Evaluation (Signed)
  Anesthesia Post-op Note  Patient: Chelsea Castaneda  Procedure(s) Performed: Procedure(s) (LRB): CESAREAN SECTION (N/A)  Patient Location: PACU  Anesthesia Type: Epidural  Level of Consciousness: awake and alert   Airway and Oxygen Therapy: Patient Spontanous Breathing  Post-op Pain: mild  Post-op Assessment: Post-op Vital signs reviewed, Patient's Cardiovascular Status Stable, Respiratory Function Stable, Patent Airway and No signs of Nausea or vomiting  Last Vitals:  Filed Vitals:   04/04/15 2245  BP: 97/54  Pulse: 86  Temp:   Resp: 26    Post-op Vital Signs: stable   Complications: No apparent anesthesia complications

## 2015-04-04 NOTE — Anesthesia Procedure Notes (Signed)
Epidural Patient location during procedure: OB Start time: 04/04/2015 6:44 PM  Staffing Anesthesiologist: Mal AmabileFOSTER, Tyrian Peart Performed by: anesthesiologist   Preanesthetic Checklist Completed: patient identified, site marked, surgical consent, pre-op evaluation, timeout performed, IV checked, risks and benefits discussed and monitors and equipment checked  Epidural Patient position: sitting Prep: site prepped and draped and DuraPrep Patient monitoring: continuous pulse ox and blood pressure Approach: midline Location: L3-L4 Injection technique: LOR air  Needle:  Needle type: Tuohy  Needle gauge: 17 G Needle length: 9 cm and 9 Needle insertion depth: 4 cm Catheter type: closed end flexible Catheter size: 19 Gauge Catheter at skin depth: 9 cm Test dose: negative and Other  Assessment Events: blood not aspirated, injection not painful, no injection resistance, negative IV test and no paresthesia  Additional Notes Patient identified. Risks and benefits discussed including failed block, incomplete  Pain control, post dural puncture headache, nerve damage, paralysis, blood pressure Changes, nausea, vomiting, reactions to medications-both toxic and allergic and post Partum back pain. All questions were answered. Patient expressed understanding and wished to proceed. Sterile technique was used throughout procedure. Epidural site was Dressed with sterile barrier dressing. No paresthesias, signs of intravascular injection Or signs of intrathecal spread were encountered.  Patient was more comfortable after the epidural was dosed. Please see RN's note for documentation of vital signs and FHR which are stable.

## 2015-04-04 NOTE — Progress Notes (Signed)
Dr. Clearance CootsHarper in, need for c-section discussed. Consent obtained. Prep for OR.

## 2015-04-04 NOTE — Op Note (Signed)
Cesarean Section Procedure Note   Chelsea Castaneda   04/03/2015 - 04/04/2015  Indications: Persistent variable FHR decelerations.  Face presentation.     Pre-operative Diagnosis: Fetal Intolerence to Labor.   Post-operative Diagnosis: Same   Surgeon: HARPER,CHARLES A  Assistants: Surgical Technician  Anesthesia: epidural  Procedure Details:  The patient was seen in the Holding Room. The risks, benefits, complications, treatment options, and expected outcomes were discussed with the patient. The patient concurred with the proposed plan, giving informed consent. The patient was identified as Chelsea Castaneda and the procedure verified as C-Section Delivery. A Time Out was held and the above information confirmed.  After induction of anesthesia, the patient was draped and prepped in the usual sterile manner. A transverse incision was made and carried down through the subcutaneous tissue to the fascia. The fascial incision was made and extended transversely. The fascia was separated from the underlying rectus tissue superiorly and inferiorly. The peritoneum was identified and entered. The peritoneal incision was extended longitudinally. The utero-vesical peritoneal reflection was incised transversely and the bladder flap was bluntly freed from the lower uterine segment. A low transverse uterine incision was made. Delivered from cephalic presentation was a 3544 gram living newborn female infant(s). APGAR (1 MIN): 8   APGAR (5 MINS): 9   APGAR (10 MINS):    A cord ph was not sent. The umbilical cord was clamped and cut cord. A sample was obtained for evaluation. The placenta was removed Intact and appeared normal.  The uterine incision was closed with running locked sutures of 0 Monocryl. A second imbricating layer of the same suture was placed.  Hemostasis was observed. The paracolic gutters were irrigated. The parieto peritoneum was closed in a running fashion with 2-0 Vicryl.  The fascia was  then reapproximated with running sutures of 0 Vivryl.  The skin was closed with staples.  Instrument, sponge, and needle counts were correct prior the abdominal closure and were correct at the conclusion of the case.    Findings: Normal uterus, ovaries, tubes   Estimated Blood Loss: 1000ml  Total IV Fluids: 2500mlml   Urine Output: 300CC OF clear urine  Specimens: @ORSPECIMEN @   Complications: no complications  Disposition: PACU - hemodynamically stable.  Maternal Condition: stable   Baby condition / location:  Couplet care / Skin to Skin    Signed: Surgeon(s): Brock Badharles A Harper, MD

## 2015-04-04 NOTE — Transfer of Care (Signed)
Immediate Anesthesia Transfer of Care Note  Patient: Chelsea Castaneda  Procedure(s) Performed: Procedure(s): CESAREAN SECTION (N/A)  Patient Location: PACU  Anesthesia Type:Epidural  Level of Consciousness: awake, alert , oriented and patient cooperative  Airway & Oxygen Therapy: Patient Spontanous Breathing  Post-op Assessment: Report given to RN and Post -op Vital signs reviewed and stable  Post vital signs: Reviewed and stable  Last Vitals:  Filed Vitals:   04/04/15 2215  BP:   Pulse: 78  Temp:   Resp: 16    Complications: No apparent anesthesia complications

## 2015-04-04 NOTE — Progress Notes (Signed)
Chelsea Castaneda is a 34 y.o. G4P3003 at 9359w1d by LMP admitted for induction of labor due to Elective at term.  Subjective:   Objective: BP 95/51 mmHg  Pulse 77  Temp(Src) 97.9 F (36.6 C) (Oral)  Resp 18  Ht 5\' 6"  (1.676 m)  Wt 193 lb (87.544 kg)  BMI 31.17 kg/m2  SpO2 99%  LMP 06/27/2014 (Exact Date)      FHT:  FHR: 130 bpm, variability: moderate,  accelerations:  Present,  decelerations:  Present variable UC:   irregular, every 6-8 minutes SVE:   Dilation: 6 Effacement (%): 70 Station: -1, 0 Exam by:: Felisa Bonier. Denney, CNM  Labs: Lab Results  Component Value Date   WBC 7.1 04/03/2015   HGB 9.8* 04/03/2015   HCT 29.6* 04/03/2015   MCV 81.5 04/03/2015   PLT 146* 04/03/2015    Assessment / Plan: Induction of labor.  Multiparity at term.  Face presentation with moderate to severe variable decels.  Pitocin off and amnioinfusion ongoing with no improvement.  Do not feel comfortable starting pitocin and contractions are inadequate but with persistent variables.  Will proceed with C/S for persistent variable decels and inability to augment labor.   HARPER,CHARLES A 04/04/2015, 8:56 PM

## 2015-04-04 NOTE — Progress Notes (Signed)
Chelsea Castaneda is a 34 y.o. G4P3003 at 2876w1d by ultrasound admitted for induction of labor due to Post dates. Due date 04/03/15 and Elective at term.  Subjective: Patient in increased discomfort today with pitocin and foley bulb.  Patient given 1 dose of Fentynl IVP.  Patient requested epidural.  CNM called to room to assess vaginal bleeding and clots with foley bulb in.   Objective: BP 101/39 mmHg  Pulse 81  Temp(Src) 97.9 F (36.6 C) (Oral)  Resp 18  Ht 5\' 6"  (1.676 m)  Wt 193 lb (87.544 kg)  BMI 31.17 kg/m2  SpO2 99%  LMP 06/27/2014 (Exact Date)      FHT:  FHR: 140 bpm, variability: moderate,  accelerations:  Present,  decelerations:  Present Variables noted after SROM to the 80's; Dr. Clearance CootsHarper notified.  Amnioinfusion started.  [facility] as[facility] presentation with face noted.  Dr. Clearance CootsHarper notified.  FSC not placed d/t face presentation.  Fetus continues to have variable decels.   UC:   irregular, every 6-10 minutes SVE:   Dilation: 6 Effacement (%): 60 Station: 0 Exam by:: Sherlyn HayK Forsell RN  Labs: Lab Results  Component Value Date   WBC 7.1 04/03/2015   HGB 9.8* 04/03/2015   HCT 29.6* 04/03/2015   MCV 81.5 04/03/2015   PLT 146* 04/03/2015    Assessment / Plan: Assessment:   IOL of labor day #2 Plan: Amnioinfusion/IUPC Pitocin stopped Foley bulb was removed.  Epidural placed.    Labor: Progressing slowly, pitcoin stopped d/t variables Preeclampsia:  no signs or symptoms of toxicity Fetal Wellbeing:  Category II Pain Control:  Epidural I/D:  n/a Anticipated MOD:  NSVD  Yanice Maqueda A Varnika Butz 04/04/2015, 8:02 PM

## 2015-04-04 NOTE — Anesthesia Preprocedure Evaluation (Signed)
Anesthesia Evaluation  Patient identified by MRN, date of birth, ID band Patient awake    Reviewed: Allergy & Precautions, NPO status , Patient's Chart, lab work & pertinent test results  History of Anesthesia Complications Negative for: history of anesthetic complications  Airway Mallampati: II  TM Distance: >3 FB Neck ROM: Full    Dental no notable dental hx. (+) Dental Advisory Given   Pulmonary neg pulmonary ROS,  breath sounds clear to auscultation  Pulmonary exam normal       Cardiovascular negative cardio ROS Normal cardiovascular examRhythm:Regular Rate:Normal     Neuro/Psych negative neurological ROS  negative psych ROS   GI/Hepatic negative GI ROS, Neg liver ROS,   Endo/Other  obesity  Renal/GU negative Renal ROS  negative genitourinary   Musculoskeletal negative musculoskeletal ROS (+)   Abdominal   Peds negative pediatric ROS (+)  Hematology negative hematology ROS (+)   Anesthesia Other Findings   Reproductive/Obstetrics (+) Pregnancy                             Anesthesia Physical Anesthesia Plan  ASA: II  Anesthesia Plan: Epidural   Post-op Pain Management:    Induction:   Airway Management Planned:   Additional Equipment:   Intra-op Plan:   Post-operative Plan:   Informed Consent: I have reviewed the patients History and Physical, chart, labs and discussed the procedure including the risks, benefits and alternatives for the proposed anesthesia with the patient or authorized representative who has indicated his/her understanding and acceptance.   Dental advisory given  Plan Discussed with: CRNA  Anesthesia Plan Comments:         Anesthesia Quick Evaluation  

## 2015-04-05 ENCOUNTER — Encounter (HOSPITAL_COMMUNITY): Payer: Self-pay | Admitting: Obstetrics

## 2015-04-05 DIAGNOSIS — Z98891 History of uterine scar from previous surgery: Secondary | ICD-10-CM

## 2015-04-05 LAB — CBC
HEMATOCRIT: 24.4 % — AB (ref 36.0–46.0)
HEMOGLOBIN: 7.9 g/dL — AB (ref 12.0–15.0)
MCH: 27 pg (ref 26.0–34.0)
MCHC: 32.4 g/dL (ref 30.0–36.0)
MCV: 83.3 fL (ref 78.0–100.0)
Platelets: 129 10*3/uL — ABNORMAL LOW (ref 150–400)
RBC: 2.93 MIL/uL — ABNORMAL LOW (ref 3.87–5.11)
RDW: 14.8 % (ref 11.5–15.5)
WBC: 9.3 10*3/uL (ref 4.0–10.5)

## 2015-04-05 MED ORDER — ACETAMINOPHEN 325 MG PO TABS: 650.0000 mg | ORAL_TABLET | ORAL | Status: DC | PRN

## 2015-04-05 MED ORDER — SENNOSIDES-DOCUSATE SODIUM 8.6-50 MG PO TABS
2.0000 | ORAL_TABLET | ORAL | Status: DC
  Administered 2015-04-06 (×2): 2 via ORAL
  Filled 2015-04-05 (×2): qty 2

## 2015-04-05 MED ORDER — ZOLPIDEM TARTRATE 5 MG PO TABS: 5.0000 mg | ORAL_TABLET | Freq: Every evening | ORAL | Status: DC | PRN

## 2015-04-05 MED ORDER — SIMETHICONE 80 MG PO CHEW: 80.0000 mg | CHEWABLE_TABLET | ORAL | Status: DC | PRN

## 2015-04-05 MED ORDER — MENTHOL 3 MG MT LOZG: 1.0000 | LOZENGE | OROMUCOSAL | Status: DC | PRN

## 2015-04-05 MED ORDER — LANOLIN HYDROUS EX OINT: 1.0000 "application " | TOPICAL_OINTMENT | CUTANEOUS | Status: DC | PRN

## 2015-04-05 MED ORDER — ONDANSETRON HCL 4 MG/2ML IJ SOLN
4.0000 mg | Freq: Three times a day (TID) | INTRAMUSCULAR | Status: DC | PRN
  Administered 2015-04-05: 4 mg via INTRAVENOUS
  Filled 2015-04-05: qty 2

## 2015-04-05 MED ORDER — OXYTOCIN 40 UNITS IN LACTATED RINGERS INFUSION - SIMPLE MED: 62.5000 mL/h | INTRAVENOUS | Status: AC

## 2015-04-05 MED ORDER — NALOXONE HCL 0.4 MG/ML IJ SOLN: 0.4000 mg | INTRAMUSCULAR | Status: DC | PRN

## 2015-04-05 MED ORDER — DIPHENHYDRAMINE HCL 50 MG/ML IJ SOLN
12.5000 mg | INTRAMUSCULAR | Status: DC | PRN
  Administered 2015-04-05: 12.5 mg via INTRAVENOUS
  Filled 2015-04-05: qty 1

## 2015-04-05 MED ORDER — OXYCODONE-ACETAMINOPHEN 5-325 MG PO TABS: 2.0000 | ORAL_TABLET | ORAL | Status: DC | PRN

## 2015-04-05 MED ORDER — PRENATAL MULTIVITAMIN CH
1.0000 | ORAL_TABLET | Freq: Every day | ORAL | Status: DC
  Administered 2015-04-05 – 2015-04-07 (×3): 1 via ORAL
  Filled 2015-04-05 (×3): qty 1

## 2015-04-05 MED ORDER — DIPHENHYDRAMINE HCL 25 MG PO CAPS: 25.0000 mg | ORAL_CAPSULE | Freq: Four times a day (QID) | ORAL | Status: DC | PRN

## 2015-04-05 MED ORDER — IBUPROFEN 600 MG PO TABS
600.0000 mg | ORAL_TABLET | Freq: Four times a day (QID) | ORAL | Status: DC
  Administered 2015-04-05 – 2015-04-07 (×10): 600 mg via ORAL
  Filled 2015-04-05 (×10): qty 1

## 2015-04-05 MED ORDER — SIMETHICONE 80 MG PO CHEW
80.0000 mg | CHEWABLE_TABLET | ORAL | Status: DC
  Administered 2015-04-06 (×2): 80 mg via ORAL
  Filled 2015-04-05 (×2): qty 1

## 2015-04-05 MED ORDER — WITCH HAZEL-GLYCERIN EX PADS: 1.0000 "application " | MEDICATED_PAD | CUTANEOUS | Status: DC | PRN

## 2015-04-05 MED ORDER — DEXTROSE 5 % IV SOLN
1.0000 ug/kg/h | INTRAVENOUS | Status: DC | PRN
  Filled 2015-04-05: qty 2

## 2015-04-05 MED ORDER — SODIUM CHLORIDE 0.9 % IJ SOLN: 3.0000 mL | INTRAMUSCULAR | Status: DC | PRN

## 2015-04-05 MED ORDER — DIBUCAINE 1 % RE OINT: 1.0000 "application " | TOPICAL_OINTMENT | RECTAL | Status: DC | PRN

## 2015-04-05 MED ORDER — SIMETHICONE 80 MG PO CHEW
80.0000 mg | CHEWABLE_TABLET | Freq: Three times a day (TID) | ORAL | Status: DC
  Administered 2015-04-05 – 2015-04-07 (×7): 80 mg via ORAL
  Filled 2015-04-05 (×7): qty 1

## 2015-04-05 MED ORDER — LACTATED RINGERS IV SOLN
INTRAVENOUS | Status: DC
  Administered 2015-04-05: 03:00:00 via INTRAVENOUS

## 2015-04-05 MED ORDER — OXYCODONE-ACETAMINOPHEN 5-325 MG PO TABS
1.0000 | ORAL_TABLET | ORAL | Status: DC | PRN
  Administered 2015-04-06 – 2015-04-07 (×3): 1 via ORAL
  Filled 2015-04-05 (×3): qty 1

## 2015-04-05 MED ORDER — TETANUS-DIPHTH-ACELL PERTUSSIS 5-2.5-18.5 LF-MCG/0.5 IM SUSP: 0.5000 mL | Freq: Once | INTRAMUSCULAR | Status: DC

## 2015-04-05 MED ORDER — DIPHENHYDRAMINE HCL 25 MG PO CAPS
25.0000 mg | ORAL_CAPSULE | ORAL | Status: DC | PRN
  Administered 2015-04-05: 25 mg via ORAL
  Filled 2015-04-05: qty 1

## 2015-04-05 MED ORDER — SCOPOLAMINE 1 MG/3DAYS TD PT72
1.0000 | MEDICATED_PATCH | Freq: Once | TRANSDERMAL | Status: DC
  Filled 2015-04-05: qty 1

## 2015-04-05 NOTE — H&P (Signed)
Chelsea Castaneda is a 34 y.o. female presenting for elective IOL at term. Maternal Medical History:  Fetal activity: Perceived fetal activity is normal.   Last perceived fetal movement was within the past hour.    Prenatal complications: no prenatal complications   OB History    Gravida Para Term Preterm AB TAB SAB Ectopic Multiple Living   4 3 3       3      Past Medical History  Diagnosis Date  . Tuberculosis     inactive  . Blood transfusion without reported diagnosis     no infusion reaction   Past Surgical History  Procedure Laterality Date  . Dilation and curettage of uterus    . Nose surgery     Family History: family history includes Diabetes in her mother; Hypertension in her mother. Social History:  reports that she has never smoked. She has never used smokeless tobacco. She reports that she does not drink alcohol or use illicit drugs.   Prenatal Transfer Tool  Maternal Diabetes: No Genetic Screening: Normal Maternal Ultrasounds/Referrals: Normal Fetal Ultrasounds or other Referrals:  None Maternal Substance Abuse:  No Significant Maternal Medications:  None Significant Maternal Lab Results:  None Other Comments:  None  Review of Systems  All other systems reviewed and are negative.   Dilation: 6 Effacement (%): 70 Station: -1, 0 Exam by:: R. Denney, CNM Blood pressure 113/56, pulse 91, temperature 97.8 F (36.6 C), temperature source Oral, resp. rate 18, height 5\' 6"  (1.676 m), weight 193 lb (87.544 kg), last menstrual period 06/27/2014, SpO2 97 %, not currently breastfeeding. Maternal Exam:  Abdomen: Patient reports no abdominal tenderness. Fetal presentation: vertex  Introitus: Normal vulva. Normal vagina.  Pelvis: adequate for delivery.   Cervix: Cervix evaluated by digital exam.     Physical Exam  Nursing note and vitals reviewed. Constitutional: She is oriented to person, place, and time. She appears well-developed.  HENT:  Head:  Normocephalic and atraumatic.  Eyes: Conjunctivae are normal. Pupils are equal, round, and reactive to light.  Neck: Normal range of motion.  Cardiovascular: Normal rate and regular rhythm.   Respiratory: Effort normal.  GI: Soft.  Genitourinary: Vagina normal and uterus normal.  Musculoskeletal: Normal range of motion.  Neurological: She is alert and oriented to person, place, and time.  Skin: Skin is warm and dry.  Psychiatric: She has a normal mood and affect. Her behavior is normal. Judgment and thought content normal.    Prenatal labs: ABO, Rh: --/--/O POS (06/01 0745) Antibody: POS (06/01 0745) Rubella: 1.13 (01/11 1309) RPR: Non Reactive (06/01 0745)  HBsAg: NEGATIVE (01/11 1309)  HIV: NONREACTIVE (03/07 1153)  GBS: NEGATIVE (05/03 1701)   Assessment/Plan: 40 weeks.  Elective IOL.   HARPER,CHARLES A 04/05/2015, 10:17 AM

## 2015-04-05 NOTE — Progress Notes (Signed)
UR chart review completed.  

## 2015-04-05 NOTE — Progress Notes (Signed)
Subjective: Postpartum Day #1: Cesarean Delivery Patient reports incisional pain and tolerating PO.    Objective: Vital signs in last 24 hours: Temp:  [97.4 F (36.3 C)-98.5 F (36.9 C)] 97.8 F (36.6 C) (06/03 0730) Pulse Rate:  [65-102] 91 (06/03 0730) Resp:  [15-32] 18 (06/03 0730) BP: (75-130)/(39-83) 113/56 mmHg (06/03 0730) SpO2:  [96 %-100 %] 97 % (06/03 0730)  Physical Exam:  General: alert, cooperative and no distress Lochia: appropriate Uterine Fundus: firm Incision: no significant drainage DVT Evaluation: No evidence of DVT seen on physical exam. SCD's on.    Recent Labs  04/03/15 0745 04/05/15 0540  HGB 9.8* 7.9*  HCT 29.6* 24.4*    Assessment/Plan: Status post Cesarean section. Doing well postoperatively.  Continue current care.  Chelsea Castaneda A Ivan Lacher 04/05/2015, 9:49 AM

## 2015-04-05 NOTE — Anesthesia Postprocedure Evaluation (Signed)
  Anesthesia Post-op Note  Patient: Chelsea Castaneda  Procedure(s) Performed: Procedure(s): CESAREAN SECTION (N/A)  Patient Location: Mother/Baby  Anesthesia Type:Epidural  Level of Consciousness: awake and alert   Airway and Oxygen Therapy: Patient Spontanous Breathing  Post-op Pain: mild  Post-op Assessment: Post-op Vital signs reviewed, Patient's Cardiovascular Status Stable, Respiratory Function Stable, No signs of Nausea or vomiting, Pain level controlled, No headache, No residual numbness and No residual motor weakness  Post-op Vital Signs: Reviewed  Last Vitals:  Filed Vitals:   04/05/15 0730  BP: 113/56  Pulse: 91  Temp: 36.6 C  Resp: 18    Complications: No apparent anesthesia complications

## 2015-04-05 NOTE — Lactation Note (Signed)
This note was copied from the chart of Chelsea Czarina Genevie Cheshirebou Ennassre. Lactation Consultation Note  Room full of visitors and other 3 young children. Ex BF, P4.  Breastfed one child for one year and youngest for one month, others 4 months and 9 months. Mother states she knows how to hand express and has seen colostrum. Denies questions or problems. Mother states baby latches well but falls asleep.  Suggest undressing her for feedings. Encouraged STS. Mom encouraged to feed baby 8-12 times/24 hours and with feeding cues.  Mom made aware of O/P services, breastfeeding support groups, community resources, and our phone # for post-discharge questions.     Patient Name: Chelsea Castaneda Reason for consult: Initial assessment   Maternal Data Has patient been taught Hand Expression?: Yes Does the patient have breastfeeding experience prior to this delivery?: Yes  Feeding    LATCH Score/Interventions                      Lactation Tools Discussed/Used     Consult Status Consult Status: Follow-up Date: 04/06/15 Follow-up type: In-patient    Dahlia ByesBerkelhammer, Eutimio Gharibian [facility]oschen Castaneda, 1:34 PM

## 2015-04-05 NOTE — Progress Notes (Signed)
Pt reports feeling nauseous and is dry heaving but no emesis noted.   Zofran given at 2146 and order is for Zofran q8 hrs.  MD on call, Dr. Gaynell FaceMarshall, notified at 0116 and RN given order to give Zofran IV early.  Will continue to monitor.

## 2015-04-05 NOTE — Addendum Note (Signed)
Addendum  created 04/05/15 0759 by Angela Adamana G Sonda Coppens, CRNA   Modules edited: Notes Section   Notes Section:  File: [id number]

## 2015-04-06 NOTE — Progress Notes (Signed)
Subjective: Postpartum Day 2: Cesarean Delivery Patient reports tolerating PO, + flatus and no problems voiding.    Objective: Vital signs in last 24 hours: Temp:  [97.5 F (36.4 C)-98 F (36.7 C)] 98 F (36.7 C) (06/04 0619) Pulse Rate:  [74-79] 79 (06/04 0619) Resp:  [18-20] 18 (06/04 0619) BP: (92-112)/(50-62) 92/50 mmHg (06/04 0619) SpO2:  [98 %-99 %] 99 % (06/04 [id number])  Physical Exam:  General: alert and no distress Lochia: appropriate Uterine Fundus: firm Incision: healing well DVT Evaluation: No evidence of DVT seen on physical exam.   Recent Labs  04/05/15 0540  HGB 7.9*  HCT 24.4*    Assessment/Plan: Status post Cesarean section. Doing well postoperatively.  Anemia.  Clinically stable. Continue current care.  Aurelia Gras A 04/06/2015, 8:33 AM

## 2015-04-07 LAB — TYPE AND SCREEN
ABO/RH(D): O POS
ANTIBODY SCREEN: POSITIVE
DAT, IGG: NEGATIVE
PT AG TYPE: NEGATIVE
UNIT DIVISION: 0
UNIT DIVISION: 0

## 2015-04-07 MED ORDER — OXYCODONE-ACETAMINOPHEN 5-325 MG PO TABS: 1.0000 | ORAL_TABLET | ORAL | Status: DC | PRN

## 2015-04-07 MED ORDER — FUSION PLUS PO CAPS: 1.0000 | ORAL_CAPSULE | Freq: Every day | ORAL | Status: DC

## 2015-04-07 MED ORDER — IBUPROFEN 600 MG PO TABS: 600.0000 mg | ORAL_TABLET | Freq: Four times a day (QID) | ORAL | Status: DC | PRN

## 2015-04-07 NOTE — Discharge Summary (Signed)
Obstetric Discharge Summary Reason for Admission: induction of labor Prenatal Procedures: ultrasound Intrapartum Procedures: cesarean: low cervical, transverse Postpartum Procedures: none Complications-Operative and Postpartum: none HEMOGLOBIN  Date Value Ref Range Status  04/05/2015 7.9* 12.0 - 15.0 g/dL Final  56/21/308603/27/2014 57.814.1 12.2 - 16.2 g/dL Final   HCT  Date Value Ref Range Status  04/05/2015 24.4* 36.0 - 46.0 % Final   HCT, POC  Date Value Ref Range Status  01/26/2013 43.7 37.7 - 47.9 % Final    Physical Exam:  General: alert and no distress Lochia: appropriate Uterine Fundus: firm Incision: healing well DVT Evaluation: No evidence of DVT seen on physical exam.  Discharge Diagnoses: Term Pregnancy-delivered and Anemia from intrapartum blood loss during surgery.  Stable.  Iron Rx.  Discharge Information: Date: 04/07/2015 Activity: pelvic rest Diet: routine Medications: PNV, Ibuprofen, Colace, Iron and Percocet Condition: stable Instructions: refer to practice specific booklet Discharge to: home Follow-up Information    Follow up with Jalyah Weinheimer A, MD In 2 weeks.   Specialty:  Obstetrics and Gynecology   Contact information:   [address] [place] [id number] [phone]       Newborn Data: Live born female  Birth Weight: 7 lb 13 oz (3544 g) APGAR: 8, 9  Home with mother.  Tristen Pennino A 04/07/2015, 8:28 AM

## 2015-04-07 NOTE — Lactation Note (Signed)
This note was copied from the chart of Chelsea Castaneda. Lactation Consultation Note  BF well. Denies questions or concerns.  Aware of support groups and OP services.  Patient Name: Chelsea Castaneda ZOXWR'UToday's Date: 04/07/2015     Maternal Data    Feeding Feeding Type: Breast Fed Length of feed: 25 min  LATCH Score/Interventions Latch: Grasps breast easily, tongue down, lips flanged, rhythmical sucking.  Audible Swallowing: A few with stimulation  Type of Nipple: Everted at rest and after stimulation  Comfort (Breast/Nipple): Soft / non-tender     Hold (Positioning): No assistance needed to correctly position infant at breast.  LATCH Score: 9  Lactation Tools Discussed/Used     Consult Status      Soyla DryerJoseph, Suleman Gunning 04/07/2015, 12:20 PM

## 2015-04-07 NOTE — Progress Notes (Signed)
Subjective: Postpartum Day 3: Cesarean Delivery Patient reports tolerating PO, + flatus, + BM and no problems voiding.    Objective: Vital signs in last 24 hours: Temp:  [97.4 F (36.3 C)-97.8 F (36.6 C)] 97.4 F (36.3 C) (06/05 0559) Pulse Rate:  [76-79] 79 (06/05 0559) Resp:  [18] 18 (06/05 0559) BP: (98-102)/(64-68) 102/64 mmHg (06/05 0559)  Physical Exam:  General: alert and no distress Lochia: appropriate Uterine Fundus: firm Incision: healing well DVT Evaluation: No evidence of DVT seen on physical exam.   Recent Labs  04/05/15 0540  HGB 7.9*  HCT 24.4*    Assessment/Plan: Status post Cesarean section. Doing well postoperatively.  Discharge home with standard precautions and return to clinic in 2 weeks.  HARPER,CHARLES A 04/07/2015, 8:22 AM

## 2015-04-16 ENCOUNTER — Encounter (HOSPITAL_COMMUNITY): Payer: Self-pay

## 2015-04-18 ENCOUNTER — Ambulatory Visit (INDEPENDENT_AMBULATORY_CARE_PROVIDER_SITE_OTHER): Payer: Medicaid Other | Admitting: Obstetrics

## 2015-04-18 ENCOUNTER — Encounter: Payer: Self-pay | Admitting: Obstetrics

## 2015-04-18 DIAGNOSIS — Z30014 Encounter for initial prescription of intrauterine contraceptive device: Secondary | ICD-10-CM

## 2015-04-19 ENCOUNTER — Encounter: Payer: Self-pay | Admitting: Obstetrics

## 2015-04-19 NOTE — Progress Notes (Signed)
Subjective:     Chelsea Castaneda is a 34 y.o. female who presents for a postpartum visit. She is 2 weeks postpartum following a low cervical transverse Cesarean section. I have fully reviewed the prenatal and intrapartum course. The delivery was at 40 gestational weeks. Outcome: primary cesarean section, low transverse incision. Anesthesia: epidural. Postpartum course has been normal. Baby's course has been normal. Baby is feeding by breast. Bleeding thin lochia. Bowel function is normal. Bladder function is normal. Patient is not sexually active. Contraception method is abstinence. Postpartum depression screening: negative.  Tobacco, alcohol and substance abuse history reviewed.  Adult immunizations reviewed including TDAP, rubella and varicella.  The following portions of the patient's history were reviewed and updated as appropriate: allergies, current medications, past family history, past medical history, past social history, past surgical history and problem list.  Review of Systems A comprehensive review of systems was negative.   Objective:    BP 123/73 mmHg  Pulse 82  Temp(Src) 97.3 F (36.3 C)  Ht 5\' 3"  (1.6 m)  Wt 166 lb (75.297 kg)  BMI 29.41 kg/m2  LMP 06/27/2014 (Exact Date)  Breastfeeding? Yes  General:  alert and no distress  Breasts:  Soft, nontender. Abdomen:  Soft, nontender.  Incision C, D, I.      Assessment:   2 weeks postpartum.  Doing well. Contraceptive management.  Wants ParaGuard IUD.   Plan:    1. Contraception: IUD 2. ParaGuard IUD Rx 3. Follow up in: 4 weeks or as needed. IUD insertion.  Healthy lifestyle practices reviewed

## 2015-04-25 ENCOUNTER — Telehealth: Payer: Self-pay | Admitting: *Deleted

## 2015-04-25 NOTE — Telephone Encounter (Signed)
Patient is interested in a ParaGard IUD for contraception. Per Dr. Clearance Coots okay to schedule 4 weeks from her last appointment.Per patient request appointment scheduled with Orvilla Cornwall, CNM on May 23, 2015. Patient advised to abstain from intercourse until after that appointment. Patient verbalized understanding.

## 2015-05-23 ENCOUNTER — Ambulatory Visit (INDEPENDENT_AMBULATORY_CARE_PROVIDER_SITE_OTHER): Payer: Medicaid Other | Admitting: Certified Nurse Midwife

## 2015-05-23 ENCOUNTER — Encounter: Payer: Self-pay | Admitting: Certified Nurse Midwife

## 2015-05-23 VITALS — BP 117/76 | HR 73 | Temp 97.4°F | Ht 67.0 in | Wt 168.0 lb

## 2015-05-23 DIAGNOSIS — Z0189 Encounter for other specified special examinations: Secondary | ICD-10-CM

## 2015-05-23 DIAGNOSIS — Z30430 Encounter for insertion of intrauterine contraceptive device: Secondary | ICD-10-CM

## 2015-05-23 LAB — POCT URINE PREGNANCY: PREG TEST UR: NEGATIVE

## 2015-05-23 NOTE — Addendum Note (Signed)
Addended by: Marya Landry D on: 05/23/2015 02:20 PM   Modules accepted: Orders

## 2015-05-23 NOTE — Progress Notes (Signed)
Patient ID: Chelsea Castaneda, female   DOB: October 18, 1981, 34 y.o.   MRN: [id number]  IUD Procedure Note   DIAGNOSIS: Desires long-term, reversible contraception   PROCEDURE: IUD placement Performing Provider: Orvilla Cornwall CNM  Patient counseled prior to procedure. I explained risks and benefits of Paraguard IUD, reviewed alternative forms of contraception. Patient stated understanding and consented to continue with procedure.   LMP: 2 weeks ago, not currently sexually active Pregnancy Test: Negative Lot #: [id number]  Expiration Date: April 2022   IUD type: [    ] Mirena    Paraguard    PROCEDURE:  Timeout procedure was performed to ensure right patient and right site.  A bimanual exam was performed to determine the position of the uterus, retroverted. The speculum was placed. The vagina and cervix was sterilized in the usual manner and sterile technique was maintained throughout the course of the procedure. A single toothed tenaculum was applied to the posterior lip of the cervix and gentle traction applied. The depth of the uterus was sounded to 8 cm. With gentle traction on the tenaculum, the IUD was inserted to the appropriate depth and inserted without difficulty.  The string was cut to an estimated 4 cm length. Bleeding was minimal. The patient tolerated the procedure well.   Follow up: The patient tolerated the procedure well without complications.  Standard post-procedure care is explained and return precautions are given.  Orvilla Cornwall CNM

## 2015-05-24 LAB — PAP IG AND HPV HIGH-RISK: HPV DNA High Risk: NOT DETECTED

## 2015-05-26 LAB — SURESWAB, VAGINOSIS/VAGINITIS PLUS
Atopobium vaginae: NOT DETECTED Log (cells/mL)
C. ALBICANS, DNA: NOT DETECTED
C. glabrata, DNA: NOT DETECTED
C. parapsilosis, DNA: NOT DETECTED
C. trachomatis RNA, TMA: NOT DETECTED
C. tropicalis, DNA: NOT DETECTED
Gardnerella vaginalis: NOT DETECTED Log (cells/mL)
LACTOBACILLUS SPECIES: NOT DETECTED Log (cells/mL)
MEGASPHAERA SPECIES: NOT DETECTED Log (cells/mL)
N. gonorrhoeae RNA, TMA: NOT DETECTED
T. vaginalis RNA, QL TMA: NOT DETECTED

## 2015-06-25 ENCOUNTER — Ambulatory Visit (INDEPENDENT_AMBULATORY_CARE_PROVIDER_SITE_OTHER): Payer: Medicaid Other | Admitting: Certified Nurse Midwife

## 2015-06-25 ENCOUNTER — Encounter: Payer: Self-pay | Admitting: Certified Nurse Midwife

## 2015-06-25 VITALS — BP 114/71 | HR 64 | Temp 97.9°F | Wt 169.3 lb

## 2015-06-25 DIAGNOSIS — R52 Pain, unspecified: Secondary | ICD-10-CM

## 2015-06-25 DIAGNOSIS — Z9189 Other specified personal risk factors, not elsewhere classified: Secondary | ICD-10-CM | POA: Diagnosis not present

## 2015-06-25 MED ORDER — DIPHENHYDRAMINE HCL 25 MG PO TABS: 25.0000 mg | ORAL_TABLET | Freq: Four times a day (QID) | ORAL | Status: DC | PRN

## 2015-06-25 MED ORDER — IBUPROFEN 800 MG PO TABS: 800.0000 mg | ORAL_TABLET | Freq: Three times a day (TID) | ORAL | Status: DC | PRN

## 2015-06-25 NOTE — Progress Notes (Signed)
Patient ID: Chelsea Castaneda, female   DOB: 01-23-1981, 34 y.o.   MRN: [id number]   Chief Complaint  Patient presents with  . Follow-up    paraguard     HPI Chelsea Castaneda is a 34 y.o. female.  Here for f/u on Paraguard IUD.  States that her last period was a few days of brown spotting.  So far is very pleased with the IUD.  Discussed side effects.  Currently has stopped breastfeeding with one breast engorged on the right side.  Was not able to produce milk on the left side, so has been exclusively breastfeeding from the right breast, but is not producing enough breast milk for her daughter and decided to stop breastfeeding a few days ago.  Discussed ways to compress the breast to stop producing milk.    HPI  Past Medical History  Diagnosis Date  . Tuberculosis     inactive  . Blood transfusion without reported diagnosis     no infusion reaction    Past Surgical History  Procedure Laterality Date  . Dilation and curettage of uterus    . Nose surgery    . Cesarean section N/A 04/04/2015    Procedure: CESAREAN SECTION;  Surgeon: Brock Bad, MD;  Location: [facility];  Service: Obstetrics;  Laterality: N/A;    Family History  Problem Relation Age of Onset  . Diabetes Mother   . Hypertension Mother     Social History Social History  Substance Use Topics  . Smoking status: Never Smoker   . Smokeless tobacco: Never Used  . Alcohol Use: No    No Known Allergies  Current Outpatient Prescriptions  Medication Sig Dispense Refill  . Prenat-FeCbn-FeAspGl-FA-Omega (OB COMPLETE PETITE) 35-5-1-200 MG CAPS Take 1 tablet by mouth daily. 30 capsule 11  . carbamide peroxide (DEBROX) 6.5 % otic solution Place 10 drops into the right ear 2 (two) times daily.    Marland Kitchen ibuprofen (ADVIL,MOTRIN) 600 MG tablet Take 1 tablet (600 mg total) by mouth every 6 (six) hours as needed for mild pain. (Patient not taking: Reported on 04/18/2015) 30 tablet 0  . Iron-FA-B Cmp-C-Biot-Probiotic (FUSION  PLUS) CAPS Take 1 capsule by mouth daily before breakfast. (Patient not taking: Reported on 04/18/2015) 30 capsule 5  . oxyCODONE-acetaminophen (PERCOCET/ROXICET) 5-325 MG per tablet Take 1-2 tablets by mouth every 4 (four) hours as needed for moderate pain or severe pain (for pain scale greater than 7). (Patient not taking: Reported on 04/18/2015) 40 tablet 0  . [DISCONTINUED] gabapentin (NEURONTIN) 100 MG capsule Take 1 capsule (100 mg total) by mouth 3 (three) times daily. 30 capsule 0   No current facility-administered medications for this visit.    Review of Systems Review of Systems Constitutional: negative for fatigue and weight loss Respiratory: negative for cough and wheezing Cardiovascular: negative for chest pain, fatigue and palpitations Gastrointestinal: negative for abdominal pain and change in bowel habits Genitourinary:negative Integument/breast: negative for nipple discharge Musculoskeletal:negative for myalgias Neurological: negative for gait problems and tremors Behavioral/Psych: negative for abusive relationship, depression Endocrine: negative for temperature intolerance     Blood pressure 114/71, pulse 64, temperature 97.9 F (36.6 C), weight 169 lb 4.8 oz (76.794 kg), not currently breastfeeding.  Physical Exam Physical Exam General:   alert  Skin:   no rash or abnormalities  Lungs:   clear to auscultation bilaterally  Heart:   regular rate and rhythm, S1, S2 normal, no murmur, click, rub or gallop  Breasts:   right breast  engorgement, no erythema.    Abdomen:  normal findings: no organomegaly, soft, non-tender and no hernia  Pelvis:  External genitalia: normal general appearance Urinary system: urethral meatus normal and bladder without fullness, nontender Vaginal: normal without tenderness, induration or masses, +strings present on exam Cervix: normal appearance Adnexa: normal bimanual exam Uterus: anteverted and non-tender, normal size    50% of 15 min  visit spent on counseling and coordination of care.   Data Reviewed Previous medical hx, labs, meds  Assessment     IUD strings present/in place  Recent breastfeeding cessation with right breast engorgement    Plan    No orders of the defined types were placed in this encounter.   No orders of the defined types were placed in this encounter.    Follow up in one year with annual exam.

## 2015-10-19 ENCOUNTER — Encounter (HOSPITAL_COMMUNITY): Payer: Self-pay | Admitting: *Deleted

## 2015-10-19 ENCOUNTER — Inpatient Hospital Stay (HOSPITAL_COMMUNITY)
Admission: AD | Admit: 2015-10-19 | Discharge: 2015-10-19 | Disposition: A | Discharge status: Home / Self Care | Payer: Self-pay | Source: Ambulatory Visit | Attending: Obstetrics | Admitting: Obstetrics

## 2015-10-19 ENCOUNTER — Inpatient Hospital Stay (HOSPITAL_COMMUNITY): Payer: Self-pay

## 2015-10-19 DIAGNOSIS — R109 Unspecified abdominal pain: Secondary | ICD-10-CM

## 2015-10-19 DIAGNOSIS — R1030 Lower abdominal pain, unspecified: Secondary | ICD-10-CM

## 2015-10-19 DIAGNOSIS — R52 Pain, unspecified: Secondary | ICD-10-CM

## 2015-10-19 DIAGNOSIS — Z975 Presence of (intrauterine) contraceptive device: Secondary | ICD-10-CM

## 2015-10-19 DIAGNOSIS — R102 Pelvic and perineal pain: Secondary | ICD-10-CM

## 2015-10-19 LAB — WET PREP, GENITAL
Clue Cells Wet Prep HPF POC: NONE SEEN
SPERM: NONE SEEN
Trich, Wet Prep: NONE SEEN

## 2015-10-19 LAB — URINALYSIS, ROUTINE W REFLEX MICROSCOPIC
Bilirubin Urine: NEGATIVE
GLUCOSE, UA: NEGATIVE mg/dL
KETONES UR: NEGATIVE mg/dL
LEUKOCYTES UA: NEGATIVE
NITRITE: NEGATIVE
PROTEIN: NEGATIVE mg/dL
Specific Gravity, Urine: >1.03 — ABNORMAL HIGH (ref 1.005–1.030)
pH: 5.5 (ref 5.0–8.0)

## 2015-10-19 LAB — POCT PREGNANCY, URINE: PREG TEST UR: NEGATIVE

## 2015-10-19 LAB — URINE MICROSCOPIC-ADD ON

## 2015-10-19 MED ORDER — IBUPROFEN 800 MG PO TABS: 800.0000 mg | ORAL_TABLET | Freq: Three times a day (TID) | ORAL | Status: DC | PRN

## 2015-10-19 MED ORDER — IBUPROFEN 600 MG PO TABS
600.0000 mg | ORAL_TABLET | Freq: Once | ORAL | Status: AC
  Administered 2015-10-19: 600 mg via ORAL
  Filled 2015-10-19: qty 1

## 2015-10-19 MED ORDER — FLUCONAZOLE 150 MG PO TABS: 150.0000 mg | ORAL_TABLET | Freq: Once | ORAL | Status: DC

## 2015-10-19 NOTE — MAU Provider Note (Signed)
History     CSN: [id number]  Arrival date and time: 10/19/15 1750   First Provider Initiated Contact with Patient 10/19/15 1915      Chief Complaint  Patient presents with  . Flank Pain  . Vaginal Pain   HPI   Chelsea Castaneda is a 34 y.o. female [phone] presenting to MAU with pain in her cervix and contraction like pain in her lower abdomen. The pain is described as stabbing pain that shoots all across her abdomen.  The pain worsened today. LMP was three weeks ago.   She had an IUD placed in August and wants to make sure it is in the right place. Dr. Clearance CootsHarper put her IUD in. She did not call her Dr. Isidore Moosffice with this complaint.  Denies abnormal vaginal bleeding or discharge.   She has not taken anything for the pain.     OB History    Gravida Para Term Preterm AB TAB SAB Ectopic Multiple Living   4 4 4       0 4      Past Medical History  Diagnosis Date  . Tuberculosis     inactive  . Blood transfusion without reported diagnosis     no infusion reaction    Past Surgical History  Procedure Laterality Date  . Dilation and curettage of uterus    . Nose surgery    . Cesarean section N/A 04/04/2015    Procedure: CESAREAN SECTION;  Surgeon: Brock Badharles A Harper, MD;  Location: [facility];  Service: Obstetrics;  Laterality: N/A;    Family History  Problem Relation Age of Onset  . Diabetes Mother   . Hypertension Mother     Social History  Substance Use Topics  . Smoking status: Never Smoker   . Smokeless tobacco: Never Used  . Alcohol Use: No    Allergies: No Known Allergies  Prescriptions prior to admission  Medication Sig Dispense Refill Last Dose  . Prenat-FeCbn-FeAspGl-FA-Omega (OB COMPLETE PETITE) 35-5-1-200 MG CAPS Take 1 capsule by mouth daily.   Past Week at Unknown time  . diphenhydrAMINE (BENADRYL) 25 MG tablet Take 1 tablet (25 mg total) by mouth every 6 (six) hours as needed. (Patient not taking: Reported on 10/19/2015) 30 tablet 0   . ibuprofen  (ADVIL,MOTRIN) 800 MG tablet Take 1 tablet (800 mg total) by mouth every 8 (eight) hours as needed. (Patient not taking: Reported on 10/19/2015) 60 tablet 1    Results for orders placed or performed during the hospital encounter of 10/19/15 (from the past 48 hour(s))  Urinalysis, Routine w reflex microscopic (not at [facility])     Status: Abnormal   Collection Time: 10/19/15  6:00 PM  Result Value Ref Range   Color, Urine YELLOW YELLOW   APPearance CLEAR CLEAR   Specific Gravity, Urine >1.030 (H) 1.005 - 1.030   pH 5.5 5.0 - 8.0   Glucose, UA NEGATIVE NEGATIVE mg/dL   Hgb urine dipstick TRACE (A) NEGATIVE   Bilirubin Urine NEGATIVE NEGATIVE   Ketones, ur NEGATIVE NEGATIVE mg/dL   Protein, ur NEGATIVE NEGATIVE mg/dL   Nitrite NEGATIVE NEGATIVE   Leukocytes, UA NEGATIVE NEGATIVE  Urine microscopic-add on     Status: Abnormal   Collection Time: 10/19/15  6:00 PM  Result Value Ref Range   Squamous Epithelial / LPF 0-5 (A) NONE SEEN   WBC, UA 0-5 0 - 5 WBC/hpf   RBC / HPF 0-5 0 - 5 RBC/hpf   Bacteria, UA FEW (A) NONE SEEN  Pregnancy, urine POC     Status: None   Collection Time: 10/19/15  6:14 PM  Result Value Ref Range   Preg Test, Ur NEGATIVE NEGATIVE    Comment:        THE SENSITIVITY OF THIS METHODOLOGY IS >24 mIU/mL     Review of Systems  Constitutional: Negative for fever and chills.  Gastrointestinal: Positive for abdominal pain. Negative for nausea, vomiting, diarrhea and constipation.  Genitourinary: Negative for dysuria.   Physical Exam   Blood pressure 107/73, pulse 79, temperature 98.5 F (36.9 C), resp. rate 18, not currently breastfeeding.  Physical Exam  Constitutional: She is oriented to person, place, and time. She appears well-developed and well-nourished. No distress.  HENT:  Head: Normocephalic.  Eyes: Pupils are equal, round, and reactive to light.  Neck: Neck supple.  Respiratory: Effort normal.  GI: Soft. There is tenderness in the right lower quadrant,  suprapubic area and left lower quadrant. There is no rigidity, no rebound and no guarding.  Genitourinary:  Speculum exam: Vagina - Small amount of mucus like, stringy discharge, no odor Cervix - scant amount of contact bleeding, IUD strings not visualized  Bimanual exam: Cervix closed, IUD strings not palpated, no CMT  Uterus non tender, normal size Adnexa non tender, no masses bilaterally GC/Chlam, wet prep done Chaperone present for exam.  Musculoskeletal: Normal range of motion.  Neurological: She is alert and oriented to person, place, and time.  Skin: Skin is warm. She is not diaphoretic.  Psychiatric: Her behavior is normal.    MAU Course  Procedures  None  MDM  Wet prep GC Pelvic US limited   Report given to Georges Mouse CNM who resumes care of the patient. Patient awaiting [place].   Duane Lope, NP  US Transvaginal Non-ob  10/19/2015  CLINICAL DATA:  Pelvic pain. EXAM: TRANSABDOMINAL AND TRANSVAGINAL ULTRASOUND OF PELVIS TECHNIQUE: Both transabdominal and transvaginal ultrasound examinations of the pelvis were performed. Transabdominal technique was performed for global imaging of the pelvis including uterus, ovaries, adnexal regions, and pelvic cul-de-sac. It was necessary to proceed with endovaginal exam following the transabdominal exam to visualize the endometrium. COMPARISON:  02/19/2015 FINDINGS: Uterus Measurements: 9.3 x 3.7 x 6.5 cm. No fibroids or other mass visualized. Endometrium Thickness: 6.7 mm. IUD is visualized within the endometrial cavity and appears to be in appropriate position. Right ovary Measurements: 3.8 x 2.0 x 2.3 cm. Normal appearance/no adnexal mass. Left ovary Measurements: 3.2 x 1.7 x 1.8 cm. Normal appearance/no adnexal mass. Other findings No free fluid. IMPRESSION: 1. Normal pelvic sonogram. 2. IUD in place. Electronically Signed   By: Signa Kell M.D.   On: 10/19/2015 20:43   US Pelvis Complete  10/19/2015  CLINICAL DATA:  Pelvic  pain. EXAM: TRANSABDOMINAL AND TRANSVAGINAL ULTRASOUND OF PELVIS TECHNIQUE: Both transabdominal and transvaginal ultrasound examinations of the pelvis were performed. Transabdominal technique was performed for global imaging of the pelvis including uterus, ovaries, adnexal regions, and pelvic cul-de-sac. It was necessary to proceed with endovaginal exam following the transabdominal exam to visualize the endometrium. COMPARISON:  02/19/2015 FINDINGS: Uterus Measurements: 9.3 x 3.7 x 6.5 cm. No fibroids or other mass visualized. Endometrium Thickness: 6.7 mm. IUD is visualized within the endometrial cavity and appears to be in appropriate position. Right ovary Measurements: 3.8 x 2.0 x 2.3 cm. Normal appearance/no adnexal mass. Left ovary Measurements: 3.2 x 1.7 x 1.8 cm. Normal appearance/no adnexal mass. Other findings No free fluid. IMPRESSION: 1. Normal pelvic sonogram. 2. IUD  in place. Electronically Signed   By: Signa Kell M.D.   On: 10/19/2015 20:43   Results for orders placed or performed during the hospital encounter of 10/19/15 (from the past 24 hour(s))  Urinalysis, Routine w reflex microscopic (not at [facility])     Status: Abnormal   Collection Time: 10/19/15  6:00 PM  Result Value Ref Range   Color, Urine YELLOW YELLOW   APPearance CLEAR CLEAR   Specific Gravity, Urine >1.030 (H) 1.005 - 1.030   pH 5.5 5.0 - 8.0   Glucose, UA NEGATIVE NEGATIVE mg/dL   Hgb urine dipstick TRACE (A) NEGATIVE   Bilirubin Urine NEGATIVE NEGATIVE   Ketones, ur NEGATIVE NEGATIVE mg/dL   Protein, ur NEGATIVE NEGATIVE mg/dL   Nitrite NEGATIVE NEGATIVE   Leukocytes, UA NEGATIVE NEGATIVE  Urine microscopic-add on     Status: Abnormal   Collection Time: 10/19/15  6:00 PM  Result Value Ref Range   Squamous Epithelial / LPF 0-5 (A) NONE SEEN   WBC, UA 0-5 0 - 5 WBC/hpf   RBC / HPF 0-5 0 - 5 RBC/hpf   Bacteria, UA FEW (A) NONE SEEN  Pregnancy, urine POC     Status: None   Collection Time: 10/19/15  6:14 PM   Result Value Ref Range   Preg Test, Ur NEGATIVE NEGATIVE  Wet prep, genital     Status: Abnormal   Collection Time: 10/19/15  7:23 PM  Result Value Ref Range   Yeast Wet Prep HPF POC PRESENT (A) NONE SEEN   Trich, Wet Prep NONE SEEN NONE SEEN   Clue Cells Wet Prep HPF POC NONE SEEN NONE SEEN   WBC, Wet Prep HPF POC MODERATE (A) NONE SEEN   Sperm NONE SEEN      Assessment and Plan   33 y.o. [id number], pelvic pain w/ IUD in place. Normal pelvic u/s. Yeast only on wet prep, no yeast symptoms. Rx Motrin  q8H PRN pain, Diflucan 150 mg 1 po x 1 PRN yeast symptoms, f/u w/ Dr. Clearance Coots w/ ongoing pain.    Medication List    STOP taking these medications        diphenhydrAMINE 25 MG tablet  Commonly known as:  BENADRYL      TAKE these medications        fluconazole 150 MG tablet  Commonly known as:  DIFLUCAN  Take 1 tablet (150 mg total) by mouth once.     ibuprofen 800 MG tablet  Commonly known as:  ADVIL,MOTRIN  Take 1 tablet (800 mg total) by mouth every 8 (eight) hours as needed.     OB COMPLETE PETITE 35-5-1-200 MG Caps  Take 1 capsule by mouth daily.            Follow-up Information    Follow up with HARPER,CHARLES A, MD.   Specialty:  Obstetrics and Gynecology   Why:  If symptoms continue   Contact information:   [address] [place] [address] [phone]

## 2015-10-19 NOTE — MAU Note (Signed)
Pt reprots hjaving lower pelvic pain and "cervical pain  X 2 days. Denies vag dicharge or bleeding. Has IUD n place. Worried it may have moved causing irritation.

## 2015-10-21 LAB — GC/CHLAMYDIA PROBE AMP (~~LOC~~) NOT AT ARMC
Chlamydia: NEGATIVE
NEISSERIA GONORRHEA: NEGATIVE

## 2015-11-10 ENCOUNTER — Emergency Department (HOSPITAL_COMMUNITY): Payer: Self-pay

## 2015-11-10 ENCOUNTER — Emergency Department (HOSPITAL_COMMUNITY)
Admission: EM | Admit: 2015-11-10 | Discharge: 2015-11-11 | Disposition: A | Discharge status: Home / Self Care | Payer: Self-pay | Attending: Emergency Medicine | Admitting: Emergency Medicine

## 2015-11-10 ENCOUNTER — Encounter (HOSPITAL_COMMUNITY): Payer: Self-pay | Admitting: Emergency Medicine

## 2015-11-10 DIAGNOSIS — Z79899 Other long term (current) drug therapy: Secondary | ICD-10-CM | POA: Insufficient documentation

## 2015-11-10 DIAGNOSIS — R079 Chest pain, unspecified: Secondary | ICD-10-CM | POA: Insufficient documentation

## 2015-11-10 DIAGNOSIS — Z8611 Personal history of tuberculosis: Secondary | ICD-10-CM | POA: Insufficient documentation

## 2015-11-10 LAB — CBC
HEMATOCRIT: 36.3 % (ref 36.0–46.0)
Hemoglobin: 11.8 g/dL — ABNORMAL LOW (ref 12.0–15.0)
MCH: 28.6 pg (ref 26.0–34.0)
MCHC: 32.5 g/dL (ref 30.0–36.0)
MCV: 87.9 fL (ref 78.0–100.0)
Platelets: 253 10*3/uL (ref 150–400)
RBC: 4.13 MIL/uL (ref 3.87–5.11)
RDW: 14.9 % (ref 11.5–15.5)
WBC: 4.7 10*3/uL (ref 4.0–10.5)

## 2015-11-10 NOTE — ED Provider Notes (Signed)
CSN: [id number]     Arrival date & time 11/10/15  2023 History   First MD Initiated Contact with Patient 11/10/15 2140     Chief Complaint  Patient presents with  . Chest Pain     (Consider location/radiation/quality/duration/timing/severity/associated sxs/prior Treatment) Patient is a 35 y.o. female presenting with chest pain. The history is provided by the patient. No language interpreter was used.  Chest Pain Pain location:  L chest Pain quality: aching   Pain radiates to:  Does not radiate Pain radiates to the back: no   Pain severity:  Moderate Onset quality:  Sudden Duration:  5 hours Timing:  Constant Progression:  Unchanged Chronicity:  Recurrent Context: lifting and movement   Context: not breathing and not eating   Relieved by:  Nothing Worsened by:  Nothing tried Ineffective treatments:  None tried Associated symptoms: not vomiting   Risk factors: pregnancy   Risk factors: no high cholesterol, no hypertension and no smoking   Pt reports she had a similar episode 2 weeks ago after lifting her baby.  Pt reports she lifted her lifted her baby tonight before pain.  Pain has continued.    Past Medical History  Diagnosis Date  . Tuberculosis     inactive  . Blood transfusion without reported diagnosis     no infusion reaction   Past Surgical History  Procedure Laterality Date  . Dilation and curettage of uterus    . Nose surgery    . Cesarean section N/A 04/04/2015    Procedure: CESAREAN SECTION;  Surgeon: Brock Badharles A Harper, MD;  Location: [facility];  Service: Obstetrics;  Laterality: N/A;   Family History  Problem Relation Age of Onset  . Diabetes Mother   . Hypertension Mother    Social History  Substance Use Topics  . Smoking status: Never Smoker   . Smokeless tobacco: Never Used  . Alcohol Use: No   OB History    Gravida Para Term Preterm AB TAB SAB Ectopic Multiple Living   4 4 4       0 4     Review of Systems  Cardiovascular: Positive for chest pain.   Gastrointestinal: Negative for vomiting.  All other systems reviewed and are negative.     Allergies  Review of patient's allergies indicates no known allergies.  Home Medications   Prior to Admission medications   Medication Sig Start Date End Date Taking? Authorizing Provider  fluconazole (DIFLUCAN) 150 MG tablet Take 1 tablet (150 mg total) by mouth once. 10/19/15   Archie PattenNatalie K Frazier, CNM  ibuprofen (ADVIL,MOTRIN) 800 MG tablet Take 1 tablet (800 mg total) by mouth every 8 (eight) hours as needed. 10/19/15   Archie PattenNatalie K Frazier, CNM  Prenat-FeCbn-FeAspGl-FA-Omega (OB COMPLETE PETITE) 35-5-1-200 MG CAPS Take 1 capsule by mouth daily.    Historical Provider, MD   BP 118/80 mmHg  Pulse 77  Temp(Src) 98.4 F (36.9 C) (Oral)  Resp 14  SpO2 98% Physical Exam  Constitutional: She appears well-developed and well-nourished.  HENT:  Head: Normocephalic.  Right Ear: External ear normal.  Left Ear: External ear normal.  Nose: Nose normal.  Mouth/Throat: Oropharynx is clear and moist.  Eyes: Conjunctivae are normal. Pupils are equal, round, and reactive to light.  Neck: Normal range of motion. Neck supple.  Cardiovascular: Normal rate and normal heart sounds.   Pulmonary/Chest: Effort normal and breath sounds normal.  Abdominal: Soft.  Musculoskeletal: Normal range of motion.  Neurological: She is alert.  Skin: Skin is  warm.  Psychiatric: She has a normal mood and affect.  Nursing note and vitals reviewed.   ED Course  Procedures (including critical care time) Labs Review Labs Reviewed  CBC - Abnormal; Notable for the following:    Hemoglobin 11.8 (*)    All other components within normal limits  COMPREHENSIVE METABOLIC PANEL - Abnormal; Notable for the following:    Glucose, Bld 105 (*)    All other components within normal limits  TROPONIN I    Imaging Review No results found. I have personally reviewed and evaluated these images and lab results as part of my medical  decision-making.   EKG Interpretation   Date/Time:  Sunday November 10 2015 20:35:27 EST Ventricular Rate:  74 PR Interval:  154 QRS Duration: 89 QT Interval:  382 QTC Calculation: 424 R Axis:   85 Text Interpretation:  Sinus rhythm RSR' in V1 or V2, probably normal  variant Consider left ventricular hypertrophy Confirmed by Lincoln Brigham  [phone]) on 11/10/2015 8:41:09 PM      MDM no relief with gi cocktail.   I suspect pain is muscular.   I will treat with ibuprofen and robaxin    Final diagnoses:  Chest pain, unspecified chest pain type   An After Visit Summary was printed and given to the patient.    Lonia Skinner [place], PA-C 11/11/15 0142  Tilden Fossa, MD 11/11/15 1450

## 2015-11-10 NOTE — ED Notes (Signed)
Per EMS, pt was at home doing homework when she started feeling a squeezing pain in her chest radiating to her back and left arm, mild shortness of breath. Pt had 324 asa at home. Pain 9/10, given 1 nitro en route with no relief. Initial BP-142/90

## 2015-11-10 NOTE — ED Notes (Signed)
To x-ray

## 2015-11-11 LAB — COMPREHENSIVE METABOLIC PANEL
ALT: 16 U/L (ref 14–54)
ANION GAP: 8 (ref 5–15)
AST: 19 U/L (ref 15–41)
Albumin: 3.7 g/dL (ref 3.5–5.0)
Alkaline Phosphatase: 59 U/L (ref 38–126)
BILIRUBIN TOTAL: 0.4 mg/dL (ref 0.3–1.2)
BUN: 14 mg/dL (ref 6–20)
CO2: 28 mmol/L (ref 22–32)
Calcium: 9.5 mg/dL (ref 8.9–10.3)
Chloride: 105 mmol/L (ref 101–111)
Creatinine, Ser: 0.6 mg/dL (ref 0.44–1.00)
GFR calc Af Amer: >60 mL/min (ref >60)
Glucose, Bld: 105 mg/dL — ABNORMAL HIGH (ref 65–99)
POTASSIUM: 4 mmol/L (ref 3.5–5.1)
Sodium: 141 mmol/L (ref 135–145)
TOTAL PROTEIN: 6.8 g/dL (ref 6.5–8.1)

## 2015-11-11 LAB — TROPONIN I

## 2015-11-11 MED ORDER — METHOCARBAMOL 500 MG PO TABS: 500.0000 mg | ORAL_TABLET | Freq: Two times a day (BID) | ORAL | Status: DC

## 2015-11-11 MED ORDER — IBUPROFEN 800 MG PO TABS: 800.0000 mg | ORAL_TABLET | Freq: Three times a day (TID) | ORAL | Status: DC

## 2015-11-11 MED ORDER — GI COCKTAIL ~~LOC~~
30.0000 mL | Freq: Once | ORAL | Status: AC
  Administered 2015-11-11: 30 mL via ORAL
  Filled 2015-11-11: qty 30

## 2015-11-11 MED ORDER — KETOROLAC TROMETHAMINE 30 MG/ML IJ SOLN
30.0000 mg | Freq: Once | INTRAMUSCULAR | Status: AC
  Administered 2015-11-11: 30 mg via INTRAVENOUS
  Filled 2015-11-11: qty 1

## 2015-11-11 NOTE — Discharge Instructions (Signed)

## 2016-01-20 ENCOUNTER — Ambulatory Visit (INDEPENDENT_AMBULATORY_CARE_PROVIDER_SITE_OTHER): Payer: Self-pay | Admitting: Family Medicine

## 2016-01-20 ENCOUNTER — Ambulatory Visit (INDEPENDENT_AMBULATORY_CARE_PROVIDER_SITE_OTHER): Payer: Self-pay

## 2016-01-20 ENCOUNTER — Ambulatory Visit (INDEPENDENT_AMBULATORY_CARE_PROVIDER_SITE_OTHER): Payer: Medicaid Other

## 2016-01-20 VITALS — BP 120/76 | HR 84 | Temp 98.0°F | Resp 16 | Ht 64.0 in | Wt 163.0 lb

## 2016-01-20 DIAGNOSIS — M545 Low back pain, unspecified: Secondary | ICD-10-CM

## 2016-01-20 DIAGNOSIS — M542 Cervicalgia: Secondary | ICD-10-CM

## 2016-01-20 DIAGNOSIS — J069 Acute upper respiratory infection, unspecified: Secondary | ICD-10-CM

## 2016-01-20 DIAGNOSIS — H6123 Impacted cerumen, bilateral: Secondary | ICD-10-CM

## 2016-01-20 MED ORDER — METHOCARBAMOL 500 MG PO TABS: 500.0000 mg | ORAL_TABLET | Freq: Three times a day (TID) | ORAL | Status: DC | PRN

## 2016-01-20 MED ORDER — AMOXICILLIN 500 MG PO TABS: 1000.0000 mg | ORAL_TABLET | Freq: Two times a day (BID) | ORAL | Status: DC

## 2016-01-20 NOTE — Progress Notes (Signed)
Subjective:    Patient ID: Chelsea Castaneda, female    DOB: 1981-05-29, 35 y.o.   MRN: [id number]  01/20/2016  chest pain; Back Pain; Headache; Chills; Ear Pain; and Nasal Congestion   HPI This 35 y.o. female presents for evaluation of THE FOLLOWING:  1.  L ear pain with swollen lymph node swelling: onset three days ago.  +nasal congestion.  S/p sinus surgery.  B ear pain.  No fever but +chills.  No sweats.  No body aches.  +HA; +L ear pain> R ear pain. No drainage in ear; +ringing in L ear.  Ear drops yesterday to L ear.  No coughing. +nausea; no vomiting; no diarrhea.  No flu vaccine.    2.  Back pain:  Chronic pain for four months with acute worsening in past three days.  Some radiation into L arm.  No n/t/burning in legs or arms.  No weakness.  No medications.  3. Chest pain L and L shoulder pain: presented to ED in 11/2015.  Baby is 4010 months old.  No breastfeeding.   Review of Systems  Constitutional: Negative for fever, chills, diaphoresis and fatigue.  Eyes: Negative for visual disturbance.  Respiratory: Negative for cough and shortness of breath.   Cardiovascular: Negative for chest pain, palpitations and leg swelling.  Gastrointestinal: Negative for nausea, vomiting, abdominal pain, diarrhea and constipation.  Endocrine: Negative for cold intolerance, heat intolerance, polydipsia, polyphagia and polyuria.  Neurological: Negative for dizziness, tremors, seizures, syncope, facial asymmetry, speech difficulty, weakness, light-headedness, numbness and headaches.    Past Medical History  Diagnosis Date  . Tuberculosis     inactive  . Blood transfusion without reported diagnosis     no infusion reaction   Past Surgical History  Procedure Laterality Date  . Dilation and curettage of uterus    . Nose surgery    . Cesarean section N/A 04/04/2015    Procedure: CESAREAN SECTION;  Surgeon: Brock Badharles A Harper, MD;  Location: [facility];  Service: Obstetrics;  Laterality: N/A;   No  Known Allergies  Social History   Social History  . Marital Status: Married    Spouse Name: N/A  . Number of Children: N/A  . Years of Education: N/A   Occupational History  . Not on file.   Social History Main Topics  . Smoking status: Never Smoker   . Smokeless tobacco: Never Used  . Alcohol Use: No  . Drug Use: No  . Sexual Activity: Not Currently    Birth Control/ Protection: None, IUD     Comment: paraguard   Other Topics Concern  . Not on file   Social History Narrative   Family History  Problem Relation Age of Onset  . Diabetes Mother   . Hypertension Mother        Objective:    BP 120/76 mmHg  Pulse 84  Temp(Src) 98 F (36.7 C) (Oral)  Resp 16  Ht 5\' 4"  (1.626 m)  Wt 163 lb (73.936 kg)  BMI 27.97 kg/m2  SpO2 98%  LMP 01/14/2016 (Approximate)  Breastfeeding? No Physical Exam  Constitutional: She is oriented to person, place, and time. She appears well-developed and well-nourished. No distress.  HENT:  Head: Normocephalic and atraumatic.  Right Ear: External ear normal.  Left Ear: External ear normal.  Nose: Nose normal.  Mouth/Throat: Oropharynx is clear and moist.  Eyes: Conjunctivae and EOM are normal. Pupils are equal, round, and reactive to light.  Neck: Normal range of motion. Neck supple.  Carotid bruit is not present. No thyromegaly present.  Cardiovascular: Normal rate, regular rhythm, normal heart sounds and intact distal pulses.  Exam reveals no gallop and no friction rub.   No murmur heard. Pulmonary/Chest: Effort normal and breath sounds normal. She has no wheezes. She has no rales.  Abdominal: Soft. Bowel sounds are normal. She exhibits no distension and no mass. There is no tenderness. There is no rebound and no guarding.  Lymphadenopathy:    She has no cervical adenopathy.  Neurological: She is alert and oriented to person, place, and time. No cranial nerve deficit.  Skin: Skin is warm and dry. No rash noted. She is not diaphoretic.  No erythema. No pallor.  Psychiatric: She has a normal mood and affect. Her behavior is normal.        Assessment & Plan:   1. Neck pain   2. Left-sided low back pain without sciatica   3. Acute upper respiratory infection   4. Cerumen impaction, bilateral     Orders Placed This Encounter  Procedures  . DG Cervical Spine 2 or 3 views    Standing Status: Future     Number of Occurrences: 1     Standing Expiration Date: 01/19/2017    Order Specific Question:  Reason for Exam (SYMPTOM  OR DIAGNOSIS REQUIRED)    Answer:  lower back pain for four months with recent worsening    Order Specific Question:  Is the patient pregnant?    Answer:  No    Order Specific Question:  Preferred imaging location?    Answer:  External  . DG Lumbar Spine 2-3 Views    Standing Status: Future     Number of Occurrences: 1     Standing Expiration Date: 01/19/2017    Order Specific Question:  Reason for Exam (SYMPTOM  OR DIAGNOSIS REQUIRED)    Answer:  lower back pain for four months with recent worsening    Order Specific Question:  Is the patient pregnant?    Answer:  No    Order Specific Question:  Preferred imaging location?    Answer:  External  . Ear wax removal    Scheduling Instructions:     BILATERAL   Meds ordered this encounter  Medications  . amoxicillin (AMOXIL) 500 MG tablet    Sig: Take 2 tablets (1,000 mg total) by mouth 2 (two) times daily.    Dispense:  40 tablet    Refill:  0  . DISCONTD: methocarbamol (ROBAXIN) 500 MG tablet    Sig: Take 1 tablet (500 mg total) by mouth every 8 (eight) hours as needed for muscle spasms.    Dispense:  45 tablet    Refill:  0  . methocarbamol (ROBAXIN) 500 MG tablet    Sig: Take 1 tablet (500 mg total) by mouth every 8 (eight) hours as needed for muscle spasms.    Dispense:  45 tablet    Refill:  0    No Follow-up on file.    Laurelyn Terrero Paulita Fujita, M.D. [facility] [address] [place], [place]   [address] [phone] phone [phone] fax

## 2016-01-20 NOTE — Patient Instructions (Addendum)
1.  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     IF you received an x-ray today, you will receive an invoice from [facility] Please contact [facility] at [phone] with questions or concerns regarding your invoice.   IF you received labwork today, you will receive an invoice from [facility]. Please contact [facility] at [phone] with questions or concerns regarding your invoice.   Our billing staff will not be able to assist you with questions regarding bills from these companies.  You will be contacted with the lab results as soon as they are available. The fastest way to get your results is to activate your My Chart account. Instructions are located on the last page of this paperwork. If you have not heard from us regarding the results in 2 weeks, please contact this office.      Low Back Sprain With Rehab A sprain is an injury in which a ligament is torn. The ligaments of the lower back are vulnerable to sprains. However, they are strong and require great force to be injured. These ligaments are important for stabilizing the spinal column. Sprains are classified into three categories. Grade 1 sprains cause pain, but the tendon is not lengthened. Grade 2 sprains include a lengthened ligament, due to the ligament being stretched or partially ruptured. With grade 2 sprains there is still function, although the function may be decreased. Grade 3 sprains involve a complete tear of the tendon or muscle, and function is usually impaired. SYMPTOMS   Severe pain in the lower back.  Sometimes, a feeling of a "pop," "snap," or tear, at the time of injury.  Tenderness and sometimes swelling at the injury site.  Uncommonly, bruising (contusion) within 48 hours of injury.  Muscle spasms in the back. CAUSES  Low back sprains occur when a force is placed on the ligaments that is greater than they can handle. Common causes of  injury include:  Performing a stressful act while off-balance.  Repetitive stressful activities that involve movement of the lower back.  Direct hit (trauma) to the lower back. RISK INCREASES WITH:  Contact sports (football, wrestling).  Collisions (major skiing accidents).  Sports that require throwing or lifting (baseball, weightlifting).  Sports involving twisting of the spine (gymnastics, diving, tennis, golf).  Poor strength and flexibility.  Inadequate protection.  Previous back injury or surgery (especially fusion). PREVENTION  Wear properly fitted and padded protective equipment.  Warm up and stretch properly before activity.  Allow for adequate recovery between workouts.  Maintain physical fitness:  Strength, flexibility, and endurance.  Cardiovascular fitness.  Maintain a healthy body weight. PROGNOSIS  If treated properly, low back sprains usually heal with non-surgical treatment. The length of time for healing depends on the severity of the injury.  RELATED COMPLICATIONS   Recurring symptoms, resulting in a chronic problem.  Chronic inflammation and pain in the low back.  Delayed healing or resolution of symptoms, especially if activity is resumed too soon.  Prolonged impairment.  Unstable or arthritic joints of the low back. TREATMENT  Treatment first involves the use of ice and medicine, to reduce pain and inflammation. The use of strengthening and stretching exercises may help reduce pain with activity. These exercises may be performed at home or with a therapist. Severe injuries may require referral to a therapist for further evaluation and treatment, such as ultrasound. Your caregiver may advise that you wear a back brace or corset, to help reduce pain and  discomfort. Often, prolonged bed rest results in greater harm then benefit. Corticosteroid injections may be recommended. However, these should be reserved for the most serious cases. It is  important to avoid using your back when lifting objects. At night, sleep on your back on a firm mattress, with a pillow placed under your knees. If non-surgical treatment is unsuccessful, surgery may be needed.  MEDICATION   If pain medicine is needed, nonsteroidal anti-inflammatory medicines (aspirin and ibuprofen), or other minor pain relievers (acetaminophen), are often advised.  Do not take pain medicine for 7 days before surgery.  Prescription pain relievers may be given, if your caregiver thinks they are needed. Use only as directed and only as much as you need.  Ointments applied to the skin may be helpful.  Corticosteroid injections may be given by your caregiver. These injections should be reserved for the most serious cases, because they may only be given a certain number of times. HEAT AND COLD  Cold treatment (icing) should be applied for 10 to 15 minutes every 2 to 3 hours for inflammation and pain, and immediately after activity that aggravates your symptoms. Use ice packs or an ice massage.  Heat treatment may be used before performing stretching and strengthening activities prescribed by your caregiver, physical therapist, or athletic trainer. Use a heat pack or a warm water soak. SEEK MEDICAL CARE IF:   Symptoms get worse or do not improve in 2 to 4 weeks, despite treatment.  You develop numbness or weakness in either leg.  You lose bowel or bladder function.  Any of the following occur after surgery: fever, increased pain, swelling, redness, drainage of fluids, or bleeding in the affected area.  New, unexplained symptoms develop. (Drugs used in treatment may produce side effects.) EXERCISES  RANGE OF MOTION (ROM) AND STRETCHING EXERCISES - Low Back Sprain Most people with lower back pain will find that their symptoms get worse with excessive bending forward (flexion) or arching at the lower back (extension). The exercises that will help resolve your symptoms will focus  on the opposite motion.  Your physician, physical therapist or athletic trainer will help you determine which exercises will be most helpful to resolve your lower back pain. Do not complete any exercises without first consulting with your caregiver. Discontinue any exercises which make your symptoms worse, until you speak to your caregiver. If you have pain, numbness or tingling which travels down into your buttocks, leg or foot, the goal of the therapy is for these symptoms to move closer to your back and eventually resolve. Sometimes, these leg symptoms will get better, but your lower back pain may worsen. This is often an indication of progress in your rehabilitation. Be very alert to any changes in your symptoms and the activities in which you participated in the 24 hours prior to the change. Sharing this information with your caregiver will allow him or her to most efficiently treat your condition. These exercises may help you when beginning to rehabilitate your injury. Your symptoms may resolve with or without further involvement from your physician, physical therapist or athletic trainer. While completing these exercises, remember:   Restoring tissue flexibility helps normal motion to return to the joints. This allows healthier, less painful movement and activity.  An effective stretch should be held for at least 30 seconds.  A stretch should never be painful. You should only feel a gentle lengthening or release in the stretched tissue. FLEXION RANGE OF MOTION AND STRETCHING EXERCISES: STRETCH - Flexion,  Single Knee to Chest   Lie on a firm bed or floor with both legs extended in front of you.  Keeping one leg in contact with the floor, bring your opposite knee to your chest. Hold your leg in place by either grabbing behind your thigh or at your knee.  Pull until you feel a gentle stretch in your low back. Hold __________ seconds.  Slowly release your grasp and repeat the exercise with the  opposite side. Repeat __________ times. Complete this exercise __________ times per day.  STRETCH - Flexion, Double Knee to Chest  Lie on a firm bed or floor with both legs extended in front of you.  Keeping one leg in contact with the floor, bring your opposite knee to your chest.  Tense your stomach muscles to support your back and then lift your other knee to your chest. Hold your legs in place by either grabbing behind your thighs or at your knees.  Pull both knees toward your chest until you feel a gentle stretch in your low back. Hold __________ seconds.  Tense your stomach muscles and slowly return one leg at a time to the floor. Repeat __________ times. Complete this exercise __________ times per day.  STRETCH - Low Trunk Rotation  Lie on a firm bed or floor. Keeping your legs in front of you, bend your knees so they are both pointed toward the ceiling and your feet are flat on the floor.  Extend your arms out to the side. This will stabilize your upper body by keeping your shoulders in contact with the floor.  Gently and slowly drop both knees together to one side until you feel a gentle stretch in your low back. Hold for __________ seconds.  Tense your stomach muscles to support your lower back as you bring your knees back to the starting position. Repeat the exercise to the other side. Repeat __________ times. Complete this exercise __________ times per day  EXTENSION RANGE OF MOTION AND FLEXIBILITY EXERCISES: STRETCH - Extension, Prone on Elbows   Lie on your stomach on the floor, a bed will be too soft. Place your palms about shoulder width apart and at the height of your head.  Place your elbows under your shoulders. If this is too painful, stack pillows under your chest.  Allow your body to relax so that your hips drop lower and make contact more completely with the floor.  Hold this position for __________ seconds.  Slowly return to lying flat on the floor. Repeat  __________ times. Complete this exercise __________ times per day.  RANGE OF MOTION - Extension, Prone Press Ups  Lie on your stomach on the floor, a bed will be too soft. Place your palms about shoulder width apart and at the height of your head.  Keeping your back as relaxed as possible, slowly straighten your elbows while keeping your hips on the floor. You may adjust the placement of your hands to maximize your comfort. As you gain motion, your hands will come more underneath your shoulders.  Hold this position __________ seconds.  Slowly return to lying flat on the floor. Repeat __________ times. Complete this exercise __________ times per day.  RANGE OF MOTION- Quadruped, Neutral Spine   Assume a hands and knees position on a firm surface. Keep your hands under your shoulders and your knees under your hips. You may place padding under your knees for comfort.  Drop your head and point your tailbone toward the ground below you.  This will round out your lower back like an angry cat. Hold this position for __________ seconds.  Slowly lift your head and release your tail bone so that your back sags into a large arch, like an old horse.  Hold this position for __________ seconds.  Repeat this until you feel limber in your low back.  Now, find your "sweet spot." This will be the most comfortable position somewhere between the two previous positions. This is your neutral spine. Once you have found this position, tense your stomach muscles to support your low back.  Hold this position for __________ seconds. Repeat __________ times. Complete this exercise __________ times per day.  STRENGTHENING EXERCISES - Low Back Sprain These exercises may help you when beginning to rehabilitate your injury. These exercises should be done near your "sweet spot." This is the neutral, low-back arch, somewhere between fully rounded and fully arched, that is your least painful position. When performed in this  safe range of motion, these exercises can be used for people who have either a flexion or extension based injury. These exercises may resolve your symptoms with or without further involvement from your physician, physical therapist or athletic trainer. While completing these exercises, remember:   Muscles can gain both the endurance and the strength needed for everyday activities through controlled exercises.  Complete these exercises as instructed by your physician, physical therapist or athletic trainer. Increase the resistance and repetitions only as guided.  You may experience muscle soreness or fatigue, but the pain or discomfort you are trying to eliminate should never worsen during these exercises. If this pain does worsen, stop and make certain you are following the directions exactly. If the pain is still present after adjustments, discontinue the exercise until you can discuss the trouble with your caregiver. STRENGTHENING - Deep Abdominals, Pelvic Tilt   Lie on a firm bed or floor. Keeping your legs in front of you, bend your knees so they are both pointed toward the ceiling and your feet are flat on the floor.  Tense your lower abdominal muscles to press your low back into the floor. This motion will rotate your pelvis so that your tail bone is scooping upwards rather than pointing at your feet or into the floor. With a gentle tension and even breathing, hold this position for __________ seconds. Repeat __________ times. Complete this exercise __________ times per day.  STRENGTHENING - Abdominals, Crunches   Lie on a firm bed or floor. Keeping your legs in front of you, bend your knees so they are both pointed toward the ceiling and your feet are flat on the floor. Cross your arms over your chest.  Slightly tip your chin down without bending your neck.  Tense your abdominals and slowly lift your trunk high enough to just clear your shoulder blades. Lifting higher can put excessive  stress on the lower back and does not further strengthen your abdominal muscles.  Control your return to the starting position. Repeat __________ times. Complete this exercise __________ times per day.  STRENGTHENING - Quadruped, Opposite UE/LE Lift   Assume a hands and knees position on a firm surface. Keep your hands under your shoulders and your knees under your hips. You may place padding under your knees for comfort.  Find your neutral spine and gently tense your abdominal muscles so that you can maintain this position. Your shoulders and hips should form a rectangle that is parallel with the floor and is not twisted.  Keeping your trunk  steady, lift your right hand no higher than your shoulder and then your left leg no higher than your hip. Make sure you are not holding your breath. Hold this position for __________ seconds.  Continuing to keep your abdominal muscles tense and your back steady, slowly return to your starting position. Repeat with the opposite arm and leg. Repeat __________ times. Complete this exercise __________ times per day.  STRENGTHENING - Abdominals and Quadriceps, Straight Leg Raise   Lie on a firm bed or floor with both legs extended in front of you.  Keeping one leg in contact with the floor, bend the other knee so that your foot can rest flat on the floor.  Find your neutral spine, and tense your abdominal muscles to maintain your spinal position throughout the exercise.  Slowly lift your straight leg off the floor about 6 inches for a count of 15, making sure to not hold your breath.  Still keeping your neutral spine, slowly lower your leg all the way to the floor. Repeat this exercise with each leg __________ times. Complete this exercise __________ times per day. POSTURE AND BODY MECHANICS CONSIDERATIONS - Low Back Sprain Keeping correct posture when sitting, standing or completing your activities will reduce the stress put on different body tissues,  allowing injured tissues a chance to heal and limiting painful experiences. The following are general guidelines for improved posture. Your physician or physical therapist will provide you with any instructions specific to your needs. While reading these guidelines, remember:  The exercises prescribed by your provider will help you have the flexibility and strength to maintain correct postures.  The correct posture provides the best environment for your joints to work. All of your joints have less wear and tear when properly supported by a spine with good posture. This means you will experience a healthier, less painful body.  Correct posture must be practiced with all of your activities, especially prolonged sitting and standing. Correct posture is as important when doing repetitive low-stress activities (typing) as it is when doing a single heavy-load activity (lifting). RESTING POSITIONS Consider which positions are most painful for you when choosing a resting position. If you have pain with flexion-based activities (sitting, bending, stooping, squatting), choose a position that allows you to rest in a less flexed posture. You would want to avoid curling into a fetal position on your side. If your pain worsens with extension-based activities (prolonged standing, working overhead), avoid resting in an extended position such as sleeping on your stomach. Most people will find more comfort when they rest with their spine in a more neutral position, neither too rounded nor too arched. Lying on a non-sagging bed on your side with a pillow between your knees, or on your back with a pillow under your knees will often provide some relief. Keep in mind, being in any one position for a prolonged period of time, no matter how correct your posture, can still lead to stiffness. PROPER SITTING POSTURE In order to minimize stress and discomfort on your spine, you must sit with correct posture. Sitting with good posture  should be effortless for a healthy body. Returning to good posture is a gradual process. Many people can work toward this most comfortably by using various supports until they have the flexibility and strength to maintain this posture on their own. When sitting with proper posture, your ears will fall over your shoulders and your shoulders will fall over your hips. You should use the back of the  chair to support your upper back. Your lower back will be in a neutral position, just slightly arched. You may place a small pillow or folded towel at the base of your lower back for  support.  When working at a desk, create an environment that supports good, upright posture. Without extra support, muscles tire, which leads to excessive strain on joints and other tissues. Keep these recommendations in mind: CHAIR:  A chair should be able to slide under your desk when your back makes contact with the back of the chair. This allows you to work closely.  The chair's height should allow your eyes to be level with the upper part of your monitor and your hands to be slightly lower than your elbows. BODY POSITION  Your feet should make contact with the floor. If this is not possible, use a foot rest.  Keep your ears over your shoulders. This will reduce stress on your neck and low back. INCORRECT SITTING POSTURES  If you are feeling tired and unable to assume a healthy sitting posture, do not slouch or slump. This puts excessive strain on your back tissues, causing more damage and pain. Healthier options include:  Using more support, like a lumbar pillow.  Switching tasks to something that requires you to be upright or walking.  Talking a brief walk.  Lying down to rest in a neutral-spine position. PROLONGED STANDING WHILE SLIGHTLY LEANING FORWARD  When completing a task that requires you to lean forward while standing in one place for a long time, place either foot up on a stationary 2-4 inch high object  to help maintain the best posture. When both feet are on the ground, the lower back tends to lose its slight inward curve. If this curve flattens (or becomes too large), then the back and your other joints will experience too much stress, tire more quickly, and can cause pain. CORRECT STANDING POSTURES Proper standing posture should be assumed with all daily activities, even if they only take a few moments, like when brushing your teeth. As in sitting, your ears should fall over your shoulders and your shoulders should fall over your hips. You should keep a slight tension in your abdominal muscles to brace your spine. Your tailbone should point down to the ground, not behind your body, resulting in an over-extended swayback posture.  INCORRECT STANDING POSTURES  Common incorrect standing postures include a forward head, locked knees and/or an excessive swayback. WALKING Walk with an upright posture. Your ears, shoulders and hips should all line-up. PROLONGED ACTIVITY IN A FLEXED POSITION When completing a task that requires you to bend forward at your waist or lean over a low surface, try to find a way to stabilize 3 out of 4 of your limbs. You can place a hand or elbow on your thigh or rest a knee on the surface you are reaching across. This will provide you more stability, so that your muscles do not tire as quickly. By keeping your knees relaxed, or slightly bent, you will also reduce stress across your lower back. CORRECT LIFTING TECHNIQUES DO :  Assume a wide stance. This will provide you more stability and the opportunity to get as close as possible to the object which you are lifting.  Tense your abdominals to brace your spine. Bend at the knees and hips. Keeping your back locked in a neutral-spine position, lift using your leg muscles. Lift with your legs, keeping your back straight.  Test the weight of unknown  objects before attempting to lift them.  Try to keep your elbows locked down at  your sides in order get the best strength from your shoulders when carrying an object.  Always ask for help when lifting heavy or awkward objects. INCORRECT LIFTING TECHNIQUES DO NOT:   Lock your knees when lifting, even if it is a small object.  Bend and twist. Pivot at your feet or move your feet when needing to change directions.  Assume that you can safely pick up even a paperclip without proper posture.   This information is not intended to replace advice given to you by your health care provider. Make sure you discuss any questions you have with your health care provider.   Document Released: 10/19/2005 Document Revised: 11/09/2014 Document Reviewed: 01/31/2009 [employer] Interactive Patient Education [employer]

## 2016-02-13 ENCOUNTER — Encounter (HOSPITAL_COMMUNITY): Payer: Self-pay

## 2016-02-13 ENCOUNTER — Emergency Department (HOSPITAL_COMMUNITY)
Admission: EM | Admit: 2016-02-13 | Discharge: 2016-02-14 | Disposition: A | Discharge status: Home / Self Care | Payer: Self-pay | Attending: Emergency Medicine | Admitting: Emergency Medicine

## 2016-02-13 DIAGNOSIS — Z3202 Encounter for pregnancy test, result negative: Secondary | ICD-10-CM | POA: Insufficient documentation

## 2016-02-13 DIAGNOSIS — Z792 Long term (current) use of antibiotics: Secondary | ICD-10-CM | POA: Insufficient documentation

## 2016-02-13 DIAGNOSIS — Z9889 Other specified postprocedural states: Secondary | ICD-10-CM | POA: Insufficient documentation

## 2016-02-13 DIAGNOSIS — Z8611 Personal history of tuberculosis: Secondary | ICD-10-CM | POA: Insufficient documentation

## 2016-02-13 DIAGNOSIS — R350 Frequency of micturition: Secondary | ICD-10-CM | POA: Insufficient documentation

## 2016-02-13 DIAGNOSIS — M545 Low back pain: Secondary | ICD-10-CM | POA: Insufficient documentation

## 2016-02-13 DIAGNOSIS — R1084 Generalized abdominal pain: Secondary | ICD-10-CM | POA: Insufficient documentation

## 2016-02-13 DIAGNOSIS — R109 Unspecified abdominal pain: Secondary | ICD-10-CM

## 2016-02-13 LAB — URINALYSIS, ROUTINE W REFLEX MICROSCOPIC
Bilirubin Urine: NEGATIVE
Glucose, UA: NEGATIVE mg/dL
Hgb urine dipstick: NEGATIVE
Ketones, ur: NEGATIVE mg/dL
NITRITE: NEGATIVE
PH: 8 (ref 5.0–8.0)
PROTEIN: 30 mg/dL — AB
Specific Gravity, Urine: 1.027 (ref 1.005–1.030)

## 2016-02-13 LAB — URINE MICROSCOPIC-ADD ON: RBC / HPF: NONE SEEN RBC/hpf (ref 0–5)

## 2016-02-13 LAB — POC URINE PREG, ED: Preg Test, Ur: NEGATIVE

## 2016-02-13 MED ORDER — SODIUM CHLORIDE 0.9 % IV BOLUS (SEPSIS)
1000.0000 mL | Freq: Once | INTRAVENOUS | Status: AC
  Administered 2016-02-14: 1000 mL via INTRAVENOUS

## 2016-02-13 MED ORDER — MORPHINE SULFATE (PF) 4 MG/ML IV SOLN
6.0000 mg | Freq: Once | INTRAVENOUS | Status: AC
  Administered 2016-02-14: 6 mg via INTRAVENOUS
  Filled 2016-02-13: qty 2

## 2016-02-13 MED ORDER — KETOROLAC TROMETHAMINE 30 MG/ML IJ SOLN
30.0000 mg | Freq: Once | INTRAMUSCULAR | Status: AC
  Administered 2016-02-14: 30 mg via INTRAVENOUS
  Filled 2016-02-13: qty 1

## 2016-02-13 NOTE — ED Notes (Signed)
Pt reports left flank pain that radiates to lower abdomen. She reports frequent urination. Denies dysuria. She also reports generalized weakness and nausea, denies vomiting. Symptoms started 6 days ago.

## 2016-02-13 NOTE — ED Provider Notes (Signed)
CSN: [id number]     Arrival date & time 02/13/16  1805 History  By signing my name below, I, Arianna Nassar, attest that this documentation has been prepared under the direction and in the presence of Tomasita Crumble, MD. Electronically Signed: Octavia Heir, ED Scribe. 02/13/2016. 11:52 PM.     Chief Complaint  Patient presents with  . Flank Pain      The history is provided by the patient. No language interpreter was used.   HPI Comments: Chelsea Castaneda is a 35 y.o. female who has a hx of TB presents to the Emergency Department complaining of sudden onset, gradual worsening, bilateral flank pain that radiates to her lower abdomen onset 6 days ago. She notes feeling "hot" whenever her pain increases. Pt says whenever the pain radiates to her abdomen, she has urinary frequency and she cannot wait to use the restroom. She states she just recently got over the flu.  Pt has been recently been traveling on long rides. There are no modifying factors but states sitting and standing makes her pain worse. Pt says she has a hx of herniated discs in the past. Pt has not taken any medication to alleviate her pain. Denies vomiting, diarrhea, dysuria, abnormal vaginal bleeding, abnormal vaginal discharge, or hematuria.  Past Medical History  Diagnosis Date  . Tuberculosis     inactive  . Blood transfusion without reported diagnosis     no infusion reaction   Past Surgical History  Procedure Laterality Date  . Dilation and curettage of uterus    . Nose surgery    . Cesarean section N/A 04/04/2015    Procedure: CESAREAN SECTION;  Surgeon: Brock Bad, MD;  Location: [facility];  Service: Obstetrics;  Laterality: N/A;   Family History  Problem Relation Age of Onset  . Diabetes Mother   . Hypertension Mother    Social History  Substance Use Topics  . Smoking status: Never Smoker   . Smokeless tobacco: Never Used  . Alcohol Use: No   OB History    Gravida Para Term Preterm AB TAB SAB Ectopic  Multiple Living   0 4     Review of Systems  A complete 10 system review of systems was obtained and all systems are negative except as noted in the HPI and PMH.    Allergies  Review of patient's allergies indicates no known allergies.  Home Medications   Prior to Admission medications   Medication Sig Start Date End Date Taking? Authorizing Provider  amoxicillin (AMOXIL) 500 MG tablet Take 2 tablets (1,000 mg total) by mouth 2 (two) times daily. 01/20/16   Ethelda Chick, MD  methocarbamol (ROBAXIN) 500 MG tablet Take 1 tablet (500 mg total) by mouth every 8 (eight) hours as needed for muscle spasms. 01/20/16   Ethelda Chick, MD   Triage vitals: BP 115/80 mmHg  Pulse 82  Temp(Src) 99.2 F (37.3 C) (Oral)  Resp 16  SpO2 100%  LMP 01/14/2016 (Approximate) Physical Exam  Constitutional: She is oriented to person, place, and time. She appears well-developed and well-nourished. No distress.  HENT:  Head: Normocephalic and atraumatic.  Nose: Nose normal.  Mouth/Throat: Oropharynx is clear and moist. No oropharyngeal exudate.  Eyes: Conjunctivae and EOM are normal. Pupils are equal, round, and reactive to light. No scleral icterus.  Neck: Normal range of motion. Neck supple. No JVD present. No tracheal deviation present. No thyromegaly present.  Cardiovascular: Normal  rate, regular rhythm and normal heart sounds.  Exam reveals no gallop and no friction rub.   No murmur heard. Pulmonary/Chest: Effort normal and breath sounds normal. No respiratory distress. She has no wheezes. She exhibits no tenderness.  Abdominal: Soft. Bowel sounds are normal. She exhibits no distension and no mass. There is tenderness. There is no rebound and no guarding.  Musculoskeletal: Normal range of motion. She exhibits no edema or tenderness.  Diffuse TTP of abdomen and lower back  Lymphadenopathy:    She has no cervical adenopathy.  Neurological: She is alert and oriented to person, place, and  time. No cranial nerve deficit. She exhibits normal muscle tone.  Skin: Skin is warm and dry. No rash noted. No erythema. No pallor.  Nursing note and vitals reviewed.   ED Course  Procedures  DIAGNOSTIC STUDIES: Oxygen Saturation is 100% on RA, normal by my interpretation.  COORDINATION OF CARE:  11:50 PM Discussed treatment plan with pt at bedside and pt agreed to plan.  Labs Review Labs Reviewed  URINALYSIS, ROUTINE W REFLEX MICROSCOPIC (NOT AT [facility]) - Abnormal; Notable for the following:    APPearance TURBID (*)    Protein, ur 30 (*)    Leukocytes, UA MODERATE (*)    All other components within normal limits  URINE MICROSCOPIC-ADD ON - Abnormal; Notable for the following:    Squamous Epithelial / LPF 6-30 (*)    Bacteria, UA FEW (*)    All other components within normal limits  CBC WITH DIFFERENTIAL/PLATELET - Abnormal; Notable for the following:    WBC 3.4 (*)    All other components within normal limits  COMPREHENSIVE METABOLIC PANEL - Abnormal; Notable for the following:    Total Bilirubin 0.1 (*)    All other components within normal limits  LIPASE, BLOOD  POC URINE PREG, ED    Imaging Review Ct Renal Stone Study  02/14/2016  CLINICAL DATA:  Right flank pain for 6 days. EXAM: CT ABDOMEN AND PELVIS WITHOUT CONTRAST TECHNIQUE: Multidetector CT imaging of the abdomen and pelvis was performed following the standard protocol without IV contrast. COMPARISON:  None. FINDINGS: There are no urinary calculi. There is no hydronephrosis or ureteral dilatation. There are unremarkable unenhanced appearances of the liver, bile ducts, gallbladder, spleen, pancreas, adrenals and kidneys. There are normal appearances of the stomach, small bowel and colon. The appendix is normal. The abdominal aorta is normal in caliber. There is no atherosclerotic calcification. There is no adenopathy in the abdomen or pelvis. Uterus and ovaries are remarkable only for presence of an IUD which appears  satisfactorily positioned. There is no significant abnormality in the lower chest. Mild linear scarring or atelectasis is present in the bases. There is no significant musculoskeletal lesion. IMPRESSION: No significant abnormality is evident in the abdomen or pelvis. Electronically Signed   By: Ellery Plunkaniel R Mitchell M.D.   On: 02/14/2016 03:22   I have personally reviewed and evaluated these images and lab results as part of my medical decision-making.   EKG Interpretation None      MDM   Final diagnoses:  None   Patient presents to the ED for B flank pain with radiation to the abdomen.  She states it also intermittently radiates to her buttocks but not done her legs.  This is possibly a renal stone vs radiculopathy.  Physical exam shows diffuse tenderness throughout back and abdomen, very nonspecific.  Will obtain CT renal stone for evaluation, patient given morphine, toradol and IVF.  UA  does not show overwhelming infection, will consider treating if all else is negative.  3:39 AM CT is negative for stones.  She feels better after pain medications.  Will DC with tramadol to take as needed.  She may have lumbar pathology.  PCP fu advised.  She appears well and in NAD.  VS remain within her normal limits and she is safe for Dc.    I personally performed the services described in this documentation, which was scribed in my presence. The recorded information has been reviewed and is accurate.     Tomasita Crumble, MD 02/14/16 [phone]

## 2016-02-14 ENCOUNTER — Emergency Department (HOSPITAL_COMMUNITY): Payer: Self-pay

## 2016-02-14 LAB — COMPREHENSIVE METABOLIC PANEL
ALT: 14 U/L (ref 14–54)
ANION GAP: 8 (ref 5–15)
AST: 20 U/L (ref 15–41)
Albumin: 3.9 g/dL (ref 3.5–5.0)
Alkaline Phosphatase: 50 U/L (ref 38–126)
BILIRUBIN TOTAL: 0.1 mg/dL — AB (ref 0.3–1.2)
BUN: 15 mg/dL (ref 6–20)
CHLORIDE: 105 mmol/L (ref 101–111)
CO2: 26 mmol/L (ref 22–32)
Calcium: 9.2 mg/dL (ref 8.9–10.3)
Creatinine, Ser: 0.68 mg/dL (ref 0.44–1.00)
GFR calc Af Amer: >60 mL/min (ref >60)
Glucose, Bld: 91 mg/dL (ref 65–99)
POTASSIUM: 3.6 mmol/L (ref 3.5–5.1)
Sodium: 139 mmol/L (ref 135–145)
TOTAL PROTEIN: 7.4 g/dL (ref 6.5–8.1)

## 2016-02-14 LAB — CBC WITH DIFFERENTIAL/PLATELET
BASOS ABS: 0 10*3/uL (ref 0.0–0.1)
BASOS PCT: 0 %
EOS PCT: 2 %
Eosinophils Absolute: 0.1 10*3/uL (ref 0.0–0.7)
HEMATOCRIT: 38.2 % (ref 36.0–46.0)
Hemoglobin: 12.7 g/dL (ref 12.0–15.0)
LYMPHS PCT: 37 %
Lymphs Abs: 1.3 10*3/uL (ref 0.7–4.0)
MCH: 29.7 pg (ref 26.0–34.0)
MCHC: 33.2 g/dL (ref 30.0–36.0)
MCV: 89.5 fL (ref 78.0–100.0)
MONO ABS: 0.4 10*3/uL (ref 0.1–1.0)
MONOS PCT: 12 %
NEUTROS ABS: 1.7 10*3/uL (ref 1.7–7.7)
Neutrophils Relative %: 49 %
PLATELETS: 190 10*3/uL (ref 150–400)
RBC: 4.27 MIL/uL (ref 3.87–5.11)
RDW: 14.8 % (ref 11.5–15.5)
WBC: 3.4 10*3/uL — ABNORMAL LOW (ref 4.0–10.5)

## 2016-02-14 LAB — LIPASE, BLOOD: LIPASE: 25 U/L (ref 11–51)

## 2016-02-14 MED ORDER — TRAMADOL HCL 50 MG PO TABS: 50.0000 mg | ORAL_TABLET | Freq: Two times a day (BID) | ORAL | Status: DC | PRN

## 2016-02-14 NOTE — Discharge Instructions (Signed)
Flank Pain [name], you need to see a primary care doctor within 3 days for close follow up.  Your CT scan does not show any kidney stones.  Take tylenol or ibuprofen at home for pain control.  If the pain becomes severe, take tramadol.  Come back to the ED immediately for any worsening symptoms.  Thank you. Flank pain is pain in your side. The flank is the area of your side between your upper belly (abdomen) and your back. Pain in this area can be caused by many different things. HOME CARE Home care and treatment will depend on the cause of your pain.  Rest as told by your doctor.  Drink enough fluids to keep your pee (urine) clear or pale yellow.  Only take medicine as told by your doctor.  Tell your doctor about any changes in your pain.  Follow up with your doctor. GET HELP RIGHT AWAY IF:   Your pain does not get better with medicine.   You have new symptoms or your symptoms get worse.  Your pain gets worse.   You have belly (abdominal) pain.   You are short of breath.   You always feel sick to your stomach (nauseous).   You keep throwing up (vomiting).   You have puffiness (swelling) in your belly.   You feel light-headed or you pass out (faint).   You have blood in your pee.  You have a fever or lasting symptoms for more than 2-3 days.  You have a fever and your symptoms suddenly get worse. MAKE SURE YOU:   Understand these instructions.  Will watch your condition.  Will get help right away if you are not doing well or get worse.   This information is not intended to replace advice given to you by your health care provider. Make sure you discuss any questions you have with your health care provider.   Document Released: 07/28/2008 Document Revised: 11/09/2014 Document Reviewed: 06/02/2012 Elsevier Interactive Patient Education [employer]2016 [employer] Inc[employer]

## 2016-02-14 NOTE — ED Notes (Signed)
Pt reports understanding of all discharge orders; prescription (Tramadol); and follow-up care.

## 2016-06-06 ENCOUNTER — Emergency Department (HOSPITAL_COMMUNITY)
Admission: EM | Admit: 2016-06-06 | Discharge: 2016-06-06 | Disposition: A | Discharge status: Home / Self Care | Payer: Self-pay | Attending: Emergency Medicine | Admitting: Emergency Medicine

## 2016-06-06 ENCOUNTER — Emergency Department (HOSPITAL_COMMUNITY): Payer: Self-pay

## 2016-06-06 ENCOUNTER — Encounter (HOSPITAL_COMMUNITY): Payer: Self-pay | Admitting: *Deleted

## 2016-06-06 DIAGNOSIS — R072 Precordial pain: Secondary | ICD-10-CM

## 2016-06-06 LAB — CBC
HEMATOCRIT: 37.2 % (ref 36.0–46.0)
HEMOGLOBIN: 12.1 g/dL (ref 12.0–15.0)
MCH: 29.8 pg (ref 26.0–34.0)
MCHC: 32.5 g/dL (ref 30.0–36.0)
MCV: 91.6 fL (ref 78.0–100.0)
Platelets: 199 10*3/uL (ref 150–400)
RBC: 4.06 MIL/uL (ref 3.87–5.11)
RDW: 14.1 % (ref 11.5–15.5)
WBC: 4.5 10*3/uL (ref 4.0–10.5)

## 2016-06-06 LAB — BASIC METABOLIC PANEL
ANION GAP: 5 (ref 5–15)
BUN: 17 mg/dL (ref 6–20)
CHLORIDE: 106 mmol/L (ref 101–111)
CO2: 27 mmol/L (ref 22–32)
Calcium: 9.4 mg/dL (ref 8.9–10.3)
Creatinine, Ser: 0.71 mg/dL (ref 0.44–1.00)
GFR calc non Af Amer: >60 mL/min (ref >60)
GLUCOSE: 93 mg/dL (ref 65–99)
POTASSIUM: 3.7 mmol/L (ref 3.5–5.1)
Sodium: 138 mmol/L (ref 135–145)

## 2016-06-06 LAB — I-STAT TROPONIN, ED: TROPONIN I, POC: 0 ng/mL (ref 0.00–0.08)

## 2016-06-06 NOTE — ED Triage Notes (Signed)
Pt c/o left sided chest pain radiating down left side starting this morning. Pain is associated with some shortness of breath, dizziness and lightheadedness. Pt denies n/v, diaphoresis.

## 2016-06-06 NOTE — Discharge Instructions (Signed)
We saw you in the ER for the chest pain/shortness of breath. All of our cardiac workup is normal, including labs, EKG and chest X-RAY are normal. We are not sure what is causing your discomfort, but we feel comfortable sending you home at this time. The workup in the ER is not complete, and you should follow up with your primary care doctor for further evaluation.  TAKE ASPIRIN DAILY -81 MG. Please return to the ER if you have worsening chest pain, shortness of breath, pain radiating to your jaw, shoulder, or back, sweats or fainting. Otherwise see the Cardiologist or your primary care doctor as requested.

## 2016-06-06 NOTE — ED Provider Notes (Signed)
[facility]-EMERGENCY DEPT Provider Note   CSN: [id number] Arrival date & time: 06/06/16  0002  First Provider Contact:  First MD Initiated Contact with Patient 06/06/16 0044   By signing my name below, I, Linna Darner, attest that this documentation has been prepared under the direction and in the presence of physician practitioner, Derwood Kaplan, MD. Electronically Signed: Linna Darner, Scribe. 06/06/2016. 12:44 AM.   History   Chief Complaint Chief Complaint  Patient presents with  . Chest Pain    The history is provided by the patient. No language interpreter was used.     HPI Comments: Chelsea Castaneda is a 35 y.o. female who presents to the Emergency Department complaining of sudden onset, constant, sharp, gradually improving, central chest pain beginning yesterday morning. Pt reports pain radiation into her left neck, left arm, and left torso. She notes some SOB when her CP was most severe. The pain is constant. Pt reports she has experienced two similar episodes in the past; her last such episode was 2 months ago and she was seen in the ER. She reports chest pain exacerbation with certain positions and movements, specifically while lying down. She denies chest pain exacerbation with exertion. Pt has not seen a cardiologist for her h/o of CP. She denies using regular medications, recent long travel, h/o cancer, h/o blood clot, h/o smoking or substance abuse, or FHx of heart attack. She further denies numbness, weakness, or any other associated symptoms.    Past Medical History:  Diagnosis Date  . Blood transfusion without reported diagnosis    no infusion reaction  . Tuberculosis    inactive    Patient Active Problem List   Diagnosis Date Noted  . S/P cesarean section 04/05/2015  . Unspecified vitamin D deficiency 10/27/2013    Past Surgical History:  Procedure Laterality Date  . CESAREAN SECTION N/A 04/04/2015   Procedure: CESAREAN SECTION;  Surgeon: Brock Bad,  MD;  Location: [facility];  Service: Obstetrics;  Laterality: N/A;  . DILATION AND CURETTAGE OF UTERUS    . NOSE SURGERY      OB History    Gravida Para Term Preterm AB Living   4 4 4     4    SAB TAB Ectopic Multiple Live Births         0 4       Home Medications    Prior to Admission medications   Medication Sig Start Date End Date Taking? Authorizing Provider  traMADol (ULTRAM) 50 MG tablet Take 1 tablet (50 mg total) by mouth every 12 (twelve) hours as needed. 02/14/16   Tomasita Crumble, MD    Family History Family History  Problem Relation Age of Onset  . Diabetes Mother   . Hypertension Mother     Social History Social History  Substance Use Topics  . Smoking status: Never Smoker  . Smokeless tobacco: Never Used  . Alcohol use No     Allergies   Review of patient's allergies indicates no known allergies.   Review of Systems Review of Systems  A complete 10 system review of systems was obtained and all systems are negative except as noted in the HPI and PMH.   Physical Exam Updated Vital Signs BP 123/80 (BP Location: Right Arm)   Pulse 65   Temp 98.5 F (36.9 C) (Oral)   Resp 12   Ht 5\' 6"  (1.676 m)   Wt 159 lb 6 oz (72.3 kg)   LMP 05/25/2016   SpO2  100%   BMI 25.72 kg/m   Physical Exam  Constitutional: She is oriented to person, place, and time. She appears well-developed and well-nourished. No distress.  HENT:  Head: Normocephalic and atraumatic.  Eyes: Conjunctivae and EOM are normal.  Neck: Neck supple. No tracheal deviation present.  Cardiovascular: Normal rate and regular rhythm.   No JVD.  Pulmonary/Chest: Effort normal. No respiratory distress.  Lungs are clear to auscultation.  Abdominal: Soft.  Musculoskeletal: Normal range of motion.  No pitting edema or unilateral calf swelling. Radial pulse equal bilaterally.  Neurological: She is alert and oriented to person, place, and time.  Skin: Skin is warm and dry.  Psychiatric: She has a  normal mood and affect. Her behavior is normal.  Nursing note and vitals reviewed.   ED Treatments / Results  Labs (all labs ordered are listed, but only abnormal results are displayed) Labs Reviewed  BASIC METABOLIC PANEL  CBC  I-STAT TROPOININ, ED    EKG  EKG Interpretation  Date/Time:  Saturday June 06 2016 00:20:06 EDT Ventricular Rate:  72 PR Interval:  148 QRS Duration: 86 QT Interval:  378 QTC Calculation: 413 R Axis:   80 Text Interpretation:  Normal sinus rhythm Normal ECG No acute changes No significant change since last tracing Confirmed by Rhunette Croft, MD, Janey Genta [phone]) on 06/06/2016 12:22:38 AM       Radiology Dg Chest 2 View  Result Date: 06/06/2016 CLINICAL DATA:  Chest pain beginning this evening. Shortness of breath and dizziness. EXAM: CHEST  2 VIEW COMPARISON:  Chest radiograph November 10, 2015 FINDINGS: Cardiomediastinal silhouette is normal. The lungs are clear without pleural effusions or focal consolidations. Trachea projects midline and there is no pneumothorax. Soft tissue planes and included osseous structures are non-suspicious. IMPRESSION: Normal chest. Electronically Signed   By: Awilda Metro M.D.   On: 06/06/2016 00:46    Procedures Procedures (including critical care time)  DIAGNOSTIC STUDIES: Oxygen Saturation is 100% on RA, normal by my interpretation.    COORDINATION OF CARE: 12:44 AM Discussed treatment plan with pt at bedside and pt agreed to plan.   Medications Ordered in ED Medications - No data to display   Initial Impression / Assessment and Plan / ED Course  I have reviewed the triage vital signs and the nursing notes.  Pertinent labs & imaging results that were available during my care of the patient were reviewed by me and considered in my medical decision making (see chart for details).  Clinical Course    I personally performed the services described in this documentation, which was scribed in my presence. The  recorded information has been reviewed and is accurate.  Pt comes in with cc of chest pain. Chest pain is midsternal and radiating to the L side. Pain has been constant since the morning - almost 16-17 hours now. Pt is not exertional. Pain is not associated with food intake or with inspiration. Pt has no cough or dib. Unsure what the cause of the symptoms are. EKG and trop are neg for acute process.  We will get basic labs. Trop x 1 should be suffice given the constant pain since the morning. With normal appearing ekg, chest pain being atypical, no severe risk factors for ACS and HEAR score of 1, if trop is neg, we will d/c. Pt advised to see Cards immediately, and she agrees to do so. Strict ER return precautions have been discussed, and patient is agreeing with the plan and is comfortable with the  workup done and the recommendations from the ER.  Final Clinical Impressions(s) / ED Diagnoses   Final diagnoses:  Precordial pain    New Prescriptions New Prescriptions   No medications on file     Derwood Kaplan, MD 06/06/16 [phone]

## 2016-06-25 ENCOUNTER — Ambulatory Visit: Payer: Medicaid Other | Admitting: Certified Nurse Midwife

## 2016-08-02 ENCOUNTER — Emergency Department (HOSPITAL_COMMUNITY): Payer: Self-pay

## 2016-08-02 ENCOUNTER — Encounter (HOSPITAL_COMMUNITY): Payer: Self-pay | Admitting: Emergency Medicine

## 2016-08-02 ENCOUNTER — Emergency Department (HOSPITAL_COMMUNITY)
Admission: EM | Admit: 2016-08-02 | Discharge: 2016-08-02 | Disposition: A | Discharge status: Home / Self Care | Payer: Self-pay | Attending: Emergency Medicine | Admitting: Emergency Medicine

## 2016-08-02 DIAGNOSIS — B9789 Other viral agents as the cause of diseases classified elsewhere: Secondary | ICD-10-CM

## 2016-08-02 DIAGNOSIS — J069 Acute upper respiratory infection, unspecified: Secondary | ICD-10-CM | POA: Insufficient documentation

## 2016-08-02 LAB — RAPID STREP SCREEN (MED CTR MEBANE ONLY): STREPTOCOCCUS, GROUP A SCREEN (DIRECT): NEGATIVE

## 2016-08-02 MED ORDER — FLUTICASONE PROPIONATE 50 MCG/ACT NA SUSP: 1.0000 | Freq: Every day | NASAL | 0 refills | Status: DC

## 2016-08-02 NOTE — ED Triage Notes (Signed)
Pt c/o sore throat, ears itch and hurt, also coughing-- voice hoarse sounding-- had not had a fever per pt. States she has 4 kids, and 1 of them is sick with same .

## 2016-08-02 NOTE — Discharge Instructions (Signed)
Please read and follow all provided instructions.  Your diagnoses today include:  1. Viral URI with cough    You appear to have an upper respiratory infection (URI). An upper respiratory tract infection, or cold, is a viral infection of the air passages leading to the lungs. It should improve gradually after 5-7 days. You may have a lingering cough that lasts for 2- 4 weeks after the infection.  Tests performed today include: Vital signs. See below for your results today.   Medications prescribed:   Take any prescribed medications only as directed. Treatment for your infection is aimed at treating the symptoms. There are no medications, such as antibiotics, that will cure your infection.   Home care instructions:  Follow any educational materials contained in this packet.   Your illness is contagious and can be spread to others, especially during the first 3 or 4 days. It cannot be cured by antibiotics or other medicines. Take basic precautions such as washing your hands often, covering your mouth when you cough or sneeze, and avoiding public places where you could spread your illness to others.   Please continue drinking plenty of fluids.  Use over-the-counter medicines as needed as directed on packaging for symptom relief.  You may also use ibuprofen or tylenol as directed on packaging for pain or fever.  Do not take multiple medicines containing Tylenol or acetaminophen to avoid taking too much of this medication.  Follow-up instructions: Please follow-up with your primary care provider in the next 3 days for further evaluation of your symptoms if you are not feeling better.   Return instructions:  Please return to the Emergency Department if you experience worsening symptoms.  RETURN IMMEDIATELY IF you develop shortness of breath, confusion or altered mental status, a new rash, become dizzy, faint, or poorly responsive, or are unable to be cared for at home. Please return if you have  persistent vomiting and cannot keep down fluids or develop a fever that is not controlled by tylenol or motrin.   Please return if you have any other emergent concerns.  Additional Information:  Your vital signs today were: BP 106/78 (BP Location: Left Arm)    Pulse 78    Temp 99 F (37.2 C) (Oral)    Resp 14    Ht 5\' 7"  (1.702 m)    Wt 76.2 kg    LMP 07/05/2016    SpO2 99%    BMI 26.31 kg/m  If your blood pressure (BP) was elevated above 135/85 this visit, please have this repeated by your doctor within one month. --------------

## 2016-08-02 NOTE — ED Provider Notes (Signed)
[facility]EM[facility]Y DEPT Provider Note   CSN: [id number] Arrival date & time: 08/02/16  4098  By signing my name below, I, Phillis Haggis, attest that this documentation has been prepared under the direction and in the presence of Audry Pili, PA-C. Electronically Signed: Phillis Haggis, ED Scribe. 08/02/16. 9:53 AM.  History   Chief Complaint Chief Complaint  Patient presents with  . Sore Throat  . Otalgia   The history is provided by the patient. No language interpreter was used.  HPI Comments: Chelsea Castaneda is a 35 y.o. female who presents to the Emergency Department complaining of gradually worsening, burning, itching sore throat onset 5 days ago. Pt reports associated bilateral otalgia, mild headache, productive cough with sputum, and voice change. Pt reports worsening pain with eating and swallowing. Pt's husband has similar symptoms. She has not tried anything for her symptoms. She denies fever or trouble swallowing. No other symptoms noted.   Past Medical History:  Diagnosis Date  . Blood transfusion without reported diagnosis    no infusion reaction  . Tuberculosis    inactive    Patient Active Problem List   Diagnosis Date Noted  . S/P cesarean section 04/05/2015  . Unspecified vitamin D deficiency 10/27/2013    Past Surgical History:  Procedure Laterality Date  . CESAREAN SECTION N/A 04/04/2015   Procedure: CESAREAN SECTION;  Surgeon: Brock Bad, MD;  Location: [facility];  Service: Obstetrics;  Laterality: N/A;  . DILATION AND CURETTAGE OF UTERUS    . NOSE SURGERY      OB History    Gravida Para Term Preterm AB Living   4 4 4     4    SAB TAB Ectopic Multiple Live Births         0 4       Home Medications    Prior to Admission medications   Medication Sig Start Date End Date Taking? Authorizing Provider  traMADol (ULTRAM) 50 MG tablet Take 1 tablet (50 mg total) by mouth every 12 (twelve) hours as needed. 02/14/16   Tomasita Crumble, MD    Family  History Family History  Problem Relation Age of Onset  . Diabetes Mother   . Hypertension Mother     Social History Social History  Substance Use Topics  . Smoking status: Never Smoker  . Smokeless tobacco: Never Used  . Alcohol use No     Allergies   Review of patient's allergies indicates no known allergies.   Review of Systems Review of Systems  Constitutional: Negative for fever.  HENT: Positive for ear pain, sore throat and voice change. Negative for trouble swallowing.   Respiratory: Positive for cough.   Gastrointestinal: Negative for diarrhea and vomiting.  Neurological: Positive for headaches.   Physical Exam Updated Vital Signs BP 106/78 (BP Location: Left Arm)   Pulse 78   Temp 99 F (37.2 C) (Oral)   Resp 14   Ht 5\' 7"  (1.702 m)   Wt 168 lb (76.2 kg)   LMP 07/05/2016   SpO2 99%   BMI 26.31 kg/m   Physical Exam  Constitutional: She is oriented to person, place, and time. She appears well-developed and well-nourished. No distress.  Phonates well  HENT:  Head: Normocephalic and atraumatic.  Right Ear: Tympanic membrane, external ear and ear canal normal.  Left Ear: Tympanic membrane, external ear and ear canal normal.  Nose: Nose normal.  Mouth/Throat: Uvula is midline and mucous membranes are normal. No trismus in the jaw. No  uvula swelling. Posterior oropharyngeal erythema present. No oropharyngeal exudate or tonsillar abscesses.  No Trismus. No Uvula deviation. No Ludwigs. Full ROM of neck without pain.   Eyes: EOM are normal. Pupils are equal, round, and reactive to light.  Neck: Normal range of motion. Neck supple. No tracheal deviation present.  Cardiovascular: Normal rate, regular rhythm, S1 normal, S2 normal, normal heart sounds, intact distal pulses and normal pulses.   Pulmonary/Chest: Effort normal and breath sounds normal. No respiratory distress. She has no decreased breath sounds. She has no wheezes. She has no rhonchi. She has no rales.   Abdominal: Normal appearance and bowel sounds are normal. There is no tenderness.  Musculoskeletal: Normal range of motion.  Neurological: She is alert and oriented to person, place, and time.  Skin: Skin is warm and dry.  Psychiatric: She has a normal mood and affect. Her speech is normal and behavior is normal. Thought content normal.  Nursing note and vitals reviewed.  ED Treatments / Results  DIAGNOSTIC STUDIES: Oxygen Saturation is 99% on RA, normal by my interpretation.    COORDINATION OF CARE: 9:50 AM-Discussed treatment plan which includes strep screen and chest x-ray ith pt at bedside and pt agreed to plan.    Labs (all labs ordered are listed, but only abnormal results are displayed) Labs Reviewed  RAPID STREP SCREEN (NOT AT [facility])  CULTURE, GROUP A STREP [facility])    EKG  EKG Interpretation None       Radiology Dg Chest 2 View  Result Date: 08/02/2016 CLINICAL DATA:  35 year old female with history of coughing congestion for the past 5 days. Fever. EXAM: CHEST  2 VIEW COMPARISON:  Chest x-ray 06/06/2016. FINDINGS: Lung volumes are normal. No consolidative airspace disease. No pleural effusions. No pneumothorax. No pulmonary nodule or mass noted. Pulmonary vasculature and the cardiomediastinal silhouette are within normal limits. IMPRESSION: No radiographic evidence of acute cardiopulmonary disease. Electronically Signed   By: Trudie Reed M.D.   On: 08/02/2016 10:24    Procedures Procedures (including critical care time)  Medications Ordered in ED Medications - No data to display   Initial Impression / Assessment and Plan / ED Course  I have reviewed the triage vital signs and the nursing notes.  Pertinent labs & imaging results that were available during my care of the patient were reviewed by me and considered in my medical decision making (see chart for details).  Clinical Course    Final Clinical Impressions(s) / ED Diagnoses   I have reviewed and  evaluated the relevant laboratory values I have reviewed and evaluated the relevant imaging studies.  I have reviewed the relevant previous healthcare records.I obtained HPI from historian.  ED Course:  Assessment: Pt is a 35yF presents with URI symptoms x 5 days. Sick contacts at home. Cough and sore throat. On exam, pt in NAD. VSS. Afebrile. Lungs CTA, Heart RRR. Abdomen nontender/soft. Pt CXR negative for acute infiltrate. Strep negative. Patients symptoms are consistent with URI, likely viral etiology. Discussed that antibiotics are not indicated for viral infections. Pt will be discharged with symptomatic treatment.  Verbalizes understanding and is agreeable with plan. Pt is hemodynamically stable & in NAD prior to dc.  Disposition/Plan:  DC Home Additional Verbal discharge instructions given and discussed with patient.  Pt Instructed to f/u with PCP in the next week for evaluation and treatment of symptoms. Return precautions given Pt acknowledges and agrees with plan  Supervising Physician Jacalyn Lefevre, MD   Final diagnoses:  Viral  URI with cough   I personally performed the services described in this documentation, which was scribed in my presence. The recorded information has been reviewed and is accurate.   New Prescriptions New Prescriptions   No medications on file     Audry Piliyler Lorien Shingler, PA-C 08/02/16 1035    Jacalyn LefevreJulie Haviland, MD 08/02/16 1242

## 2016-08-02 NOTE — ED Notes (Signed)
Declined W/C at D/C and was escorted to lobby by RN. 

## 2016-08-05 LAB — CULTURE, GROUP A STREP (THRC)

## 2016-08-14 ENCOUNTER — Encounter (HOSPITAL_COMMUNITY): Payer: Self-pay | Admitting: Emergency Medicine

## 2016-08-14 ENCOUNTER — Emergency Department (HOSPITAL_COMMUNITY)
Admission: EM | Admit: 2016-08-14 | Discharge: 2016-08-14 | Disposition: A | Discharge status: Home / Self Care | Payer: Medicaid Other | Attending: Emergency Medicine | Admitting: Emergency Medicine

## 2016-08-14 DIAGNOSIS — M546 Pain in thoracic spine: Secondary | ICD-10-CM | POA: Insufficient documentation

## 2016-08-14 DIAGNOSIS — M549 Dorsalgia, unspecified: Secondary | ICD-10-CM

## 2016-08-14 MED ORDER — IBUPROFEN 600 MG PO TABS: 600.0000 mg | ORAL_TABLET | Freq: Four times a day (QID) | ORAL | 0 refills | Status: DC | PRN

## 2016-08-14 MED ORDER — IBUPROFEN 400 MG PO TABS
600.0000 mg | ORAL_TABLET | Freq: Once | ORAL | Status: AC
  Administered 2016-08-14: 600 mg via ORAL
  Filled 2016-08-14: qty 1

## 2016-08-14 MED ORDER — CYCLOBENZAPRINE HCL 10 MG PO TABS: 10.0000 mg | ORAL_TABLET | Freq: Every evening | ORAL | 0 refills | Status: DC | PRN

## 2016-08-14 NOTE — Discharge Instructions (Signed)
Take Ibuprofen 600mg  three times a day Take the muscle relaxer at bedtime. It makes you drowsy so do not drive after taking this medicine Use ice or heat on the area for 20 minutes at a time several times a day

## 2016-08-14 NOTE — ED Provider Notes (Signed)
[facility] Provider Note   CSN: [id number] Arrival date & time: 08/14/16  [id number]  By signing my name below, I, Soijett Blue, attest that this documentation has been prepared under the direction and in the presence of Bethel BornKelly Marie Jerian Morais, PA-C Electronically Signed: Soijett Blue, ED Scribe. 08/14/16. 10:28 AM.   History   Chief Complaint Chief Complaint  Patient presents with  . Back Pain    HPI Chelsea Castaneda is a 35 y.o. female who presents to the Emergency Department complaining of non-radiating right mid back pain onset last night. Pt reports she was painting the exterior of her house with her husband last night prior to the onset of her right mid back pain. Pt denies recent injury, fall, or trauma. Pt notes that her right mid back pain pain is worsened with movement, sitting, and deep breathing. Pt denies any alleviating factors for her right mid back pain. She notes that she has not tried any medications for the relief of her symptoms. She denies color change, wound, rash, numbness, tingling, weakness, swelling, bowel/bladder incontinence, and any other symptoms.    The history is provided by the patient. No language interpreter was used.    Past Medical History:  Diagnosis Date  . Blood transfusion without reported diagnosis    no infusion reaction  . Tuberculosis    inactive    Patient Active Problem List   Diagnosis Date Noted  . S/P cesarean section 04/05/2015  . Unspecified vitamin D deficiency 10/27/2013    Past Surgical History:  Procedure Laterality Date  . CESAREAN SECTION N/A 04/04/2015   Procedure: CESAREAN SECTION;  Surgeon: Brock Badharles A Harper, MD;  Location: [facility];  Service: Obstetrics;  Laterality: N/A;  . DILATION AND CURETTAGE OF UTERUS    . NOSE SURGERY      OB History    Gravida Para Term Preterm AB Living   4 4 4     4    SAB TAB Ectopic Multiple Live Births         0 4       Home Medications    Prior to Admission medications     Medication Sig Start Date End Date Taking? Authorizing Provider  fluticasone (FLONASE) 50 MCG/ACT nasal spray Place 1 spray into both nostrils daily. 08/02/16   Audry Piliyler Mohr, PA-C  traMADol (ULTRAM) 50 MG tablet Take 1 tablet (50 mg total) by mouth every 12 (twelve) hours as needed. 02/14/16   Tomasita CrumbleAdeleke Oni, MD    Family History Family History  Problem Relation Age of Onset  . Diabetes Mother   . Hypertension Mother     Social History Social History  Substance Use Topics  . Smoking status: Never Smoker  . Smokeless tobacco: Never Used  . Alcohol use No     Allergies   Review of patient's allergies indicates no known allergies.   Review of Systems Review of Systems  Gastrointestinal:       No bowel incontinence.  Genitourinary:       No bladder incontinence.  Musculoskeletal: Positive for back pain (right mid). Negative for arthralgias and joint swelling.  Skin: Negative for color change, rash and wound.  Neurological: Negative for weakness and numbness.       No tingling     Physical Exam Updated Vital Signs BP 110/78 (BP Location: Left Arm)   Pulse 68   Temp 97.9 F (36.6 C) (Oral)   Resp 20   Ht 5\' 6"  (1.676 m)   Wt  168 lb (76.2 kg)   LMP 07/05/2016   SpO2 100%   BMI 27.12 kg/m   Physical Exam  Constitutional: She is oriented to person, place, and time. She appears well-developed and well-nourished. No distress.  HENT:  Head: Normocephalic and atraumatic.  Eyes: EOM are normal.  Neck: Neck supple.  Cardiovascular: Normal rate.   Pulmonary/Chest: Effort normal. No respiratory distress.  Abdominal: She exhibits no distension.  Musculoskeletal: Normal range of motion.       Cervical back: Normal.       Thoracic back: She exhibits tenderness. She exhibits no bony tenderness.       Lumbar back: Normal.  No midline spinal tenderness. Right sided thoracic paraspinal muscle point tenderness. Ambulatory. 5/5 strength in LLE. 4/5 in RLE.   Neurological: She is  alert and oriented to person, place, and time. Gait normal.  Skin: Skin is warm and dry.  Psychiatric: She has a normal mood and affect. Her behavior is normal.  Nursing note and vitals reviewed.    ED Treatments / Results  DIAGNOSTIC STUDIES: Oxygen Saturation is 100% on RA, nl by my interpretation.    COORDINATION OF CARE: 10:24 AM Discussed treatment plan with pt at bedside which includes ice, heat, ibuprofen Rx, flexeril Rx, and pt agreed to plan.   Procedures Procedures (including critical care time)  Medications Ordered in ED Medications  ibuprofen (ADVIL,MOTRIN) tablet 600 mg (600 mg Oral Given 08/14/16 1030)     Initial Impression / Assessment and Plan / ED Course  I have reviewed the triage vital signs and the nursing notes.   Clinical Course   Patient with back pain.  No neurological deficits and normal neuro exam.  Patient is ambulatory.  No loss of bowel or bladder control.  No concern for cauda equina.  No fever, night sweats, weight loss, h/o cancer, IVDA, no recent procedure to back. No urinary symptoms suggestive of UTI. Will discharge the pt home with ibuprofen Rx and flexeril Rx. Supportive care and return precaution discussed. Appears safe for discharge at this time. Follow up as indicated in discharge paperwork.   Final Clinical Impressions(s) / ED Diagnoses   Final diagnoses:  Mid back pain on right side    New Prescriptions New Prescriptions   No medications on file    I personally performed the services described in this documentation, which was scribed in my presence. The recorded information has been reviewed and is accurate.     Bethel Born, PA-C 08/16/16 1423    Lyndal Pulley, MD 08/16/16 [phone]

## 2016-08-14 NOTE — ED Triage Notes (Signed)
Pt c/o right scapula pain since painting her house this week. Pain worse with movement.

## 2016-09-28 ENCOUNTER — Inpatient Hospital Stay (HOSPITAL_COMMUNITY)
Admission: AD | Admit: 2016-09-28 | Discharge: 2016-09-28 | Disposition: A | Discharge status: Home / Self Care | Payer: Medicaid Other | Source: Ambulatory Visit | Attending: Obstetrics and Gynecology | Admitting: Obstetrics and Gynecology

## 2016-09-28 ENCOUNTER — Encounter (HOSPITAL_COMMUNITY): Payer: Self-pay | Admitting: *Deleted

## 2016-09-28 DIAGNOSIS — Z975 Presence of (intrauterine) contraceptive device: Secondary | ICD-10-CM | POA: Diagnosis not present

## 2016-09-28 DIAGNOSIS — Z833 Family history of diabetes mellitus: Secondary | ICD-10-CM | POA: Diagnosis not present

## 2016-09-28 DIAGNOSIS — Z3202 Encounter for pregnancy test, result negative: Secondary | ICD-10-CM

## 2016-09-28 DIAGNOSIS — Z8249 Family history of ischemic heart disease and other diseases of the circulatory system: Secondary | ICD-10-CM | POA: Diagnosis not present

## 2016-09-28 DIAGNOSIS — N898 Other specified noninflammatory disorders of vagina: Secondary | ICD-10-CM | POA: Diagnosis present

## 2016-09-28 DIAGNOSIS — N390 Urinary tract infection, site not specified: Secondary | ICD-10-CM | POA: Insufficient documentation

## 2016-09-28 LAB — URINE MICROSCOPIC-ADD ON

## 2016-09-28 LAB — URINALYSIS, ROUTINE W REFLEX MICROSCOPIC
Bilirubin Urine: NEGATIVE
Glucose, UA: NEGATIVE mg/dL
Ketones, ur: NEGATIVE mg/dL
NITRITE: NEGATIVE
PH: 5 (ref 5.0–8.0)
Protein, ur: NEGATIVE mg/dL

## 2016-09-28 LAB — POCT PREGNANCY, URINE: Preg Test, Ur: NEGATIVE

## 2016-09-28 MED ORDER — SULFAMETHOXAZOLE-TRIMETHOPRIM 800-160 MG PO TABS: 1.0000 | ORAL_TABLET | Freq: Two times a day (BID) | ORAL | 0 refills | Status: DC

## 2016-09-28 NOTE — MAU Note (Signed)
Pt presents to MAU with complaints of lower abdominal pain with brown discharge. PT states that she has normal cycles IUD in place, but the beginning of November her cycle was only brown discharge. 2 negative pregnancy test at home

## 2016-09-28 NOTE — MAU Provider Note (Signed)
History     CSN: [id number]  Arrival date and time: 09/28/16 1319   First Provider Initiated Contact with Patient 09/28/16 1427      Chief Complaint  Patient presents with  . Vaginal Discharge  . Possible Pregnancy   HPI   [name] is a 35 y.o. female [phone] here with concerns about pregnancy. Patient has a paraguard IUD that was placed 1.5 years ago. She has regular periods.   LMP was at the first of NOV and it was normal.   She started noticing brown discharge on Saturday and now it is gone. This had her concerned that she may be pregnant because this is not normal for her to have brown discharge near her period.   OB History    Gravida Para Term Preterm AB Living   4 4 4     4    SAB TAB Ectopic Multiple Live Births         0 4      Past Medical History:  Diagnosis Date  . Blood transfusion without reported diagnosis    no infusion reaction  . Tuberculosis    inactive    Past Surgical History:  Procedure Laterality Date  . CESAREAN SECTION N/A 04/04/2015   Procedure: CESAREAN SECTION;  Surgeon: Chelsea Badharles A Harper, MD;  Location: [facility];  Service: Obstetrics;  Laterality: N/A;  . DILATION AND CURETTAGE OF UTERUS    . NOSE SURGERY      Family History  Problem Relation Age of Onset  . Diabetes Mother   . Hypertension Mother     Social History  Substance Use Topics  . Smoking status: Never Smoker  . Smokeless tobacco: Never Used  . Alcohol use No    Allergies: No Known Allergies  Prescriptions Prior to Admission  Medication Sig Dispense Refill Last Dose  . cyclobenzaprine (FLEXERIL) 10 MG tablet Take 1 tablet (10 mg total) by mouth at bedtime and may repeat dose one time if needed. 20 tablet 0   . fluticasone (FLONASE) 50 MCG/ACT nasal spray Place 1 spray into both nostrils daily. 16 g 0   . ibuprofen (ADVIL,MOTRIN) 600 MG tablet Take 1 tablet (600 mg total) by mouth every 6 (six) hours as needed. 21 tablet 0   . traMADol (ULTRAM) 50 MG  tablet Take 1 tablet (50 mg total) by mouth every 12 (twelve) hours as needed. 10 tablet 0    Results for orders placed or performed during the hospital encounter of 09/28/16 (from the past 48 hour(s))  Urinalysis, Routine w reflex microscopic (not at [facility])     Status: Abnormal   Collection Time: 09/28/16  1:35 PM  Result Value Ref Range   Color, Urine YELLOW YELLOW   APPearance CLEAR CLEAR   Specific Gravity, Urine >1.030 (H) 1.005 - 1.030   pH 5.0 5.0 - 8.0   Glucose, UA NEGATIVE NEGATIVE mg/dL   Hgb urine dipstick LARGE (A) NEGATIVE   Bilirubin Urine NEGATIVE NEGATIVE   Ketones, ur NEGATIVE NEGATIVE mg/dL   Protein, ur NEGATIVE NEGATIVE mg/dL   Nitrite NEGATIVE NEGATIVE   Leukocytes, UA SMALL (A) NEGATIVE  Urine microscopic-add on     Status: Abnormal   Collection Time: 09/28/16  1:35 PM  Result Value Ref Range   Squamous Epithelial / LPF 0-5 (A) NONE SEEN   WBC, UA 6-30 0 - 5 WBC/hpf   RBC / HPF 0-5 0 - 5 RBC/hpf   Bacteria, UA FEW (A) NONE SEEN  Pregnancy,  urine POC     Status: None   Collection Time: 09/28/16  2:19 PM  Result Value Ref Range   Preg Test, Ur NEGATIVE NEGATIVE    Comment:        THE SENSITIVITY OF THIS METHODOLOGY IS >24 mIU/mL     Review of Systems  Constitutional: Negative for fever.  Genitourinary: Positive for frequency and urgency. Negative for dysuria, flank pain and hematuria.   Physical Exam   Blood pressure 129/71, pulse 71, temperature 98.6 F (37 C), resp. rate 18, height 5\' 3"  (1.6 m), weight 160 lb (72.6 kg), last menstrual period 09/02/2016, not currently breastfeeding.  Physical Exam  Constitutional: She is oriented to person, place, and time. She appears well-developed and well-nourished. No distress.  HENT:  Head: Normocephalic.  Eyes: Pupils are equal, round, and reactive to light.  GI: There is tenderness in the suprapubic area. There is no rigidity and no guarding.  Genitourinary:  Genitourinary Comments: Vagina - Small amount  of brown/ pink vaginal discharge, no odor  Cervix - No contact bleeding, no active bleeding. IUD visualized; appears in appropriate position  Bimanual exam: Cervix closed, IUD palpated  Uterus non tender, normal size Adnexa non tender, no masses bilaterally. + suprapubic tenderness  Chaperone present for exam.   Musculoskeletal: Normal range of motion.  Neurological: She is alert and oriented to person, place, and time.  Skin: Skin is warm. She is not diaphoretic.  Psychiatric: Her behavior is normal.    MAU Course  Procedures None  MDM  UA   Assessment and Plan   A:  1. Encounter for pregnancy test, result negative   2. Acute UTI     P:  Discharge home in stable condition Spotting/discharge likely due to onset of menses.  Rx: Bactrim Return to MAU if symptoms worsen

## 2016-09-28 NOTE — Discharge Instructions (Signed)
Urinary Tract Infection, Adult Introduction A urinary tract infection (UTI) is an infection of any part of the urinary tract. The urinary tract includes the:  Kidneys.  Ureters.  Bladder.  Urethra. These organs make, store, and get rid of pee (urine) in the body. Follow these instructions at home:  Take over-the-counter and prescription medicines only as told by your doctor.  If you were prescribed an antibiotic medicine, take it as told by your doctor. Do not stop taking the antibiotic even if you start to feel better.  Avoid the following drinks:  Alcohol.  Caffeine.  Tea.  Carbonated drinks.  Drink enough fluid to keep your pee clear or pale yellow.  Keep all follow-up visits as told by your doctor. This is important.  Make sure to:  Empty your bladder often and completely. Do not to hold pee for long periods of time.  Empty your bladder before and after sex.  Wipe from front to back after a bowel movement if you are female. Use each tissue one time when you wipe. Contact a doctor if:  You have back pain.  You have a fever.  You feel sick to your stomach (nauseous).  You throw up (vomit).  Your symptoms do not get better after 3 days.  Your symptoms go away and then come back. Get help right away if:  You have very bad back pain.  You have very bad lower belly (abdominal) pain.  You are throwing up and cannot keep down any medicines or water. This information is not intended to replace advice given to you by your health care provider. Make sure you discuss any questions you have with your health care provider. Document Released: 04/06/2008 Document Revised: 03/26/2016 Document Reviewed: 09/09/2015  2017 [employer]  

## 2016-10-23 ENCOUNTER — Ambulatory Visit: Payer: Medicaid Other | Admitting: Certified Nurse Midwife

## 2016-12-02 ENCOUNTER — Emergency Department (HOSPITAL_COMMUNITY)
Admission: EM | Admit: 2016-12-02 | Discharge: 2016-12-02 | Disposition: A | Discharge status: Home / Self Care | Payer: Medicaid Other | Attending: Emergency Medicine | Admitting: Emergency Medicine

## 2016-12-02 ENCOUNTER — Emergency Department (HOSPITAL_COMMUNITY): Payer: Medicaid Other

## 2016-12-02 DIAGNOSIS — Z79899 Other long term (current) drug therapy: Secondary | ICD-10-CM | POA: Insufficient documentation

## 2016-12-02 DIAGNOSIS — R0789 Other chest pain: Secondary | ICD-10-CM | POA: Diagnosis present

## 2016-12-02 DIAGNOSIS — R300 Dysuria: Secondary | ICD-10-CM

## 2016-12-02 LAB — BASIC METABOLIC PANEL
Anion gap: 8 (ref 5–15)
BUN: 14 mg/dL (ref 6–20)
CALCIUM: 9.7 mg/dL (ref 8.9–10.3)
CO2: 26 mmol/L (ref 22–32)
CREATININE: 0.67 mg/dL (ref 0.44–1.00)
Chloride: 104 mmol/L (ref 101–111)
Glucose, Bld: 95 mg/dL (ref 65–99)
Potassium: 3.9 mmol/L (ref 3.5–5.1)
Sodium: 138 mmol/L (ref 135–145)

## 2016-12-02 LAB — D-DIMER, QUANTITATIVE (NOT AT ARMC)

## 2016-12-02 LAB — URINALYSIS, ROUTINE W REFLEX MICROSCOPIC
BILIRUBIN URINE: NEGATIVE
GLUCOSE, UA: NEGATIVE mg/dL
HGB URINE DIPSTICK: NEGATIVE
Ketones, ur: NEGATIVE mg/dL
NITRITE: NEGATIVE
PH: 6 (ref 5.0–8.0)
Protein, ur: NEGATIVE mg/dL
Specific Gravity, Urine: 1.025 (ref 1.005–1.030)

## 2016-12-02 LAB — CBC
HCT: 37.1 % (ref 36.0–46.0)
Hemoglobin: 12.2 g/dL (ref 12.0–15.0)
MCH: 29.8 pg (ref 26.0–34.0)
MCHC: 32.9 g/dL (ref 30.0–36.0)
MCV: 90.5 fL (ref 78.0–100.0)
PLATELETS: 234 10*3/uL (ref 150–400)
RBC: 4.1 MIL/uL (ref 3.87–5.11)
RDW: 14 % (ref 11.5–15.5)
WBC: 5.1 10*3/uL (ref 4.0–10.5)

## 2016-12-02 LAB — I-STAT TROPONIN, ED: Troponin i, poc: 0 ng/mL (ref 0.00–0.08)

## 2016-12-02 MED ORDER — CEPHALEXIN 500 MG PO CAPS: 500.0000 mg | ORAL_CAPSULE | Freq: Three times a day (TID) | ORAL | 0 refills | Status: DC

## 2016-12-02 MED ORDER — MELOXICAM 15 MG PO TABS: 15.0000 mg | ORAL_TABLET | Freq: Every day | ORAL | 0 refills | Status: DC

## 2016-12-02 MED ORDER — MELOXICAM 15 MG PO TABS
15.0000 mg | ORAL_TABLET | Freq: Every day | ORAL | Status: DC
  Administered 2016-12-02: 15 mg via ORAL
  Filled 2016-12-02: qty 1

## 2016-12-02 MED ORDER — METHOCARBAMOL 500 MG PO TABS: 500.0000 mg | ORAL_TABLET | Freq: Every evening | ORAL | 0 refills | Status: DC | PRN

## 2016-12-02 NOTE — ED Provider Notes (Signed)
[facility]-EMERGENCY DEPT Provider Note   CSN: [id number] Arrival date & time: 12/02/16  1607   History   Chief Complaint Chief Complaint  Patient presents with  . Chest Pain    HPI Chelsea Castaneda is a 36 y.o. female with no prior cardiac history presents with chest pain. Past medical history significant for musculoskeletal chest pain, chronic back pain. Yesterday she first noticed the pain in the left side of her chest. It was intermittent and mild in nature. Today when she woke up the pain was much more severe. It is constant and radiates to the left arm and neck. It feels like a pressure or tightness. Denies any injury or heavy lifting although she does have young children and picks them up. Moving her arm makes the pain worse. Pain is non-exertional. She reports associated lightheadedness and fatigue. She states that she has had this pain before. She denies any fever, chills, shortness of breath, cough, wheezing, diaphoresis, syncope, upper abdominal pain, nausea, vomiting. She has been evaluated in the ED multiple times for similar pain and it was believed to be musculoskeletal in nature. She did follow up with a cardiologist who also told her it was musculoskeletal. She has never had a stress test.  She also states that she has had suprapubic pain for several months now. She has never had this evaluated before. She reports some dysuria as as as well and would like to get checked for a UTI.  HPI  Past Medical History:  Diagnosis Date  . Blood transfusion without reported diagnosis    no infusion reaction  . Tuberculosis    inactive    Patient Active Problem List   Diagnosis Date Noted  . S/P cesarean section 04/05/2015  . Unspecified vitamin D deficiency 10/27/2013    Past Surgical History:  Procedure Laterality Date  . CESAREAN SECTION N/A 04/04/2015   Procedure: CESAREAN SECTION;  Surgeon: Brock Bad, MD;  Location: [facility];  Service: Obstetrics;  Laterality: N/A;  .  DILATION AND CURETTAGE OF UTERUS    . NOSE SURGERY      OB History    Gravida Para Term Preterm AB Living   4 4 4     4    SAB TAB Ectopic Multiple Live Births         0 4       Home Medications    Prior to Admission medications   Medication Sig Start Date End Date Taking? Authorizing Provider  cyclobenzaprine (FLEXERIL) 10 MG tablet Take 1 tablet (10 mg total) by mouth at bedtime and may repeat dose one time if needed. 08/14/16   Bethel Born, PA-C  fluticasone (FLONASE) 50 MCG/ACT nasal spray Place 1 spray into both nostrils daily. 08/02/16   Audry Pili, PA-C  ibuprofen (ADVIL,MOTRIN) 600 MG tablet Take 1 tablet (600 mg total) by mouth every 6 (six) hours as needed. 08/14/16   Bethel Born, PA-C  sulfamethoxazole-trimethoprim (BACTRIM DS,SEPTRA DS) 800-160 MG tablet Take 1 tablet by mouth 2 (two) times daily. 09/28/16   Duane Lope, NP  traMADol (ULTRAM) 50 MG tablet Take 1 tablet (50 mg total) by mouth every 12 (twelve) hours as needed. 02/14/16   Tomasita Crumble, MD    Family History Family History  Problem Relation Age of Onset  . Diabetes Mother   . Hypertension Mother     Social History Social History  Substance Use Topics  . Smoking status: Never Smoker  . Smokeless tobacco: Never Used  .  Alcohol use No     Allergies   Patient has no known allergies.   Review of Systems Review of Systems  Constitutional: Positive for fatigue. Negative for chills and fever.  Respiratory: Negative for cough, shortness of breath and wheezing.   Cardiovascular: Positive for chest pain. Negative for palpitations and leg swelling.  Gastrointestinal: Negative for abdominal pain, nausea and vomiting.  Genitourinary: Positive for dysuria.  Neurological: Positive for light-headedness. Negative for syncope.  All other systems reviewed and are negative.    Physical Exam Updated Vital Signs BP 126/90 (BP Location: Left Arm)   Pulse 75   Temp 98.1 F (36.7 C) (Oral)    Resp 15   SpO2 100%   Physical Exam  Constitutional: She is oriented to person, place, and time. She appears well-developed and well-nourished. No distress.  NAD, pleasant  HENT:  Head: Normocephalic and atraumatic.  Eyes: Conjunctivae are normal. Pupils are equal, round, and reactive to light. Right eye exhibits no discharge. Left eye exhibits no discharge. No scleral icterus.  Neck: Normal range of motion.  Cardiovascular: Normal rate and regular rhythm.  Exam reveals no gallop and no friction rub.   No murmur heard. Pulmonary/Chest: Effort normal and breath sounds normal. No respiratory distress. She has no wheezes. She has no rales. She exhibits no tenderness.  Abdominal: Soft. Bowel sounds are normal. She exhibits no distension and no mass. There is tenderness. There is no rebound and no guarding. No hernia.  Suprapubic tenderness  Neurological: She is alert and oriented to person, place, and time.  Skin: Skin is warm and dry.  Psychiatric: She has a normal mood and affect. Her behavior is normal.  Nursing note and vitals reviewed.    ED Treatments / Results  Labs (all labs ordered are listed, but only abnormal results are displayed) Labs Reviewed  URINALYSIS, ROUTINE W REFLEX MICROSCOPIC - Abnormal; Notable for the following:       Result Value   APPearance HAZY (*)    Leukocytes, UA LARGE (*)    Bacteria, UA RARE (*)    Squamous Epithelial / LPF 6-30 (*)    All other components within normal limits  BASIC METABOLIC PANEL  CBC  D-DIMER, QUANTITATIVE (NOT AT [facility])  I-STAT TROPOININ, ED    EKG  EKG Interpretation  Date/Time:  Wednesday December 02 2016 16:13:34 EST Ventricular Rate:  78 PR Interval:  140 QRS Duration: 86 QT Interval:  370 QTC Calculation: 421 R Axis:   87 Text Interpretation:  Normal sinus rhythm Cannot rule out Inferior infarct , age undetermined Abnormal ECG borderline ST depression in III and AVF more exaggerated than previous S1Q3T3 pattern  Confirmed by LITTLE MD, RACHEL [phone]) on 12/02/2016 6:36:39 PM       Radiology Dg Chest 2 View  Result Date: 12/02/2016 CLINICAL DATA:  36 y/o  F; chest pain. EXAM: CHEST  2 VIEW COMPARISON:  08/02/2016 chest radiograph FINDINGS: The heart size and mediastinal contours are within normal limits and stable. Both lungs are clear. The visualized skeletal structures are unremarkable. IMPRESSION: No active cardiopulmonary disease. Electronically Signed   By: Mitzi Hansen M.D.   On: 12/02/2016 18:52    Procedures Procedures (including critical care time)  Medications Ordered in ED Medications  meloxicam (MOBIC) tablet 15 mg (15 mg Oral Given 12/02/16 2054)     Initial Impression / Assessment and Plan / ED Course  I have reviewed the triage vital signs and the nursing notes.  Pertinent labs &  imaging results that were available during my care of the patient were reviewed by me and considered in my medical decision making (see chart for details).  36 year old female presents with chest pain most likely MSK in nature. Chest pain work up is reassuring. EKG is NSR abut does show new S1Q3T3 pattern. D-dimer is negative. CXR is negative. Troponin is 0. Labs are unremarkable. Will tx with NSAIDs and muscle relaxers. Advised follow up with PCP.  UA shows rare bacteria, large leukocytes, 6-30 WBCs but also appears contaminated. Due to symptoms will treat with Keflex for 5 days. Advised PCP follow up. Patient is NAD, non-toxic, with stable VS. Patient is informed of clinical course, understands medical decision making process, and agrees with plan. Opportunity for questions provided and all questions answered. Return precautions given.   Final Clinical Impressions(s) / ED Diagnoses   Final diagnoses:  Chest wall pain  Dysuria    New Prescriptions Discharge Medication List as of 12/02/2016 10:31 PM    START taking these medications   Details  cephALEXin (KEFLEX) 500 MG capsule Take 1  capsule (500 mg total) by mouth 3 (three) times daily., Starting Wed 12/02/2016, Print    meloxicam (MOBIC) 15 MG tablet Take 1 tablet (15 mg total) by mouth daily., Starting Wed 12/02/2016, Print    methocarbamol (ROBAXIN) 500 MG tablet Take 1 tablet (500 mg total) by mouth at bedtime and may repeat dose one time if needed., Starting Wed 12/02/2016, Print         Bethel BornKelly Marie Gekas, PA-C 12/03/16 0016    Laurence Spatesachel Morgan Little, MD 12/03/16 [phone]

## 2016-12-02 NOTE — Discharge Instructions (Signed)
Please take pain medicine and muscle relaxer for chest pain Take antibiotic for urinary pain Return for worsening symptoms

## 2016-12-02 NOTE — ED Notes (Signed)
Patient brought back to room from Radiology department; patient undressed, in gown, on monitor, continuous pulse oximetry and blood pressure cuff; warm blanket given; visitor at bedside

## 2016-12-02 NOTE — ED Triage Notes (Signed)
Pt complaining of L sided sharp chest pain since this morning. Pt denies any cough or SOB. Pt denies any fevers/chills. Pt denies any N/V/D.

## 2016-12-02 NOTE — ED Notes (Signed)
Patient called in main ED waiting room area with no response 

## 2017-03-14 ENCOUNTER — Encounter (HOSPITAL_COMMUNITY): Payer: Self-pay

## 2017-03-14 ENCOUNTER — Inpatient Hospital Stay (HOSPITAL_COMMUNITY)
Admission: AD | Admit: 2017-03-14 | Discharge: 2017-03-14 | Disposition: A | Discharge status: Home / Self Care | Payer: Medicaid Other | Source: Ambulatory Visit | Attending: Obstetrics & Gynecology | Admitting: Obstetrics & Gynecology

## 2017-03-14 DIAGNOSIS — R1031 Right lower quadrant pain: Secondary | ICD-10-CM | POA: Insufficient documentation

## 2017-03-14 DIAGNOSIS — Z3202 Encounter for pregnancy test, result negative: Secondary | ICD-10-CM | POA: Insufficient documentation

## 2017-03-14 DIAGNOSIS — Z975 Presence of (intrauterine) contraceptive device: Secondary | ICD-10-CM | POA: Insufficient documentation

## 2017-03-14 DIAGNOSIS — R5383 Other fatigue: Secondary | ICD-10-CM | POA: Insufficient documentation

## 2017-03-14 LAB — URINALYSIS, ROUTINE W REFLEX MICROSCOPIC
BILIRUBIN URINE: NEGATIVE
Bacteria, UA: NONE SEEN
GLUCOSE, UA: NEGATIVE mg/dL
HGB URINE DIPSTICK: NEGATIVE
Ketones, ur: NEGATIVE mg/dL
NITRITE: NEGATIVE
PROTEIN: NEGATIVE mg/dL
Specific Gravity, Urine: 1.029 (ref 1.005–1.030)
pH: 6 (ref 5.0–8.0)

## 2017-03-14 LAB — CBC
HCT: 38.6 % (ref 36.0–46.0)
Hemoglobin: 12.9 g/dL (ref 12.0–15.0)
MCH: 30.2 pg (ref 26.0–34.0)
MCHC: 33.4 g/dL (ref 30.0–36.0)
MCV: 90.4 fL (ref 78.0–100.0)
Platelets: 218 10*3/uL (ref 150–400)
RBC: 4.27 MIL/uL (ref 3.87–5.11)
RDW: 13.8 % (ref 11.5–15.5)
WBC: 5.1 10*3/uL (ref 4.0–10.5)

## 2017-03-14 LAB — POCT PREGNANCY, URINE: Preg Test, Ur: NEGATIVE

## 2017-03-14 LAB — HCG, QUANTITATIVE, PREGNANCY: hCG, Beta Chain, Quant, S: <1 m[IU]/mL (ref <5)

## 2017-03-14 NOTE — MAU Provider Note (Signed)
Chief Complaint: Abdominal Pain   First Provider Initiated Contact with Patient 03/14/17 2044        SUBJECTIVE HPI: Chelsea Castaneda is a 36 y.o. [id number] at Unknown by LMP who presents to maternity admissions reporting right lower quadrant pain off an on for months.  Thinks it is her bladder.  Had a pregnancy test that was negative but after it sat for a while it looked positive.  Her period is late by 5 days, so she is worried about pregnancy.  C/O fatigue.  Has had right lower quadrant pain ever since her IUD was placed 3 years ago.  Declines STD testing. She denies vaginal bleeding, vaginal itching/burning, urinary symptoms, h/a, dizziness, n/v, or fever/chills.    Abdominal Pain  This is a recurrent problem. The current episode started more than 1 year ago. The onset quality is gradual. The problem occurs intermittently. The problem has been unchanged. The pain is located in the RLQ and suprapubic region. The pain is mild. The quality of the pain is aching and cramping. The abdominal pain does not radiate. Pertinent negatives include no constipation, diarrhea, dysuria, fever, frequency, myalgias, nausea or vomiting.   RN Note: Pt states her last period was on 02/03/17. Pt states of feeling fatigued. Pt states she has an IUD that was placed three years ago. Pt took a pregnancy test at home that was positive. Pt c/o right lower abdominal pain that started yesterday. Pt denies bleeding. Pt c/o increased discharge  Past Medical History:  Diagnosis Date  . Blood transfusion without reported diagnosis    no infusion reaction  . Tuberculosis    inactive   Past Surgical History:  Procedure Laterality Date  . CESAREAN SECTION N/A 04/04/2015   Procedure: CESAREAN SECTION;  Surgeon: Brock Bad, MD;  Location: [facility];  Service: Obstetrics;  Laterality: N/A;  . DILATION AND CURETTAGE OF UTERUS    . NOSE SURGERY     Social History   Social History  . Marital status: Married    Spouse  name: N/A  . Number of children: N/A  . Years of education: N/A   Occupational History  . Not on file.   Social History Main Topics  . Smoking status: Never Smoker  . Smokeless tobacco: Never Used  . Alcohol use No  . Drug use: No  . Sexual activity: Not Currently    Birth control/ protection: None, IUD     Comment: paraguard   Other Topics Concern  . Not on file   Social History Narrative  . No narrative on file   No current facility-administered medications on file prior to encounter.    Current Outpatient Prescriptions on File Prior to Encounter  Medication Sig Dispense Refill  . cephALEXin (KEFLEX) 500 MG capsule Take 1 capsule (500 mg total) by mouth 3 (three) times daily. 15 capsule 0  . cyclobenzaprine (FLEXERIL) 10 MG tablet Take 1 tablet (10 mg total) by mouth at bedtime and may repeat dose one time if needed. 20 tablet 0  . fluticasone (FLONASE) 50 MCG/ACT nasal spray Place 1 spray into both nostrils daily. 16 g 0  . ibuprofen (ADVIL,MOTRIN) 600 MG tablet Take 1 tablet (600 mg total) by mouth every 6 (six) hours as needed. 21 tablet 0  . meloxicam (MOBIC) 15 MG tablet Take 1 tablet (15 mg total) by mouth daily. 10 tablet 0  . methocarbamol (ROBAXIN) 500 MG tablet Take 1 tablet (500 mg total) by mouth at bedtime and may  repeat dose one time if needed. 10 tablet 0  . sulfamethoxazole-trimethoprim (BACTRIM DS,SEPTRA DS) 800-160 MG tablet Take 1 tablet by mouth 2 (two) times daily. 6 tablet 0  . traMADol (ULTRAM) 50 MG tablet Take 1 tablet (50 mg total) by mouth every 12 (twelve) hours as needed. 10 tablet 0  . [DISCONTINUED] gabapentin (NEURONTIN) 100 MG capsule Take 1 capsule (100 mg total) by mouth 3 (three) times daily. 30 capsule 0   No Known Allergies  I have reviewed patient's Past Medical Hx, Surgical Hx, Family Hx, Social Hx, medications and allergies.   ROS:  Review of Systems  Constitutional: Negative for chills, fatigue and fever.  Respiratory: Negative  for shortness of breath.   Cardiovascular: Negative for chest pain and leg swelling.  Gastrointestinal: Positive for abdominal pain. Negative for constipation, diarrhea, nausea and vomiting.  Genitourinary: Positive for menstrual problem, pelvic pain and vaginal discharge. Negative for difficulty urinating, dysuria, frequency, vaginal bleeding and vaginal pain.  Musculoskeletal: Negative for back pain and myalgias.   Review of Systems  Other systems negative   Physical Exam  Physical Exam Patient Vitals for the past 24 hrs:  BP Temp Temp src Pulse Resp SpO2 Height Weight  03/14/17 2035 119/82 98.2 F (36.8 C) Oral 78 17 99 % 5\' 8"  (1.727 m) 168 lb 12 oz (76.5 kg)   Constitutional: Well-developed, well-nourished female in no acute distress.  Cardiovascular: normal rate Respiratory: normal effort GI: Abd soft, non-tender. Pos BS x 4 MS: Extremities nontender, no edema, normal ROM Neurologic: Alert and oriented x 4.  GU: Neg CVAT.  PELVIC EXAM: Cervix pink, visually closed, without lesion, scant white creamy discharge, vaginal walls and external genitalia normal    IUD strings visible x 2 Bimanual exam: Cervix 0/long/high, firm, anterior, neg CMT, uterus nontender, nonenlarged, adnexa without tenderness, enlargement, or mass   LAB RESULTS Results for orders placed or performed during the hospital encounter of 03/14/17 (from the past 24 hour(s))  Urinalysis, Routine w reflex microscopic     Status: Abnormal   Collection Time: 03/14/17  8:25 PM  Result Value Ref Range   Color, Urine YELLOW YELLOW   APPearance CLEAR CLEAR   Specific Gravity, Urine 1.029 1.005 - 1.030   pH 6.0 5.0 - 8.0   Glucose, UA NEGATIVE NEGATIVE mg/dL   Hgb urine dipstick NEGATIVE NEGATIVE   Bilirubin Urine NEGATIVE NEGATIVE   Ketones, ur NEGATIVE NEGATIVE mg/dL   Protein, ur NEGATIVE NEGATIVE mg/dL   Nitrite NEGATIVE NEGATIVE   Leukocytes, UA TRACE (A) NEGATIVE   RBC / HPF 0-5 0 - 5 RBC/hpf   WBC, UA 0-5  0 - 5 WBC/hpf   Bacteria, UA NONE SEEN NONE SEEN   Squamous Epithelial / LPF 6-30 (A) NONE SEEN   Mucous PRESENT   Pregnancy, urine POC     Status: None   Collection Time: 03/14/17  8:34 PM  Result Value Ref Range   Preg Test, Ur NEGATIVE NEGATIVE  hCG, quantitative, pregnancy     Status: None   Collection Time: 03/14/17  8:45 PM  Result Value Ref Range   hCG, Beta Chain, Quant, S <1 <5 mIU/mL  CBC     Status: None   Collection Time: 03/14/17  8:45 PM  Result Value Ref Range   WBC 5.1 4.0 - 10.5 K/uL   RBC 4.27 3.87 - 5.11 MIL/uL   Hemoglobin 12.9 12.0 - 15.0 g/dL   HCT 16.1 09.6 - 04.5 %   MCV 90.4 78.0 -  100.0 fL   MCH 30.2 26.0 - 34.0 pg   MCHC 33.4 30.0 - 36.0 g/dL   RDW 16.113.8 09.611.5 - 04.515.5 %   Platelets 218 150 - 400 K/uL       IMAGING No results found.  MAU Management/MDM: Ordered CBC to help rule out appendicitis.  Exam is benign with neg Murphy sign. WBC is normal IUD strings visible Will order outpatient US to evaluate right lower abdominal pain Discussed cannot identify source of pain, but doubtful it is her IUD or bladder.   ASSESSMENT Right lower quadrant pain IUD in place Negative serum pregnancy test  PLAN Discharge home Will order outpatient US to evaluate RLQ pain  Pt stable at time of discharge. Encouraged to return here or to other Urgent Care/ED if she develops worsening of symptoms, increase in pain, fever, or other concerning symptoms.    Wynelle BourgeoisMarie Williams CNM, MSN Certified Nurse-Midwife 03/14/2017  8:44 PM

## 2017-03-14 NOTE — Discharge Instructions (Signed)

## 2017-03-14 NOTE — MAU Note (Signed)
Pt states her last period was on 02/03/17. Pt states of feeling fatigued. Pt states she has an IUD that was placed three years ago. Pt took a pregnancy test at home that was positive. Pt c/o right lower abdominal pain that started yesterday. Pt denies bleeding. Pt c/o increased discharge.

## 2017-08-24 ENCOUNTER — Inpatient Hospital Stay (HOSPITAL_COMMUNITY)
Admission: AD | Admit: 2017-08-24 | Discharge: 2017-08-24 | Disposition: A | Discharge status: Home / Self Care | Payer: Self-pay | Source: Ambulatory Visit | Attending: Family Medicine | Admitting: Family Medicine

## 2017-08-24 ENCOUNTER — Encounter (HOSPITAL_COMMUNITY): Payer: Self-pay | Admitting: *Deleted

## 2017-08-24 DIAGNOSIS — Z3202 Encounter for pregnancy test, result negative: Secondary | ICD-10-CM | POA: Insufficient documentation

## 2017-08-24 DIAGNOSIS — R350 Frequency of micturition: Secondary | ICD-10-CM | POA: Insufficient documentation

## 2017-08-24 DIAGNOSIS — R1032 Left lower quadrant pain: Secondary | ICD-10-CM | POA: Insufficient documentation

## 2017-08-24 DIAGNOSIS — R1030 Lower abdominal pain, unspecified: Secondary | ICD-10-CM

## 2017-08-24 LAB — URINALYSIS, ROUTINE W REFLEX MICROSCOPIC
Bilirubin Urine: NEGATIVE
Glucose, UA: NEGATIVE mg/dL
Hgb urine dipstick: NEGATIVE
Ketones, ur: NEGATIVE mg/dL
Nitrite: NEGATIVE
PROTEIN: NEGATIVE mg/dL
SPECIFIC GRAVITY, URINE: 1.024 (ref 1.005–1.030)
pH: 5 (ref 5.0–8.0)

## 2017-08-24 LAB — CBC
HEMATOCRIT: 37.5 % (ref 36.0–46.0)
HEMOGLOBIN: 12.5 g/dL (ref 12.0–15.0)
MCH: 30.1 pg (ref 26.0–34.0)
MCHC: 33.3 g/dL (ref 30.0–36.0)
MCV: 90.4 fL (ref 78.0–100.0)
Platelets: 222 10*3/uL (ref 150–400)
RBC: 4.15 MIL/uL (ref 3.87–5.11)
RDW: 14.2 % (ref 11.5–15.5)
WBC: 5.7 10*3/uL (ref 4.0–10.5)

## 2017-08-24 LAB — WET PREP, GENITAL
CLUE CELLS WET PREP: NONE SEEN
SPERM: NONE SEEN
TRICH WET PREP: NONE SEEN
YEAST WET PREP: NONE SEEN

## 2017-08-24 LAB — POCT PREGNANCY, URINE: PREG TEST UR: NEGATIVE

## 2017-08-24 NOTE — MAU Note (Addendum)
Pt reports lower abd cramping for the past 19 days followed after her cycle, followed by 4 days of sharp pain only on the lower left abd. Also reports a strong urgency with frequency over the past 19 days. Denies any burning with urination.

## 2017-08-24 NOTE — MAU Provider Note (Signed)
History     CSN: [id number]  Arrival date and time: 08/24/17 2042  Chief Complaint  Patient presents with  . Abdominal Pain   36 y.o. non-pregnant female here with LAP. Sx started 19 days ago after her menses ended. Describes as menstrual type cramping. She then developed a sharp pain in LLQ about 4 days ago. She did not take anything for the pain. Reports polyuria and urgency x2 weeks. Denies other urinary sx. No vaginal discharge. No fevers. She is using Paragard for contraception.    Past Medical History:  Diagnosis Date  . Blood transfusion without reported diagnosis    no infusion reaction  . Tuberculosis    inactive    Past Surgical History:  Procedure Laterality Date  . CESAREAN SECTION N/A 04/04/2015   Procedure: CESAREAN SECTION;  Surgeon: Brock Bad, MD;  Location: [facility];  Service: Obstetrics;  Laterality: N/A;  . DILATION AND CURETTAGE OF UTERUS    . NOSE SURGERY      Family History  Problem Relation Age of Onset  . Diabetes Mother   . Hypertension Mother     Social History  Substance Use Topics  . Smoking status: Never Smoker  . Smokeless tobacco: Never Used  . Alcohol use No    Allergies: No Known Allergies  Prescriptions Prior to Admission  Medication Sig Dispense Refill Last Dose  . cephALEXin (KEFLEX) 500 MG capsule Take 1 capsule (500 mg total) by mouth 3 (three) times daily. 15 capsule 0 More than a month at Unknown time  . cyclobenzaprine (FLEXERIL) 10 MG tablet Take 1 tablet (10 mg total) by mouth at bedtime and may repeat dose one time if needed. 20 tablet 0 More than a month at Unknown time  . fluticasone (FLONASE) 50 MCG/ACT nasal spray Place 1 spray into both nostrils daily. 16 g 0 More than a month at Unknown time  . ibuprofen (ADVIL,MOTRIN) 600 MG tablet Take 1 tablet (600 mg total) by mouth every 6 (six) hours as needed. 21 tablet 0 More than a month at Unknown time  . meloxicam (MOBIC) 15 MG tablet Take 1 tablet (15 mg total) by  mouth daily. 10 tablet 0 More than a month at Unknown time  . methocarbamol (ROBAXIN) 500 MG tablet Take 1 tablet (500 mg total) by mouth at bedtime and may repeat dose one time if needed. 10 tablet 0 More than a month at Unknown time  . sulfamethoxazole-trimethoprim (BACTRIM DS,SEPTRA DS) 800-160 MG tablet Take 1 tablet by mouth 2 (two) times daily. 6 tablet 0 More than a month at Unknown time  . traMADol (ULTRAM) 50 MG tablet Take 1 tablet (50 mg total) by mouth every 12 (twelve) hours as needed. 10 tablet 0 More than a month at Unknown time    Review of Systems  Constitutional: Negative for chills and fever.  Gastrointestinal: Positive for abdominal pain. Negative for constipation, diarrhea, nausea and vomiting.  Genitourinary: Positive for frequency and urgency. Negative for dysuria, hematuria, vaginal bleeding and vaginal discharge.   Physical Exam   Blood pressure 125/70, pulse 77, resp. rate 16, height 5\' 4"  (1.626 m), weight 170 lb (77.1 kg), last menstrual period 08/01/2017, SpO2 99 %.  Physical Exam  Nursing note and vitals reviewed. Constitutional: She is oriented to person, place, and time. She appears well-developed and well-nourished. No distress.  HENT:  Head: Normocephalic and atraumatic.  Neck: Normal range of motion.  Cardiovascular: Normal rate.   Respiratory: Effort normal. No respiratory distress.  GI: Soft. She exhibits no distension and no mass. There is tenderness (LLQ). There is no rebound and no guarding.  Genitourinary:  Genitourinary Comments: External: no lesions or erythema Vagina: rugated, pink, moist, small thin white discharge, IUD strings seen Uterus: non enlarged, anteverted, non tender, no CMT Adnexae: no masses, no tenderness left, no tenderness right   Musculoskeletal: Normal range of motion.  Neurological: She is alert and oriented to person, place, and time.  Skin: Skin is warm and dry.  Psychiatric: She has a normal mood and affect.    Results for orders placed or performed during the hospital encounter of 08/24/17 (from the past 24 hour(s))  Urinalysis, Routine w reflex microscopic     Status: Abnormal   Collection Time: 08/24/17  8:56 PM  Result Value Ref Range   Color, Urine YELLOW YELLOW   APPearance CLEAR CLEAR   Specific Gravity, Urine 1.024 1.005 - 1.030   pH 5.0 5.0 - 8.0   Glucose, UA NEGATIVE NEGATIVE mg/dL   Hgb urine dipstick NEGATIVE NEGATIVE   Bilirubin Urine NEGATIVE NEGATIVE   Ketones, ur NEGATIVE NEGATIVE mg/dL   Protein, ur NEGATIVE NEGATIVE mg/dL   Nitrite NEGATIVE NEGATIVE   Leukocytes, UA SMALL (A) NEGATIVE   RBC / HPF 0-5 0 - 5 RBC/hpf   WBC, UA 0-5 0 - 5 WBC/hpf   Bacteria, UA RARE (A) NONE SEEN   Squamous Epithelial / LPF 6-30 (A) NONE SEEN   Mucus PRESENT   Pregnancy, urine POC     Status: None   Collection Time: 08/24/17  9:05 PM  Result Value Ref Range   Preg Test, Ur NEGATIVE NEGATIVE  CBC     Status: None   Collection Time: 08/24/17  9:28 PM  Result Value Ref Range   WBC 5.7 4.0 - 10.5 K/uL   RBC 4.15 3.87 - 5.11 MIL/uL   Hemoglobin 12.5 12.0 - 15.0 g/dL   HCT 16.137.5 09.636.0 - 04.546.0 %   MCV 90.4 78.0 - 100.0 fL   MCH 30.1 26.0 - 34.0 pg   MCHC 33.3 30.0 - 36.0 g/dL   RDW 40.914.2 81.111.5 - 91.415.5 %   Platelets 222 150 - 400 K/uL  Wet prep, genital     Status: Abnormal   Collection Time: 08/24/17  9:30 PM  Result Value Ref Range   Yeast Wet Prep HPF POC NONE SEEN NONE SEEN   Trich, Wet Prep NONE SEEN NONE SEEN   Clue Cells Wet Prep HPF POC NONE SEEN NONE SEEN   WBC, Wet Prep HPF POC MANY (A) NONE SEEN   Sperm NONE SEEN    MAU Course  Procedures  MDM Labs ordered and reviewed. Declines pain meds. No evidence of acute abdominal or pelvic process. No evidence of UTI but will send UC since symptomatic. Stable for discharge. Encouraged to establish primary care for f/u.  Assessment and Plan   1. Lower abdominal pain   2. Urinary frequency    Discharge home Follow up with PCP as  needed Ibuprofen prn  Allergies as of 08/24/2017   No Known Allergies     Medication List    STOP taking these medications   cephALEXin 500 MG capsule Commonly known as:  KEFLEX   cyclobenzaprine 10 MG tablet Commonly known as:  FLEXERIL   sulfamethoxazole-trimethoprim 800-160 MG tablet Commonly known as:  BACTRIM DS,SEPTRA DS     TAKE these medications   fluticasone 50 MCG/ACT nasal spray Commonly known as:  FLONASE Place 1 spray  into both nostrils daily.   ibuprofen 600 MG tablet Commonly known as:  ADVIL,MOTRIN Take 1 tablet (600 mg total) by mouth every 6 (six) hours as needed.   meloxicam 15 MG tablet Commonly known as:  MOBIC Take 1 tablet (15 mg total) by mouth daily.   methocarbamol 500 MG tablet Commonly known as:  ROBAXIN Take 1 tablet (500 mg total) by mouth at bedtime and may repeat dose one time if needed.   traMADol 50 MG tablet Commonly known as:  ULTRAM Take 1 tablet (50 mg total) by mouth every 12 (twelve) hours as needed.      Donette Larry, CNM 08/24/2017, 10:02 PM

## 2017-08-24 NOTE — Discharge Instructions (Signed)
Abdominal Pain, Adult Abdominal pain can be caused by many things. Often, abdominal pain is not serious and it gets better with no treatment or by being treated at home. However, sometimes abdominal pain is serious. Your health care provider will do a medical history and a physical exam to try to determine the cause of your abdominal pain. Follow these instructions at home:  Take over-the-counter and prescription medicines only as told by your health care provider. Do not take a laxative unless told by your health care provider.  Drink enough fluid to keep your urine clear or pale yellow.  Watch your condition for any changes.  Keep all follow-up visits as told by your health care provider. This is important. Contact a health care provider if:  Your abdominal pain changes or gets worse.  You are not hungry or you lose weight without trying.  You are constipated or have diarrhea for more than 2-3 days.  You have pain when you urinate or have a bowel movement.  Your abdominal pain wakes you up at night.  Your pain gets worse with meals, after eating, or with certain foods.  You are throwing up and cannot keep anything down.  You have a fever. Get help right away if:  Your pain does not go away as soon as your health care provider told you to expect.  You cannot stop throwing up.  Your pain is only in areas of the abdomen, such as the right side or the left lower portion of the abdomen.  You have bloody or black stools, or stools that look like tar.  You have severe pain, cramping, or bloating in your abdomen.  You have signs of dehydration, such as: ? Dark urine, very little urine, or no urine. ? Cracked lips. ? Dry mouth. ? Sunken eyes. ? Sleepiness. ? Weakness. This information is not intended to replace advice given to you by your health care provider. Make sure you discuss any questions you have with your health care provider. Document Released: 07/29/2005 Document  Revised: 05/08/2016 Document Reviewed: 04/01/2016 Elsevier Interactive Patient Education  2017 [employer]   In late 2019, the [facility] will [facility] to [facility]. At that time, the MAU will no longer serve non-pregnant patients. We encourage you to establish care with a provider before that time, so that you can be seen with any GYN concerns, like vaginal discharge, urinary tract infection, etc.. in a timely manner. In order to make the office visit more convenient, the Center [facility] at [facility] will be offering evening hours from 4pm-7:30pm on Mondays starting 07/12/17. There will be same-day appointments, walk-in appointments and scheduled appointments available during this time.    Center for [facility] @ [facility] [phone]- [phone]  For urgent needs, [facility] is also available for management of urgent GYN complaints such as vaginal discharge.   [employer] extends eligibility for family planning services to reduce unintended pregnancies and improve the well-being of children and families.   Eligible individuals whose income is at or below 195% of the federal poverty level and who are:  - [place] citizens, documented immigrants or qualified aliens;  - Residents of [place];  - Not incarcerated; and  - Not pregnant.   [employer] Family Planning Contact Information:  Medical Assistance Clinical Section Phone: [phone] Email: dma.besmart@[facility] .[email or web address].gov

## 2017-08-24 NOTE — MAU Note (Signed)
Patient presents to MAU with c/o abd pain. Patient states she "feels like something is moving inside". Has been in pain since Oct 6th. States she had light bloody discharge x2 two weeks ago. Denies currently bleeding or discharge.

## 2017-08-25 LAB — GC/CHLAMYDIA PROBE AMP (~~LOC~~) NOT AT ARMC
CHLAMYDIA, DNA PROBE: NEGATIVE
NEISSERIA GONORRHEA: NEGATIVE

## 2017-08-26 LAB — URINE CULTURE

## 2017-09-06 ENCOUNTER — Encounter (HOSPITAL_COMMUNITY): Payer: Self-pay | Admitting: *Deleted

## 2017-09-06 ENCOUNTER — Other Ambulatory Visit: Payer: Self-pay

## 2017-09-06 ENCOUNTER — Emergency Department (HOSPITAL_COMMUNITY)
Admission: EM | Admit: 2017-09-06 | Discharge: 2017-09-06 | Disposition: A | Discharge status: Home / Self Care | Payer: Self-pay | Attending: Emergency Medicine | Admitting: Emergency Medicine

## 2017-09-06 DIAGNOSIS — M436 Torticollis: Secondary | ICD-10-CM | POA: Insufficient documentation

## 2017-09-06 DIAGNOSIS — R51 Headache: Secondary | ICD-10-CM | POA: Insufficient documentation

## 2017-09-06 DIAGNOSIS — Z79899 Other long term (current) drug therapy: Secondary | ICD-10-CM | POA: Insufficient documentation

## 2017-09-06 MED ORDER — KETOROLAC TROMETHAMINE 60 MG/2ML IM SOLN
60.0000 mg | Freq: Once | INTRAMUSCULAR | Status: AC
  Administered 2017-09-06: 60 mg via INTRAMUSCULAR
  Filled 2017-09-06: qty 2

## 2017-09-06 MED ORDER — IBUPROFEN 800 MG PO TABS: 800.0000 mg | ORAL_TABLET | Freq: Three times a day (TID) | ORAL | 0 refills | Status: DC

## 2017-09-06 MED ORDER — DIAZEPAM 5 MG PO TABS: 5.0000 mg | ORAL_TABLET | Freq: Four times a day (QID) | ORAL | 0 refills | Status: DC | PRN

## 2017-09-06 MED ORDER — DIAZEPAM 5 MG PO TABS
5.0000 mg | ORAL_TABLET | Freq: Once | ORAL | Status: AC
  Administered 2017-09-06: 5 mg via ORAL
  Filled 2017-09-06: qty 1

## 2017-09-06 NOTE — ED Provider Notes (Signed)
[facility] EMERGENCY DEPARTMENT Provider Note   CSN: [id number] Arrival date & time: 09/06/17  0236     History   Chief Complaint Chief Complaint  Patient presents with  . Torticollis    HPI Chelsea Castaneda is a 36 y.o. female.  Patient presents to the emergency department for evaluation of left-sided neck pain.  Patient reports that symptoms began 3 weeks ago.  She reports she had sudden onset of pain in the left side of her neck and has been present ever since.  She cannot turn her head to the right because of worsening pain.  Pain is on the left side of the neck and radiates up to the back of the head, causing headache.  Pain also goes down into the left shoulder area.  No numbness, tingling or weakness of upper extremities.  She has not had a fever.      Past Medical History:  Diagnosis Date  . Blood transfusion without reported diagnosis    no infusion reaction  . Tuberculosis    inactive    Patient Active Problem List   Diagnosis Date Noted  . S/P cesarean section 04/05/2015  . Unspecified vitamin D deficiency 10/27/2013    Past Surgical History:  Procedure Laterality Date  . DILATION AND CURETTAGE OF UTERUS    . NOSE SURGERY      OB History    Gravida Para Term Preterm AB Living   4 4 4     4    SAB TAB Ectopic Multiple Live Births         0 4       Home Medications    Prior to Admission medications   Medication Sig Start Date End Date Taking? Authorizing Provider  fluticasone (FLONASE) 50 MCG/ACT nasal spray Place 1 spray into both nostrils daily. 08/02/16   Audry Pili, PA-C  ibuprofen (ADVIL,MOTRIN) 600 MG tablet Take 1 tablet (600 mg total) by mouth every 6 (six) hours as needed. 08/14/16   Bethel Born, PA-C  meloxicam (MOBIC) 15 MG tablet Take 1 tablet (15 mg total) by mouth daily. 12/02/16   Bethel Born, PA-C  methocarbamol (ROBAXIN) 500 MG tablet Take 1 tablet (500 mg total) by mouth at bedtime and may repeat  dose one time if needed. 12/02/16   Bethel Born, PA-C  traMADol (ULTRAM) 50 MG tablet Take 1 tablet (50 mg total) by mouth every 12 (twelve) hours as needed. 02/14/16   Tomasita Crumble, MD    Family History Family History  Problem Relation Age of Onset  . Diabetes Mother   . Hypertension Mother     Social History Social History   Tobacco Use  . Smoking status: Never Smoker  . Smokeless tobacco: Never Used  Substance Use Topics  . Alcohol use: No    Alcohol/week: 0.0 oz  . Drug use: No     Allergies   Patient has no known allergies.   Review of Systems Review of Systems  Musculoskeletal: Positive for neck pain.  Neurological: Positive for headaches.  All other systems reviewed and are negative.    Physical Exam Updated Vital Signs BP 118/80   Pulse 74   Temp 98.3 F (36.8 C)   Resp 16   Ht 5\' 4"  (1.626 m)   Wt 77.1 kg (170 lb)   SpO2 98%   BMI 29.18 kg/m   Physical Exam  Constitutional: She is oriented to person, place, and time. She appears well-developed and  well-nourished. No distress.  HENT:  Head: Normocephalic and atraumatic.  Right Ear: Hearing normal.  Left Ear: Hearing normal.  Nose: Nose normal.  Mouth/Throat: Oropharynx is clear and moist and mucous membranes are normal.  Eyes: Conjunctivae and EOM are normal. Pupils are equal, round, and reactive to light.  Neck: Neck supple. Muscular tenderness (left paraspinal spasm) present.  Cardiovascular: Regular rhythm, S1 normal and S2 normal. Exam reveals no gallop and no friction rub.  No murmur heard. Pulmonary/Chest: Effort normal and breath sounds normal. No respiratory distress. She exhibits no tenderness.  Abdominal: Soft. Normal appearance and bowel sounds are normal. There is no hepatosplenomegaly. There is no tenderness. There is no rebound, no guarding, no tenderness at McBurney's point and negative Murphy's sign. No hernia.  Musculoskeletal: Normal range of motion.  Neurological: She is  alert and oriented to person, place, and time. She has normal strength. No cranial nerve deficit or sensory deficit. Coordination normal. GCS eye subscore is 4. GCS verbal subscore is 5. GCS motor subscore is 6.  Normal grip strength bilateral upper extremities, normal sensation bilateral upper extremities, no pronator drift  Skin: Skin is warm, dry and intact. No rash noted. No cyanosis.  Psychiatric: She has a normal mood and affect. Her speech is normal and behavior is normal. Thought content normal.  Nursing note and vitals reviewed.    ED Treatments / Results  Labs (all labs ordered are listed, but only abnormal results are displayed) Labs Reviewed - No data to display  EKG  EKG Interpretation None       Radiology No results found.  Procedures Procedures (including critical care time)  Medications Ordered in ED Medications  ketorolac (TORADOL) injection 60 mg (60 mg Intramuscular Given 09/06/17 0352)  diazepam (VALIUM) tablet 5 mg (5 mg Oral Given 09/06/17 0352)     Initial Impression / Assessment and Plan / ED Course  I have reviewed the triage vital signs and the nursing notes.  Pertinent labs & imaging results that were available during my care of the patient were reviewed by me and considered in my medical decision making (see chart for details).     Patient presents with a 3-week history of nontraumatic left-sided neck pain.  She has decreased range of motion, especially to the right.  Examination reveals spasm of the muscles of the left paraspinal region.  This is consistent with torticollis.  She does not have any infectious symptoms or fever.  No concern for meningitis.  Final Clinical Impressions(s) / ED Diagnoses   Final diagnoses:  Torticollis, acute    ED Discharge Orders    None       Pollina, Canary Brimhristopher J, MD 09/06/17 [phone]

## 2017-09-06 NOTE — ED Triage Notes (Signed)
Headache lt neck pain with stiffness for 3 weeks   No known injury  lmp last week

## 2017-10-26 ENCOUNTER — Emergency Department (HOSPITAL_COMMUNITY): Payer: Self-pay

## 2017-10-26 ENCOUNTER — Emergency Department (HOSPITAL_COMMUNITY)
Admission: EM | Admit: 2017-10-26 | Discharge: 2017-10-26 | Disposition: A | Discharge status: Home / Self Care | Payer: Self-pay | Attending: Emergency Medicine | Admitting: Emergency Medicine

## 2017-10-26 ENCOUNTER — Other Ambulatory Visit: Payer: Self-pay

## 2017-10-26 ENCOUNTER — Encounter (HOSPITAL_COMMUNITY): Payer: Self-pay | Admitting: Emergency Medicine

## 2017-10-26 DIAGNOSIS — R110 Nausea: Secondary | ICD-10-CM | POA: Insufficient documentation

## 2017-10-26 DIAGNOSIS — R0789 Other chest pain: Secondary | ICD-10-CM | POA: Insufficient documentation

## 2017-10-26 DIAGNOSIS — R42 Dizziness and giddiness: Secondary | ICD-10-CM | POA: Insufficient documentation

## 2017-10-26 DIAGNOSIS — R05 Cough: Secondary | ICD-10-CM | POA: Insufficient documentation

## 2017-10-26 DIAGNOSIS — M542 Cervicalgia: Secondary | ICD-10-CM | POA: Insufficient documentation

## 2017-10-26 DIAGNOSIS — R0602 Shortness of breath: Secondary | ICD-10-CM | POA: Insufficient documentation

## 2017-10-26 LAB — I-STAT TROPONIN, ED
Troponin i, poc: 0 ng/mL (ref 0.00–0.08)
Troponin i, poc: 0 ng/mL (ref 0.00–0.08)

## 2017-10-26 LAB — BASIC METABOLIC PANEL
Anion gap: 6 (ref 5–15)
BUN: 14 mg/dL (ref 6–20)
CHLORIDE: 104 mmol/L (ref 101–111)
CO2: 27 mmol/L (ref 22–32)
CREATININE: 0.67 mg/dL (ref 0.44–1.00)
Calcium: 9.3 mg/dL (ref 8.9–10.3)
GFR calc Af Amer: >60 mL/min (ref >60)
GFR calc non Af Amer: >60 mL/min (ref >60)
Glucose, Bld: 99 mg/dL (ref 65–99)
Potassium: 4 mmol/L (ref 3.5–5.1)
SODIUM: 137 mmol/L (ref 135–145)

## 2017-10-26 LAB — CBC
HCT: 40 % (ref 36.0–46.0)
Hemoglobin: 13 g/dL (ref 12.0–15.0)
MCH: 29.5 pg (ref 26.0–34.0)
MCHC: 32.5 g/dL (ref 30.0–36.0)
MCV: 90.9 fL (ref 78.0–100.0)
PLATELETS: 260 10*3/uL (ref 150–400)
RBC: 4.4 MIL/uL (ref 3.87–5.11)
RDW: 13.6 % (ref 11.5–15.5)
WBC: 4.2 10*3/uL (ref 4.0–10.5)

## 2017-10-26 LAB — I-STAT BETA HCG BLOOD, ED (MC, WL, AP ONLY)

## 2017-10-26 MED ORDER — OMEPRAZOLE 20 MG PO CPDR: 20.0000 mg | DELAYED_RELEASE_CAPSULE | Freq: Two times a day (BID) | ORAL | 0 refills | Status: DC

## 2017-10-26 MED ORDER — IBUPROFEN 800 MG PO TABS
800.0000 mg | ORAL_TABLET | Freq: Once | ORAL | Status: AC
Start: 2017-10-26 — End: 2017-10-26
  Administered 2017-10-26: 800 mg via ORAL
  Filled 2017-10-26: qty 1

## 2017-10-26 MED ORDER — GI COCKTAIL ~~LOC~~
30.0000 mL | Freq: Once | ORAL | Status: AC
  Administered 2017-10-26: 30 mL via ORAL
  Filled 2017-10-26: qty 30

## 2017-10-26 MED ORDER — ONDANSETRON 4 MG PO TBDP
4.0000 mg | ORAL_TABLET | Freq: Once | ORAL | Status: AC
  Administered 2017-10-26: 4 mg via ORAL
  Filled 2017-10-26: qty 1

## 2017-10-26 NOTE — ED Notes (Signed)
ED Provider at bedside. 

## 2017-10-26 NOTE — ED Notes (Signed)
Pt refused wheelchair.  

## 2017-10-26 NOTE — Discharge Instructions (Signed)
Alternate 600 mg of ibuprofen and [phone] mg of Tylenol every 3 hours as needed for pain. Do not exceed 4000 mg of Tylenol daily.  Start taking Prilosec twice daily before meals for the next 2 weeks to see if this improves her symptoms.  Apply ice or heat to the chest wall and neck for comfort, which ever feels best.  Drink plenty of fluids and get plenty of rest.  Follow-up with cardiology for reevaluation of your symptoms.  Return to the ED if any concerning signs or symptoms develop such as fevers, severe chest pain or shortness of breath, or coughing up blood.

## 2017-10-26 NOTE — ED Triage Notes (Signed)
Pt c/o left sided chest pain "squeezing" pain, radiating down left arm, through to back, with short of breath.

## 2017-10-26 NOTE — ED Provider Notes (Signed)
[facility] EMERGENCY DEPARTMENT Provider Note   CSN: [id number] Arrival date & time: 10/26/17  1351     History   Chief Complaint Chief Complaint  Patient presents with  . Chest Pain    HPI Chelsea Castaneda is a 36 y.o. female with history of inactive tuberculosis and blood transfusion presents with chief complaint acute onset, constant left-sided chest pain for 1 week.  She states that pain occurred at rest and describes it as a squeezing burning pressure-like sensation which radiates down her left arm and up to the left side of her neck.  No aggravating or alleviating factors.  She notes associated intermittent lightheadedness and nausea with her symptoms.  Pain does not worsen with deep inspiration, cough, movement, or after mealtimes.  She notes intermittent shortness of breath as well as nonproductive cough for the past 2 days.  She states this feels exactly like pain she has had in the past.  She has had multiple workups for this chest pain in the ED which have all been negative, and she has followed up with a cardiologist who informed her that her pain was musculoskeletal in nature.  She states that when her pain occurs she just waits for it to resolve, and has not tried any medications or other treatments for her symptoms.  She denies any recent trauma or falls.  She denies alcohol use, illicit drug use, or excessive caffeine intake.  She is a non-smoker and is not exposed to secondhand smoke.  She denies palpitations, abdominal pain, vomiting, syncope, or lower extremity edema.  The history is provided by the patient.    Past Medical History:  Diagnosis Date  . Blood transfusion without reported diagnosis    no infusion reaction  . Tuberculosis    inactive    Patient Active Problem List   Diagnosis Date Noted  . S/P cesarean section 04/05/2015  . Unspecified vitamin D deficiency 10/27/2013    Past Surgical History:  Procedure Laterality Date  .  CESAREAN SECTION N/A 04/04/2015   Procedure: CESAREAN SECTION;  Surgeon: Brock Badharles A Harper, MD;  Location: [facility];  Service: Obstetrics;  Laterality: N/A;  . DILATION AND CURETTAGE OF UTERUS    . NOSE SURGERY      OB History    Gravida Para Term Preterm AB Living   4 4 4     4    SAB TAB Ectopic Multiple Live Births         0 4       Home Medications    Prior to Admission medications   Medication Sig Start Date End Date Taking? Authorizing Provider  diazepam (VALIUM) 5 MG tablet Take 1 tablet (5 mg total) every 6 (six) hours as needed by mouth for muscle spasms. Patient not taking: Reported on 10/26/2017 09/06/17   Gilda CreasePollina, Christopher J, MD  fluticasone [facility](FLONASE) 50 MCG/ACT nasal spray Place 1 spray into both nostrils daily. Patient not taking: Reported on 10/26/2017 08/02/16   Audry PiliMohr, Tyler, PA-C  ibuprofen (ADVIL,MOTRIN) 800 MG tablet Take 1 tablet (800 mg total) 3 (three) times daily by mouth. Patient not taking: Reported on 10/26/2017 09/06/17   Gilda CreasePollina, Christopher J, MD  meloxicam (MOBIC) 15 MG tablet Take 1 tablet (15 mg total) by mouth daily. Patient not taking: Reported on 10/26/2017 12/02/16   Bethel BornGekas, Kelly Marie, PA-C  methocarbamol (ROBAXIN) 500 MG tablet Take 1 tablet (500 mg total) by mouth at bedtime and may repeat dose one time if needed. Patient  not taking: Reported on 10/26/2017 12/02/16   Bethel BornGekas, Kelly Marie, PA-C  omeprazole (PRILOSEC) 20 MG capsule Take 1 capsule (20 mg total) by mouth 2 (two) times daily before a meal for 14 days. 10/26/17 11/09/17  Michela PitcherFawze, Tydarius Yawn A, PA-C  traMADol (ULTRAM) 50 MG tablet Take 1 tablet (50 mg total) by mouth every 12 (twelve) hours as needed. Patient not taking: Reported on 10/26/2017 02/14/16   Tomasita Crumbleni, Adeleke, MD    Family History Family History  Problem Relation Age of Onset  . Diabetes Mother   . Hypertension Mother     Social History Social History   Tobacco Use  . Smoking status: Never Smoker  . Smokeless tobacco: Never Used    Substance Use Topics  . Alcohol use: No    Alcohol/week: 0.0 oz  . Drug use: No     Allergies   Patient has no known allergies.   Review of Systems Review of Systems  Constitutional: Negative for chills and fever.  Respiratory: Positive for cough and shortness of breath.   Cardiovascular: Positive for chest pain. Negative for palpitations and leg swelling.  Gastrointestinal: Positive for nausea. Negative for abdominal pain, diarrhea and vomiting.  Musculoskeletal: Positive for arthralgias.  Neurological: Negative for headaches.  All other systems reviewed and are negative.    Physical Exam Updated Vital Signs BP 119/80   Pulse 65   Temp 99.1 F (37.3 C) (Oral)   Resp 15   Ht 5\' 5"  (1.651 m)   Wt 77.1 kg (170 lb)   SpO2 100%   BMI 28.29 kg/m   Physical Exam  Constitutional: She appears well-developed and well-nourished. No distress.  HENT:  Head: Normocephalic and atraumatic.  Eyes: Conjunctivae are normal. Right eye exhibits no discharge. Left eye exhibits no discharge.  Neck: Normal range of motion. Neck supple. No JVD present. No tracheal deviation present.  No midline spine TTP, no paraspinal muscle tenderness, no deformity, crepitus, or step-off noted.  However, some pain elicited on the left side of the neck with lateral rotation of the neck to the left   Cardiovascular: Normal rate and regular rhythm.  Pulses:      Radial pulses are 2+ on the right side, and 2+ on the left side.       Dorsalis pedis pulses are 2+ on the right side, and 2+ on the left side.       Posterior tibial pulses are 2+ on the right side, and 2+ on the left side.  Pulmonary/Chest: Effort normal and breath sounds normal.  Left upper anterior chest wall tender to palpation with no crepitus, deformity, ecchymosis, or paradoxical wall motion, equal rise and fall of chest, no increased work of breathing, speaking in full sentences without difficulty.  Abdominal: Soft. Bowel sounds are  normal. She exhibits no distension. There is no tenderness. There is no guarding.  Musculoskeletal: She exhibits no edema.       Right lower leg: Normal. She exhibits no tenderness and no edema.       Left lower leg: Normal. She exhibits no tenderness and no edema.  Neurological: She is alert.  Skin: Skin is warm and dry. Capillary refill takes less than 2 seconds. No erythema.  Psychiatric: She has a normal mood and affect. Her behavior is normal.  Nursing note and vitals reviewed.    ED Treatments / Results  Labs (all labs ordered are listed, but only abnormal results are displayed) Labs Reviewed  BASIC METABOLIC PANEL  CBC  I-STAT TROPONIN, ED  I-STAT BETA HCG BLOOD, ED ([facility], [facility], [facility] ONLY)  I-STAT TROPONIN, ED    EKG  EKG Interpretation  Date/Time:  Tuesday October 26 2017 13:54:54 EST Ventricular Rate:  76 PR Interval:  136 QRS Duration: 80 QT Interval:  360 QTC Calculation: 405 R Axis:   81 Text Interpretation:  Normal sinus rhythm with sinus arrhythmia Nonspecific ST abnormality Abnormal ECG No significant change since last tracing Confirmed by Shaune Pollack [phone]) on 10/26/2017 5:32:08 PM       Radiology Dg Chest 2 View  Result Date: 10/26/2017 CLINICAL DATA:  Chest pain, mid and left-sided burning EXAM: CHEST  2 VIEW COMPARISON:  12/02/2016 FINDINGS: The heart size and mediastinal contours are within normal limits. Both lungs are clear. The visualized skeletal structures are unremarkable. IMPRESSION: No active cardiopulmonary disease. Electronically Signed   By: Elige Ko   On: 10/26/2017 15:53    Procedures Procedures (including critical care time)  Medications Ordered in ED Medications  gi cocktail (Maalox,Lidocaine,Donnatal) (30 mLs Oral Given 10/26/17 1647)  ondansetron (ZOFRAN-ODT) disintegrating tablet 4 mg (4 mg Oral Given 10/26/17 1647)  ibuprofen (ADVIL,MOTRIN) tablet 800 mg (800 mg Oral Given 10/26/17 1647)     Initial Impression /  Assessment and Plan / ED Course  I have reviewed the triage vital signs and the nursing notes.  Pertinent labs & imaging results that were available during my care of the patient were reviewed by me and considered in my medical decision making (see chart for details).     Patient presents with her typical chest pain.  Pain is been constant for 1 week.  Afebrile, vital signs are stable.  She is nontoxic in appearance.  Serial troponins are negative, and EKG shows no evidence of ST segment abnormality or arrhythmia.  She has a HEART score of 0. I highly doubt her symptoms are cardiac in etiology.  She is PERC negative and I doubt PE. Chest x-ray is clear with no evidence of pneumonia or effusion.  No evidence of pericarditis, myocarditis, or bronchitis.  Lab work is reassuring with no leukocytosi, anemia, or electrolyte abnormalities.  On reevaluation, patient states she is feeling mildly better after administration of Zofran, GI cocktail, and ibuprofen.  No further emergent workup required at this time.  I suspect her symptoms are musculoskeletal in nature as they are reproducible on palpation, and she has some pain on the left side of the neck with lateral rotation of the cervical spine to the left.  However, there may be some component of reflux to her symptoms and she will try a 2-week course of Prilosec to see if her symptoms improve.  She will follow up with her cardiologist for reevaluation on an outpatient basis.  Discussed indications for return to the ED. Pt verbalized understanding of and agreement with plan and is safe for discharge home at this time.  She has no complaints prior to discharge.  Final Clinical Impressions(s) / ED Diagnoses   Final diagnoses:  Atypical chest pain    ED Discharge Orders        Ordered    omeprazole (PRILOSEC) 20 MG capsule  2 times daily before meals     10/26/17 1805       Jeanie Sewer, PA-C 10/26/17 1810    Shaune Pollack, MD 10/27/17 [phone]

## 2017-12-15 ENCOUNTER — Other Ambulatory Visit: Payer: Self-pay

## 2017-12-15 ENCOUNTER — Encounter (HOSPITAL_COMMUNITY): Payer: Self-pay | Admitting: Emergency Medicine

## 2017-12-15 ENCOUNTER — Emergency Department (HOSPITAL_COMMUNITY)
Admission: EM | Admit: 2017-12-15 | Discharge: 2017-12-15 | Disposition: A | Discharge status: Home / Self Care | Payer: Medicaid Other | Attending: Emergency Medicine | Admitting: Emergency Medicine

## 2017-12-15 DIAGNOSIS — J069 Acute upper respiratory infection, unspecified: Secondary | ICD-10-CM | POA: Insufficient documentation

## 2017-12-15 DIAGNOSIS — K0889 Other specified disorders of teeth and supporting structures: Secondary | ICD-10-CM

## 2017-12-15 DIAGNOSIS — Z79899 Other long term (current) drug therapy: Secondary | ICD-10-CM | POA: Insufficient documentation

## 2017-12-15 MED ORDER — FLUTICASONE PROPIONATE 50 MCG/ACT NA SUSP: 1.0000 | Freq: Every day | NASAL | 2 refills | Status: DC

## 2017-12-15 MED ORDER — PENICILLIN V POTASSIUM 500 MG PO TABS: 500.0000 mg | ORAL_TABLET | Freq: Four times a day (QID) | ORAL | 0 refills | Status: AC

## 2017-12-15 NOTE — ED Provider Notes (Signed)
[facility] EMERGENCY DEPARTMENT Provider Note   CSN: [id number] Arrival date & time: 12/15/17  1717     History   Chief Complaint Chief Complaint  Patient presents with  . URI  . Dental Pain    HPI Chelsea Castaneda is a 37 y.o. female past medical history of inactive TB who presents for evaluation of 1 week of rhinorrhea, nasal congestion, generalized body aches, chills.  Patient also reports that she has had intermittent left-sided dental pain for the last 4 months.  Patient reports chills but denies any fever.  Patient reports that she has not taken any medication for symptoms.  She denies any cough.  Patient states that she has been able to eat and drink appropriately.  Patient reports some left-sided facial swelling that began yesterday.  No warmth, erythema noted to the face.  She denies any difficulty breathing, difficulty swallowing, difficulty tolerating her secretions.  She does not follow-up with a dentist.  Patient denies any decreased appetite, chest pain, difficulty breathing, abdominal pain, nausea/vomiting.  The history is provided by the patient.    Past Medical History:  Diagnosis Date  . Blood transfusion without reported diagnosis    no infusion reaction  . Tuberculosis    inactive    Patient Active Problem List   Diagnosis Date Noted  . S/P cesarean section 04/05/2015  . Unspecified vitamin D deficiency 10/27/2013    Past Surgical History:  Procedure Laterality Date  . CESAREAN SECTION N/A 04/04/2015   Procedure: CESAREAN SECTION;  Surgeon: Brock Bad, MD;  Location: [facility];  Service: Obstetrics;  Laterality: N/A;  . DILATION AND CURETTAGE OF UTERUS    . NOSE SURGERY      OB History    Gravida Para Term Preterm AB Living   4 4 4     4    SAB TAB Ectopic Multiple Live Births         0 4       Home Medications    Prior to Admission medications   Medication Sig Start Date End Date Taking? Authorizing Provider    diazepam (VALIUM) 5 MG tablet Take 1 tablet (5 mg total) every 6 (six) hours as needed by mouth for muscle spasms. Patient not taking: Reported on 10/26/2017 09/06/17   Gilda Crease, MD  fluticasone [facility]) 50 MCG/ACT nasal spray Place 1 spray into both nostrils daily. 12/15/17   Maxwell Caul, PA-C  ibuprofen (ADVIL,MOTRIN) 800 MG tablet Take 1 tablet (800 mg total) 3 (three) times daily by mouth. Patient not taking: Reported on 10/26/2017 09/06/17   Gilda Crease, MD  meloxicam (MOBIC) 15 MG tablet Take 1 tablet (15 mg total) by mouth daily. Patient not taking: Reported on 10/26/2017 12/02/16   Bethel Born, PA-C  methocarbamol (ROBAXIN) 500 MG tablet Take 1 tablet (500 mg total) by mouth at bedtime and may repeat dose one time if needed. Patient not taking: Reported on 10/26/2017 12/02/16   Bethel Born, PA-C  omeprazole (PRILOSEC) 20 MG capsule Take 1 capsule (20 mg total) by mouth 2 (two) times daily before a meal for 14 days. 10/26/17 11/09/17  Michela Pitcher A, PA-C  penicillin v potassium (VEETID) 500 MG tablet Take 1 tablet (500 mg total) by mouth 4 (four) times daily for 7 days. 12/15/17 12/22/17  Maxwell Caul, PA-C  traMADol (ULTRAM) 50 MG tablet Take 1 tablet (50 mg total) by mouth every 12 (twelve) hours as needed. Patient not  taking: Reported on 10/26/2017 02/14/16   Tomasita Crumbleni, Adeleke, MD    Family History Family History  Problem Relation Age of Onset  . Diabetes Mother   . Hypertension Mother     Social History Social History   Tobacco Use  . Smoking status: Never Smoker  . Smokeless tobacco: Never Used  Substance Use Topics  . Alcohol use: No    Alcohol/week: 0.0 oz  . Drug use: No     Allergies   Patient has no known allergies.   Review of Systems Review of Systems  Constitutional: Positive for chills. Negative for appetite change and fever.  HENT: Positive for dental problem and facial swelling. Negative for drooling and trouble  swallowing.   Respiratory: Negative for shortness of breath.      Physical Exam Updated Vital Signs BP 131/82 (BP Location: Right Arm)   Pulse 80   Temp 98.2 F (36.8 C) (Oral)   Resp 18   LMP 12/13/2017 (Exact Date)   SpO2 100%   Physical Exam  Constitutional: She is oriented to person, place, and time. She appears well-developed and well-nourished.  Sitting comfortably on examination table  HENT:  Head: Normocephalic and atraumatic.  Nose: Mucosal edema and rhinorrhea present.  Mouth/Throat: Oropharynx is clear and moist and mucous membranes are normal. No trismus in the jaw. Normal dentition. Dental caries present. No dental abscesses.    Scattered dental caries throughout.  Uvula is midline.  No trismus.  No gingival erythema, swelling.  No evidence of an identifiable dental mass.  No facial swelling, warmth or tenderness.  Airway is patent, phonation is intact.  Eyes: Conjunctivae, EOM and lids are normal. Pupils are equal, round, and reactive to light.  Neck: Full passive range of motion without pain.  Cardiovascular: Normal rate, regular rhythm, normal heart sounds and normal pulses. Exam reveals no gallop and no friction rub.  No murmur heard. Pulmonary/Chest: Effort normal and breath sounds normal. She has no wheezes. She has no rhonchi. She has no rales.  No evidence of respiratory distress. Able to speak in full sentences without difficulty.  Abdominal: Soft. Normal appearance. There is no tenderness. There is no rigidity and no guarding.  Musculoskeletal: Normal range of motion.  Neurological: She is alert and oriented to person, place, and time.  Skin: Skin is warm and dry. Capillary refill takes less than 2 seconds.  Psychiatric: She has a normal mood and affect. Her speech is normal.  Nursing note and vitals reviewed.    ED Treatments / Results  Labs (all labs ordered are listed, but only abnormal results are displayed) Labs Reviewed - No data to  display  EKG  EKG Interpretation None       Radiology No results found.  Procedures Procedures (including critical care time)  Medications Ordered in ED Medications - No data to display   Initial Impression / Assessment and Plan / ED Course  I have reviewed the triage vital signs and the nursing notes.  Pertinent labs & imaging results that were available during my care of the patient were reviewed by me and considered in my medical decision making (see chart for details).     37 y.o. F with PMH/o inactive TB who presents for evaluation of nasal congestion, rhinorrhea that has been ongoing for the last week.  No fevers, cough, chest pain, difficulty breathing.  Patient also reports 4 months of intermittent dental pain.  Patient reports that she feels like the left side of her face  has gotten swollen in the last few days.  She has been able to eat and drink without any difficulty.  She has tolerated secretions without any difficulty.  She does not have a dentist that she follows up with. Patient is afebrile, non-toxic appearing, sitting comfortably on examination table. Vital signs reviewed and stable.  On exam, I do not appreciate any facial swelling, facial asymmetry.  Face appears to be symmetric with no overlying warmth, erythema, soft tissue swelling.  Patient does have diffuse dental caries.  She does have dental caries noted to tooth #13 which correlates to area of pain.  There is some slight surrounding gingival erythema.  No gingival edema.  No fluctuance, mass noted.  No identifiable dental abscess.  No trismus, uvula is midline.  History/physical exam is not concerning for Ludwig angina or peritonsillar abscess.  Consider upper respiratory infection.  Do not suspect influenza given duration of symptoms, presentation in the ED.  Patient with no cough, fever, evidence of rales on exam.  History/physical exam is not concerning for pneumonia.  On exam, I do not see any identifiable  dental abscess that would require drainage.  Patient does report some left-sided facial swelling, though I do not appreciate any swelling.  Suspect that symptoms are likely caused by her dental caries.  We will plan to provide antibiotic therapy for preventative treatment of any dental abscess. Patient with no known drug allergies.  Will provide patient with outpatient dental resources for her to follow-up with.  Instructed patient to call dental resources for further evaluation. Provided patient with a list of clinic resources to use if he does not have a PCP. Instructed to call them today to arrange follow-up in the next 24-48 hours.  Patient had ample opportunity for questions and discussion. All patient's questions were answered with full understanding. Strict return precautions discussed. Patient expresses understanding and agreement to plan.    Final Clinical Impressions(s) / ED Diagnoses   Final diagnoses:  Upper respiratory tract infection, unspecified type  Pain, dental    ED Discharge Orders        Ordered    fluticasone (FLONASE) 50 MCG/ACT nasal spray  Daily     12/15/17 2020    penicillin v potassium (VEETID) 500 MG tablet  4 times daily     12/15/17 2020       Maxwell Caul, PA-C 12/16/17 0053    Terrilee Files, MD 12/16/17 1140

## 2017-12-15 NOTE — ED Notes (Signed)
PT states understanding of care given, follow up care, and medication prescribed. PT ambulated from ED to car with a steady gait. 

## 2017-12-15 NOTE — ED Triage Notes (Signed)
Pt to ED c/o dental pain (L sided) and URI symptoms (congestion, runny nose, h/a, chills). Patient reports it feels like the L side of her face is swollen - no obvious swelling noted. Resp e/u, talking in clear, full sentences.

## 2017-12-15 NOTE — Discharge Instructions (Signed)
Take the medications as directed.   You can take Tylenol or Ibuprofen as directed for pain. You can alternate Tylenol and Ibuprofen every 4 hours. If you take Tylenol at 1pm, then you can take Ibuprofen at 5pm. Then you can take Tylenol again at 9pm.   The exam and treatment you received today has been provided on an emergency basis only. This is not a substitute for complete medical or dental care. If your problem worsens or new symptoms (problems) appear, and you are unable to arrange prompt follow-up care with your dentist, call or return to this location. If you do not have a dentist, please follow-up with one on the list provided  CALL YOUR DENTIST OR RETURN IMMEDIATELY IF you develop a fever, rash, difficulty breathing or swallowing, neck or facial swelling, or other potentially serious concerns.   Please follow-up with one of the dental clinics provided to you below or in your paperwork. Call and tell them you were seen in the Emergency Dept and arrange for an appointment. You may have to call multiple places in order to find a place to be seen.  Return to the emergency department for any fever, difficulty breathing, cough, difficulty swallowing, vomiting or any other worsening or concerning symptoms.   Dental Assistance If the dentist on-call cannot see you, please use the resources below:   Patients with Medicaid: [facility] [phone], [phone] [address], [phone]  If unable to pay, or uninsured, contact [facility] [phone]) or [facility] [phone] in [place], [phone] in [place]) to become qualified for the adult dental clinic  Other Low-Cost Community Dental Services: [facility]- [address], [place], [place], [address]    [phone], Ext. [phone]    2nd and 4th Thursday of the month at 6:30am    10 clients each day by appointment, can sometimes see walk-in     patients if someone does not show for an  appointment [facility]- [address], [place], [place], [id number]    [phone] [facility]- [address], [place], [place], [address]    [phone]  [facility]- [phone] [facility]- [phone] [facility]- [phone]

## 2018-02-11 ENCOUNTER — Emergency Department (HOSPITAL_COMMUNITY)
Admission: EM | Admit: 2018-02-11 | Discharge: 2018-02-12 | Disposition: A | Discharge status: Home / Self Care | Payer: Self-pay | Attending: Emergency Medicine | Admitting: Emergency Medicine

## 2018-02-11 ENCOUNTER — Other Ambulatory Visit: Payer: Self-pay

## 2018-02-11 ENCOUNTER — Encounter (HOSPITAL_COMMUNITY): Payer: Self-pay | Admitting: Emergency Medicine

## 2018-02-11 DIAGNOSIS — N3000 Acute cystitis without hematuria: Secondary | ICD-10-CM | POA: Insufficient documentation

## 2018-02-11 DIAGNOSIS — N760 Acute vaginitis: Secondary | ICD-10-CM | POA: Insufficient documentation

## 2018-02-11 DIAGNOSIS — R079 Chest pain, unspecified: Secondary | ICD-10-CM | POA: Insufficient documentation

## 2018-02-11 DIAGNOSIS — R0602 Shortness of breath: Secondary | ICD-10-CM | POA: Insufficient documentation

## 2018-02-11 DIAGNOSIS — Z79899 Other long term (current) drug therapy: Secondary | ICD-10-CM | POA: Insufficient documentation

## 2018-02-11 DIAGNOSIS — B9689 Other specified bacterial agents as the cause of diseases classified elsewhere: Secondary | ICD-10-CM | POA: Insufficient documentation

## 2018-02-11 LAB — COMPREHENSIVE METABOLIC PANEL
ALBUMIN: 3.8 g/dL (ref 3.5–5.0)
ALK PHOS: 56 U/L (ref 38–126)
ALT: 18 U/L (ref 14–54)
AST: 20 U/L (ref 15–41)
Anion gap: 7 (ref 5–15)
BILIRUBIN TOTAL: 0.4 mg/dL (ref 0.3–1.2)
BUN: 12 mg/dL (ref 6–20)
CALCIUM: 9.3 mg/dL (ref 8.9–10.3)
CO2: 24 mmol/L (ref 22–32)
Chloride: 108 mmol/L (ref 101–111)
Creatinine, Ser: 0.81 mg/dL (ref 0.44–1.00)
GFR calc Af Amer: >60 mL/min (ref >60)
GFR calc non Af Amer: >60 mL/min (ref >60)
GLUCOSE: 117 mg/dL — AB (ref 65–99)
Potassium: 4 mmol/L (ref 3.5–5.1)
SODIUM: 139 mmol/L (ref 135–145)
TOTAL PROTEIN: 6.8 g/dL (ref 6.5–8.1)

## 2018-02-11 LAB — CBC
HCT: 36.7 % (ref 36.0–46.0)
Hemoglobin: 11.9 g/dL — ABNORMAL LOW (ref 12.0–15.0)
MCH: 29.3 pg (ref 26.0–34.0)
MCHC: 32.4 g/dL (ref 30.0–36.0)
MCV: 90.4 fL (ref 78.0–100.0)
Platelets: 236 10*3/uL (ref 150–400)
RBC: 4.06 MIL/uL (ref 3.87–5.11)
RDW: 14 % (ref 11.5–15.5)
WBC: 5.1 10*3/uL (ref 4.0–10.5)

## 2018-02-11 LAB — I-STAT TROPONIN, ED: TROPONIN I, POC: 0 ng/mL (ref 0.00–0.08)

## 2018-02-11 LAB — I-STAT BETA HCG BLOOD, ED (MC, WL, AP ONLY): I-stat hCG, quantitative: <5 m[IU]/mL (ref <5)

## 2018-02-11 LAB — LIPASE, BLOOD: Lipase: 28 U/L (ref 11–51)

## 2018-02-11 NOTE — ED Triage Notes (Signed)
Pt from home states she has had this problem for "weeks" but today she felt as if she "couldn't breathe and was going to die". Feels a "squeezing in her heart"  Pt also has complaint of "bladder pain" and white vaginal discharge.  Left lower abdominal pain 10/10 as well.

## 2018-02-12 ENCOUNTER — Emergency Department (HOSPITAL_COMMUNITY): Payer: Self-pay

## 2018-02-12 LAB — URINALYSIS, ROUTINE W REFLEX MICROSCOPIC
Bilirubin Urine: NEGATIVE
Glucose, UA: NEGATIVE mg/dL
Ketones, ur: NEGATIVE mg/dL
NITRITE: NEGATIVE
Protein, ur: NEGATIVE mg/dL
SPECIFIC GRAVITY, URINE: 1.03 (ref 1.005–1.030)
pH: 5 (ref 5.0–8.0)

## 2018-02-12 LAB — WET PREP, GENITAL
Sperm: NONE SEEN
Trich, Wet Prep: NONE SEEN
YEAST WET PREP: NONE SEEN

## 2018-02-12 MED ORDER — CEPHALEXIN 500 MG PO CAPS: 500.0000 mg | ORAL_CAPSULE | Freq: Four times a day (QID) | ORAL | 0 refills | Status: DC

## 2018-02-12 MED ORDER — METRONIDAZOLE 500 MG PO TABS: 500.0000 mg | ORAL_TABLET | Freq: Two times a day (BID) | ORAL | 0 refills | Status: DC

## 2018-02-12 NOTE — ED Notes (Signed)
ED Provider at bedside. 

## 2018-02-12 NOTE — ED Notes (Signed)
Patient transported to X-ray 

## 2018-02-12 NOTE — ED Provider Notes (Signed)
[facility] EMERGENCY DEPARTMENT Provider Note   CSN: [id number] Arrival date & time: 02/11/18  1927     History   Chief Complaint Chief Complaint  Patient presents with  . Shortness of Breath  . Chest Pain    HPI Tabrina Saga Balthazar is a 37 y.o. female with a hx of TB (inactive) presents to the Emergency Department complaining of gradual, persistent, progressively worsening chest tightness onset 4 weeks ago but worsening today.  Today she reports that she felt as if she could not breathe and thought that she might die.  Reports this has been intermittent for several months.  Associated symptoms include 10/10 lower abdominal pain and a white vaginal discharge.   No known aggravating or alleviating factors for patient's chest pain or abdominal pain.  She reports that sexual intercourse is not painful.  She denies more than 1 sexual partner.  She reports she does not believe that she has an STD.  She denies constipation, diarrhea, vomiting, international travel, previous abdominal surgeries.  He does endorse urinary frequency, urinary urgency and some painful urination but no burning with urination.   Record review shows that patient has been seen several times in the last 6 months for similar chest pain and shortness of breath symptoms sometimes associated with URI and sometimes without.  She has had associated shortness of breath during these visits as well.  Patient has had numerous chest pain workups in the emergency department which have all been negative.  Additionally, she reports she was evaluated by cardiologist and diagnosed with musculoskeletal pain.  I am unable to find her cardiology visit in epic.   The history is provided by the patient and medical records. No language interpreter was used.    Past Medical History:  Diagnosis Date  . Blood transfusion without reported diagnosis    no infusion reaction  . Tuberculosis    inactive    Patient Active  Problem List   Diagnosis Date Noted  . S/P cesarean section 04/05/2015  . Unspecified vitamin D deficiency 10/27/2013    Past Surgical History:  Procedure Laterality Date  . CESAREAN SECTION N/A 04/04/2015   Procedure: CESAREAN SECTION;  Surgeon: Brock Bad, MD;  Location: [facility];  Service: Obstetrics;  Laterality: N/A;  . DILATION AND CURETTAGE OF UTERUS    . NOSE SURGERY       OB History    Gravida  4   Para  4   Term  4   Preterm      AB      Living  4     SAB      TAB      Ectopic      Multiple  0   Live Births  4            Home Medications    Prior to Admission medications   Medication Sig Start Date End Date Taking? Authorizing Provider  cephALEXin (KEFLEX) 500 MG capsule Take 1 capsule (500 mg total) by mouth 4 (four) times daily. 02/12/18   Kamryn Gauthier, Dahlia Client, PA-C  diazepam (VALIUM) 5 MG tablet Take 1 tablet (5 mg total) every 6 (six) hours as needed by mouth for muscle spasms. Patient not taking: Reported on 10/26/2017 09/06/17   Gilda Crease, MD  fluticasone [facility]) 50 MCG/ACT nasal spray Place 1 spray into both nostrils daily. 12/15/17   Maxwell Caul, PA-C  ibuprofen (ADVIL,MOTRIN) 800 MG tablet Take 1 tablet (800 mg total)  3 (three) times daily by mouth. Patient not taking: Reported on 10/26/2017 09/06/17   Gilda Crease, MD  meloxicam (MOBIC) 15 MG tablet Take 1 tablet (15 mg total) by mouth daily. Patient not taking: Reported on 10/26/2017 12/02/16   Bethel Born, PA-C  methocarbamol (ROBAXIN) 500 MG tablet Take 1 tablet (500 mg total) by mouth at bedtime and may repeat dose one time if needed. Patient not taking: Reported on 10/26/2017 12/02/16   Bethel Born, PA-C  metroNIDAZOLE (FLAGYL) 500 MG tablet Take 1 tablet (500 mg total) by mouth 2 (two) times daily. 02/12/18   Andren Bethea, Dahlia Client, PA-C  omeprazole (PRILOSEC) 20 MG capsule Take 1 capsule (20 mg total) by mouth 2 (two) times daily before a meal  for 14 days. 10/26/17 11/09/17  Michela Pitcher A, PA-C  traMADol (ULTRAM) 50 MG tablet Take 1 tablet (50 mg total) by mouth every 12 (twelve) hours as needed. Patient not taking: Reported on 10/26/2017 02/14/16   Tomasita Crumble, MD    Family History Family History  Problem Relation Age of Onset  . Diabetes Mother   . Hypertension Mother     Social History Social History   Tobacco Use  . Smoking status: Never Smoker  . Smokeless tobacco: Never Used  Substance Use Topics  . Alcohol use: No    Alcohol/week: 0.0 oz  . Drug use: No     Allergies   Patient has no known allergies.   Review of Systems Review of Systems  Constitutional: Negative for appetite change, diaphoresis, fatigue, fever and unexpected weight change.  HENT: Negative for mouth sores.   Eyes: Negative for visual disturbance.  Respiratory: Positive for chest tightness and shortness of breath. Negative for cough and wheezing.   Cardiovascular: Positive for chest pain.  Gastrointestinal: Positive for abdominal pain. Negative for constipation, diarrhea, nausea and vomiting.  Endocrine: Negative for polydipsia, polyphagia and polyuria.  Genitourinary: Positive for vaginal discharge. Negative for dysuria, frequency, hematuria and urgency.  Musculoskeletal: Negative for back pain and neck stiffness.  Skin: Negative for rash.  Allergic/Immunologic: Negative for immunocompromised state.  Neurological: Negative for syncope, light-headedness and headaches.  Hematological: Does not bruise/bleed easily.  Psychiatric/Behavioral: Negative for sleep disturbance. The patient is not nervous/anxious.      Physical Exam Updated Vital Signs BP 114/76   Pulse 71   Temp 98.8 F (37.1 C) (Oral)   Resp 16   Ht 5\' 6"  (1.676 m)   Wt 76.2 kg (168 lb)   SpO2 99%   BMI 27.12 kg/m   Physical Exam  Constitutional: She appears well-developed and well-nourished. No distress.  Awake, alert, nontoxic appearance  HENT:  Head:  Normocephalic and atraumatic.  Mouth/Throat: Oropharynx is clear and moist. No oropharyngeal exudate.  Eyes: Conjunctivae are normal. No scleral icterus.  Neck: Normal range of motion. Neck supple.  Cardiovascular: Normal rate, regular rhythm, normal heart sounds and intact distal pulses.  No murmur heard. Pulmonary/Chest: Effort normal and breath sounds normal. No respiratory distress. She has no wheezes.  Equal chest expansion  Abdominal: Soft. Bowel sounds are normal. She exhibits no mass. There is no tenderness. There is no rebound and no guarding. Hernia confirmed negative in the right inguinal area and confirmed negative in the left inguinal area.  Genitourinary: Uterus normal. No labial fusion. There is no rash, tenderness or lesion on the right labia. There is no rash, tenderness or lesion on the left labia. Uterus is not deviated, not enlarged, not fixed and not  tender. Cervix exhibits friability. Cervix exhibits no motion tenderness and no discharge. Right adnexum displays no mass, no tenderness and no fullness. Left adnexum displays tenderness. Left adnexum displays no mass and no fullness. No erythema, tenderness or bleeding in the vagina. No foreign body in the vagina. No signs of injury around the vagina. Vaginal discharge (Thin, white) found.  Genitourinary Comments: Mild tenderness to palpation of the left adnexa.  No palpable mass or fullness.  Cervical motion tenderness.  Musculoskeletal: Normal range of motion. She exhibits no edema.  Lymphadenopathy:       Right: No inguinal adenopathy present.       Left: No inguinal adenopathy present.  Neurological: She is alert.  Speech is clear and goal oriented Moves extremities without ataxia  Skin: Skin is warm and dry. She is not diaphoretic. No erythema.  Psychiatric: She has a normal mood and affect.  Nursing note and vitals reviewed.    ED Treatments / Results  Labs (all labs ordered are listed, but only abnormal results are  displayed) Labs Reviewed  WET PREP, GENITAL - Abnormal; Notable for the following components:      Result Value   Clue Cells Wet Prep HPF POC PRESENT (*)    WBC, Wet Prep HPF POC MANY (*)    All other components within normal limits  COMPREHENSIVE METABOLIC PANEL - Abnormal; Notable for the following components:   Glucose, Bld 117 (*)    All other components within normal limits  CBC - Abnormal; Notable for the following components:   Hemoglobin 11.9 (*)    All other components within normal limits  URINALYSIS, ROUTINE W REFLEX MICROSCOPIC - Abnormal; Notable for the following components:   APPearance CLOUDY (*)    Hgb urine dipstick SMALL (*)    Leukocytes, UA MODERATE (*)    Bacteria, UA RARE (*)    Squamous Epithelial / LPF 6-30 (*)    All other components within normal limits  URINE CULTURE  LIPASE, BLOOD  I-STAT BETA HCG BLOOD, ED ([facility], [facility], [facility] ONLY)  I-STAT TROPONIN, ED  I-STAT BETA HCG BLOOD, ED ([facility], [facility], [facility] ONLY)  GC/CHLAMYDIA PROBE AMP ([facility]) NOT AT [facility]    EKG EKG Interpretation  Date/Time:  Friday February 11 2018 19:44:49 EDT Ventricular Rate:  77 PR Interval:  140 QRS Duration: 84 QT Interval:  366 QTC Calculation: 414 R Axis:   80 Text Interpretation:  Normal sinus rhythm Normal ECG No significant change was found Confirmed by Glynn Octave [phone]) on 02/12/2018 12:40:37 AM   Radiology Dg Chest 2 View  Result Date: 02/12/2018 CLINICAL DATA:  Shortness of breath. EXAM: CHEST - 2 VIEW COMPARISON:  10/26/2017 FINDINGS: The cardiomediastinal contours are normal. The lungs are clear. Pulmonary vasculature is normal. No consolidation, pleural effusion, or pneumothorax. No acute osseous abnormalities are seen. IMPRESSION: No acute pulmonary process. Electronically Signed   By: Rubye Oaks M.D.   On: 02/12/2018 00:48   US Transvaginal Non-ob  Result Date: 02/12/2018 CLINICAL DATA:  Left adnexal pain for 3 months. EXAM: TRANSABDOMINAL AND TRANSVAGINAL  ULTRASOUND OF PELVIS DOPPLER ULTRASOUND OF OVARIES TECHNIQUE: Both transabdominal and transvaginal ultrasound examinations of the pelvis were performed. Transabdominal technique was performed for global imaging of the pelvis including uterus, ovaries, adnexal regions, and pelvic cul-de-sac. It was necessary to proceed with endovaginal exam following the transabdominal exam to visualize the ovaries and adnexa. Color and duplex Doppler ultrasound was utilized to evaluate blood flow to the ovaries. COMPARISON:  Pelvic  ultrasound 10/19/2015 FINDINGS: Uterus Measurements: 8.2 x 4.0 x 6.0 cm. No fibroids or other mass visualized. Endometrium Thickness: 8.5 mm. Linear echogenic IUD appropriately positioned in the endometrium. Right ovary Measurements: 3.4 x 1.5 x 1.7 cm. Normal appearance with physiologic follicles and normal blood flow. No adnexal mass. Left ovary Measurements: 4.5 x 2.2 x 2.7 cm. Normal appearance with physiologic follicles and normal blood flow. No adnexal mass. Pulsed Doppler evaluation of both ovaries demonstrates normal low-resistance arterial and venous waveforms. Other findings Small amount of free fluid in the pelvis is likely physiologic. There are enlarged vessels within both adnexa. IMPRESSION: 1. Normal sonographic appearance of the uterus and ovaries. 2. Enlarged vessels in both adnexa can be seen with pelvic congestion syndrome. 3. IUD in appropriate position. Electronically Signed   By: Rubye Oaks M.D.   On: 02/12/2018 05:46   US Pelvis Complete  Result Date: 02/12/2018 CLINICAL DATA:  Left adnexal pain for 3 months. EXAM: TRANSABDOMINAL AND TRANSVAGINAL ULTRASOUND OF PELVIS DOPPLER ULTRASOUND OF OVARIES TECHNIQUE: Both transabdominal and transvaginal ultrasound examinations of the pelvis were performed. Transabdominal technique was performed for global imaging of the pelvis including uterus, ovaries, adnexal regions, and pelvic cul-de-sac. It was necessary to proceed with  endovaginal exam following the transabdominal exam to visualize the ovaries and adnexa. Color and duplex Doppler ultrasound was utilized to evaluate blood flow to the ovaries. COMPARISON:  Pelvic ultrasound 10/19/2015 FINDINGS: Uterus Measurements: 8.2 x 4.0 x 6.0 cm. No fibroids or other mass visualized. Endometrium Thickness: 8.5 mm. Linear echogenic IUD appropriately positioned in the endometrium. Right ovary Measurements: 3.4 x 1.5 x 1.7 cm. Normal appearance with physiologic follicles and normal blood flow. No adnexal mass. Left ovary Measurements: 4.5 x 2.2 x 2.7 cm. Normal appearance with physiologic follicles and normal blood flow. No adnexal mass. Pulsed Doppler evaluation of both ovaries demonstrates normal low-resistance arterial and venous waveforms. Other findings Small amount of free fluid in the pelvis is likely physiologic. There are enlarged vessels within both adnexa. IMPRESSION: 1. Normal sonographic appearance of the uterus and ovaries. 2. Enlarged vessels in both adnexa can be seen with pelvic congestion syndrome. 3. IUD in appropriate position. Electronically Signed   By: Rubye Oaks M.D.   On: 02/12/2018 05:46   [place] Art/ven Flow Abd Pelv Doppler  Result Date: 02/12/2018 CLINICAL DATA:  Left adnexal pain for 3 months. EXAM: TRANSABDOMINAL AND TRANSVAGINAL ULTRASOUND OF PELVIS DOPPLER ULTRASOUND OF OVARIES TECHNIQUE: Both transabdominal and transvaginal ultrasound examinations of the pelvis were performed. Transabdominal technique was performed for global imaging of the pelvis including uterus, ovaries, adnexal regions, and pelvic cul-de-sac. It was necessary to proceed with endovaginal exam following the transabdominal exam to visualize the ovaries and adnexa. Color and duplex Doppler ultrasound was utilized to evaluate blood flow to the ovaries. COMPARISON:  Pelvic ultrasound 10/19/2015 FINDINGS: Uterus Measurements: 8.2 x 4.0 x 6.0 cm. No fibroids or other mass visualized. Endometrium  Thickness: 8.5 mm. Linear echogenic IUD appropriately positioned in the endometrium. Right ovary Measurements: 3.4 x 1.5 x 1.7 cm. Normal appearance with physiologic follicles and normal blood flow. No adnexal mass. Left ovary Measurements: 4.5 x 2.2 x 2.7 cm. Normal appearance with physiologic follicles and normal blood flow. No adnexal mass. Pulsed Doppler evaluation of both ovaries demonstrates normal low-resistance arterial and venous waveforms. Other findings Small amount of free fluid in the pelvis is likely physiologic. There are enlarged vessels within both adnexa. IMPRESSION: 1. Normal sonographic appearance of the uterus and ovaries. 2.  Enlarged vessels in both adnexa can be seen with pelvic congestion syndrome. 3. IUD in appropriate position. Electronically Signed   By: Rubye OaksMelanie  Ehinger M.D.   On: 02/12/2018 05:46    Procedures Procedures (including critical care time)  Medications Ordered in ED Medications - No data to display   Initial Impression / Assessment and Plan / ED Course  I have reviewed the triage vital signs and the nursing notes.  Pertinent labs & imaging results that were available during my care of the patient were reviewed by me and considered in my medical decision making (see chart for details).     Presents with chest pain shortness of breath.  Record review shows that she has had numerous workups in the past.  Today her troponin is negative and her EKG is nonischemic.  No evidence of acute coronary syndrome.  No leukocytosis.  Patient with mild anemia however I do not believe that she has symptomatic anemia.  She is without tachycardia, hemoptysis or hypoxia.  Doubt pulmonary embolism.  Chest x-ray without evidence of pneumonia, pneumothorax or pulmonary edema.  I personally evaluated these images.    Patient also with lower abdominal pain, urinary frequency and urgency vaginal discharge.  Pelvic exam without cervical motion tenderness.  No evidence of pelvic  inflammatory disease.  Patient does have IUD.  Ultrasound without evidence of tubo-ovarian abscess.  Enlarged vessels of both adnexa with potential pelvic congestion syndrome.  Patient urine does have leukocytes and white blood cells.  Will treat for urinary tract infection as well.  Patient is to follow-up with her OB/GYN for further evaluation of her symptoms.  Discussed reasons to return immediately to the emergency department.  Patient states understanding and is in agreement with the plan.  Final Clinical Impressions(s) / ED Diagnoses   Final diagnoses:  Acute cystitis without hematuria  BV (bacterial vaginosis)  Chest pain, unspecified type  SOB (shortness of breath)    ED Discharge Orders        Ordered    cephALEXin (KEFLEX) 500 MG capsule  4 times daily     02/12/18 0607    metroNIDAZOLE (FLAGYL) 500 MG tablet  2 times daily     02/12/18 0607       Akyra Bouchie, Dahlia ClientHannah, PA-C 02/12/18 [id number]    Glynn Octaveancour, Stephen, MD 02/12/18 [phone]

## 2018-02-12 NOTE — ED Notes (Signed)
Patient transported to Ultrasound 

## 2018-02-12 NOTE — Discharge Instructions (Addendum)
1. Medications: Keflex, Flagyl, usual home medications 2. Treatment: rest, drink plenty of fluids, advance diet slowly 3. Follow Up: Please followup with your primary doctor in 2 days for discussion of your diagnoses and further evaluation after today's visit; if you do not have a primary care doctor use the resource guide provided to find one; Please return to the ER for persistent vomiting, high fevers or worsening symptoms

## 2018-02-12 NOTE — ED Notes (Signed)
Pt verbalizes understanding of d/c instructions. Pt received prescriptions. Pt ambulatory at d/c with all belongings.  

## 2018-02-13 LAB — URINE CULTURE

## 2018-02-14 LAB — GC/CHLAMYDIA PROBE AMP (~~LOC~~) NOT AT ARMC
Chlamydia: NEGATIVE
Neisseria Gonorrhea: NEGATIVE

## 2018-11-10 ENCOUNTER — Inpatient Hospital Stay (HOSPITAL_COMMUNITY)
Admission: AD | Admit: 2018-11-10 | Discharge: 2018-11-10 | Disposition: A | Discharge status: Home / Self Care | Payer: Medicaid Other | Attending: Obstetrics and Gynecology | Admitting: Obstetrics and Gynecology

## 2018-11-10 ENCOUNTER — Encounter (HOSPITAL_COMMUNITY): Payer: Self-pay

## 2018-11-10 DIAGNOSIS — R350 Frequency of micturition: Secondary | ICD-10-CM

## 2018-11-10 DIAGNOSIS — R1030 Lower abdominal pain, unspecified: Secondary | ICD-10-CM | POA: Insufficient documentation

## 2018-11-10 DIAGNOSIS — N3001 Acute cystitis with hematuria: Secondary | ICD-10-CM

## 2018-11-10 DIAGNOSIS — K625 Hemorrhage of anus and rectum: Secondary | ICD-10-CM

## 2018-11-10 DIAGNOSIS — R102 Pelvic and perineal pain unspecified side: Secondary | ICD-10-CM

## 2018-11-10 DIAGNOSIS — Z3202 Encounter for pregnancy test, result negative: Secondary | ICD-10-CM | POA: Insufficient documentation

## 2018-11-10 DIAGNOSIS — R3989 Other symptoms and signs involving the genitourinary system: Secondary | ICD-10-CM

## 2018-11-10 LAB — URINALYSIS, ROUTINE W REFLEX MICROSCOPIC
Bilirubin Urine: NEGATIVE
Glucose, UA: NEGATIVE mg/dL
Ketones, ur: NEGATIVE mg/dL
Nitrite: NEGATIVE
PROTEIN: NEGATIVE mg/dL
RBC / HPF: >50 RBC/hpf — ABNORMAL HIGH (ref 0–5)
SPECIFIC GRAVITY, URINE: 1.021 (ref 1.005–1.030)
WBC, UA: >50 WBC/hpf — ABNORMAL HIGH (ref 0–5)
pH: 6 (ref 5.0–8.0)

## 2018-11-10 LAB — CBC WITH DIFFERENTIAL/PLATELET
Basophils Absolute: 0 10*3/uL (ref 0.0–0.1)
Basophils Relative: 1 %
EOS PCT: 2 %
Eosinophils Absolute: 0.1 10*3/uL (ref 0.0–0.5)
HCT: 37.1 % (ref 36.0–46.0)
HEMOGLOBIN: 12 g/dL (ref 12.0–15.0)
LYMPHS ABS: 2 10*3/uL (ref 0.7–4.0)
LYMPHS PCT: 36 %
MCH: 29.7 pg (ref 26.0–34.0)
MCHC: 32.3 g/dL (ref 30.0–36.0)
MCV: 91.8 fL (ref 80.0–100.0)
MONOS PCT: 5 %
Monocytes Absolute: 0.3 10*3/uL (ref 0.1–1.0)
NEUTROS PCT: 56 %
Neutro Abs: 3.2 10*3/uL (ref 1.7–7.7)
Platelets: 218 10*3/uL (ref 150–400)
RBC: 4.04 MIL/uL (ref 3.87–5.11)
RDW: 13.8 % (ref 11.5–15.5)
WBC: 5.7 10*3/uL (ref 4.0–10.5)
nRBC: 0 % (ref 0.0–0.2)

## 2018-11-10 LAB — WET PREP, GENITAL
CLUE CELLS WET PREP: NONE SEEN
Sperm: NONE SEEN
Trich, Wet Prep: NONE SEEN

## 2018-11-10 LAB — POCT PREGNANCY, URINE: PREG TEST UR: NEGATIVE

## 2018-11-10 MED ORDER — SULFAMETHOXAZOLE-TRIMETHOPRIM 800-160 MG PO TABS: 1.0000 | ORAL_TABLET | Freq: Two times a day (BID) | ORAL | 0 refills | Status: AC

## 2018-11-10 MED ORDER — IBUPROFEN 600 MG PO TABS
600.0000 mg | ORAL_TABLET | Freq: Once | ORAL | Status: AC
  Administered 2018-11-10: 600 mg via ORAL
  Filled 2018-11-10: qty 1

## 2018-11-10 MED ORDER — IBUPROFEN 600 MG PO TABS: 600.0000 mg | ORAL_TABLET | Freq: Four times a day (QID) | ORAL | 1 refills | Status: DC | PRN

## 2018-11-10 NOTE — MAU Note (Signed)
Pt reports lower abdominal cramping and back pain that started Friday. States she has had some blood in her stools for the last 3 days. Pt denies constipation or diarrhea. States her last BM was this morning. Denies vomiting. LMP: 10/30/2018.

## 2018-11-10 NOTE — MAU Provider Note (Signed)
Chief Complaint:  Abdominal Pain and Rectal Bleeding   First Provider Initiated Contact with Patient 11/10/18 2050     HPI: Chelsea Castaneda is a 38 y.o. [id number] who presents to maternity admissions reporting lower abdominal cramping and spotting for several days.  Just finished period last week.  Has also had some streaks of blood in her stool  Denies bowel problems.  Does not have a family doctor due to no insurance. . She reports vaginal bleeding, but not vaginal itching/burning, urinary symptoms, h/a, dizziness, n/v, or fever/chills.    Abdominal Pain  This is a new problem. The current episode started in the past 7 days. The onset quality is gradual. The problem occurs intermittently. The problem has been unchanged. The pain is located in the suprapubic region. The quality of the pain is cramping. The abdominal pain does not radiate. Associated symptoms include frequency and hematochezia. Pertinent negatives include no constipation, diarrhea, fever, headaches, nausea or vomiting. Nothing aggravates the pain. The pain is relieved by nothing. She has tried nothing for the symptoms.  Rectal Bleeding   The current episode started 3 to 5 days ago. The problem occurs frequently. The problem has been unchanged. The patient is experiencing no pain. The stool is described as streaked with blood. There was no prior successful therapy. There was no prior unsuccessful therapy. Associated symptoms include abdominal pain and vaginal bleeding. Pertinent negatives include no fever, no diarrhea, no nausea, no rectal pain, no vomiting, no vaginal discharge, no headaches, no coughing and no difficulty breathing. There were no sick contacts. She has received no recent medical care.   RN Note: Pt reports lower abdominal cramping and back pain that started Friday. States she has had some blood in her stools for the last 3 days. Pt denies constipation or diarrhea. States her last BM was this morning. Denies  vomiting. LMP: 10/30/2018.   Past Medical History: Past Medical History:  Diagnosis Date  . Blood transfusion without reported diagnosis    no infusion reaction  . Tuberculosis    inactive    Past obstetric history: OB History  Gravida Para Term Preterm AB Living  4 4 4     4   SAB TAB Ectopic Multiple Live Births        0 4    # Outcome Date GA Lbr Len/2nd Weight Sex Delivery Anes PTL Lv  4 Term 04/04/15 [redacted]w[redacted]d  3544 g F CS-Vac EPI  LIV  3 Term 12/19/13 [redacted]w[redacted]d 05:13 / 00:40 3436 g F Vag-Spont EPI  LIV  2 Term 02/18/11 [redacted]w[redacted]d  2863 g M Vag-Spont EPI N LIV  1 Term 12/22/09 [redacted]w[redacted]d  3629 g M Vag-Spont EPI N LIV    Past Surgical History: Past Surgical History:  Procedure Laterality Date  . CESAREAN SECTION N/A 04/04/2015   Procedure: CESAREAN SECTION;  Surgeon: Brock Bad, MD;  Location: [facility];  Service: Obstetrics;  Laterality: N/A;  . DILATION AND CURETTAGE OF UTERUS    . NOSE SURGERY      Family History: Family History  Problem Relation Age of Onset  . Diabetes Mother   . Hypertension Mother     Social History: Social History   Tobacco Use  . Smoking status: Never Smoker  . Smokeless tobacco: Never Used  Substance Use Topics  . Alcohol use: No    Alcohol/week: 0.0 standard drinks  . Drug use: No    Allergies: No Known Allergies  Meds:  Medications Prior to Admission  Medication  Sig Dispense Refill Last Dose  . cephALEXin (KEFLEX) 500 MG capsule Take 1 capsule (500 mg total) by mouth 4 (four) times daily. 40 capsule 0   . diazepam (VALIUM) 5 MG tablet Take 1 tablet (5 mg total) every 6 (six) hours as needed by mouth for muscle spasms. (Patient not taking: Reported on 10/26/2017) 15 tablet 0 Not Taking at Unknown time  . fluticasone (FLONASE) 50 MCG/ACT nasal spray Place 1 spray into both nostrils daily. 16 g 2   . ibuprofen (ADVIL,MOTRIN) 800 MG tablet Take 1 tablet (800 mg total) 3 (three) times daily by mouth. (Patient not taking: Reported on 10/26/2017)  21 tablet 0 Not Taking at Unknown time  . meloxicam (MOBIC) 15 MG tablet Take 1 tablet (15 mg total) by mouth daily. (Patient not taking: Reported on 10/26/2017) 10 tablet 0 Not Taking at Unknown time  . methocarbamol (ROBAXIN) 500 MG tablet Take 1 tablet (500 mg total) by mouth at bedtime and may repeat dose one time if needed. (Patient not taking: Reported on 10/26/2017) 10 tablet 0 Not Taking at Unknown time  . metroNIDAZOLE (FLAGYL) 500 MG tablet Take 1 tablet (500 mg total) by mouth 2 (two) times daily. 14 tablet 0   . omeprazole (PRILOSEC) 20 MG capsule Take 1 capsule (20 mg total) by mouth 2 (two) times daily before a meal for 14 days. 28 capsule 0   . traMADol (ULTRAM) 50 MG tablet Take 1 tablet (50 mg total) by mouth every 12 (twelve) hours as needed. (Patient not taking: Reported on 10/26/2017) 10 tablet 0 Not Taking at Unknown time    I have reviewed patient's Past Medical Hx, Surgical Hx, Family Hx, Social Hx, medications and allergies.  ROS:  Review of Systems  Constitutional: Negative for fever.  Respiratory: Negative for cough.   Gastrointestinal: Positive for abdominal pain and hematochezia. Negative for constipation, diarrhea, nausea, rectal pain and vomiting.  Genitourinary: Positive for frequency and vaginal bleeding. Negative for vaginal discharge.  Neurological: Negative for headaches.   Other systems negative     Physical Exam   Patient Vitals for the past 24 hrs:  BP Temp Temp src Pulse Resp SpO2 Height Weight  11/10/18 2031 115/75 98.7 F (37.1 C) Oral 85 18 99 % 5' 3.5" (1.613 m) 81.6 kg   Constitutional: Well-developed, well-nourished female in no acute distress.  Cardiovascular: normal rate and rhythm Respiratory: normal effort, no distress.  GI: Abd soft, non-tender.    Nondistended.  No rebound, No guarding.   MS: Extremities nontender, no edema, normal ROM Neurologic: Alert and oriented x 4.   Grossly nonfocal. GU: Neg CVAT.   + Tenderness over  bladder Skin:  Warm and Dry Psych:  Affect appropriate.  PELVIC EXAM: Cervix pink, visually closed, without lesion, scant blood streaked discharge, vaginal walls and external genitalia normal   IUD strings visible Bimanual exam: Cervix firm, anterior, neg CMT, uterus nontender, nonenlarged, adnexa without tenderness, enlargement, or mass      Labs: Results for orders placed or performed during the hospital encounter of 11/10/18 (from the past 24 hour(s))  Urinalysis, Routine w reflex microscopic     Status: Abnormal   Collection Time: 11/10/18  8:40 PM  Result Value Ref Range   Color, Urine YELLOW YELLOW   APPearance CLOUDY (A) CLEAR   Specific Gravity, Urine 1.021 1.005 - 1.030   pH 6.0 5.0 - 8.0   Glucose, UA NEGATIVE NEGATIVE mg/dL   Hgb urine dipstick LARGE (A) NEGATIVE  Bilirubin Urine NEGATIVE NEGATIVE   Ketones, ur NEGATIVE NEGATIVE mg/dL   Protein, ur NEGATIVE NEGATIVE mg/dL   Nitrite NEGATIVE NEGATIVE   Leukocytes, UA LARGE (A) NEGATIVE   RBC / HPF >50 (H) 0 - 5 RBC/hpf   WBC, UA >50 (H) 0 - 5 WBC/hpf   Bacteria, UA RARE (A) NONE SEEN   Squamous Epithelial / LPF 21-50 0 - 5   Mucus PRESENT   Pregnancy, urine POC     Status: None   Collection Time: 11/10/18  8:50 PM  Result Value Ref Range   Preg Test, Ur NEGATIVE NEGATIVE  Wet prep, genital     Status: Abnormal   Collection Time: 11/10/18  9:03 PM  Result Value Ref Range   Yeast Wet Prep HPF POC PRESENT (A) NONE SEEN   Trich, Wet Prep NONE SEEN NONE SEEN   Clue Cells Wet Prep HPF POC NONE SEEN NONE SEEN   WBC, Wet Prep HPF POC MANY (A) NONE SEEN   Sperm NONE SEEN   CBC with Differential/Platelet     Status: None   Collection Time: 11/10/18  9:24 PM  Result Value Ref Range   WBC 5.7 4.0 - 10.5 K/uL   RBC 4.04 3.87 - 5.11 MIL/uL   Hemoglobin 12.0 12.0 - 15.0 g/dL   HCT 16.137.1 09.636.0 - 04.546.0 %   MCV 91.8 80.0 - 100.0 fL   MCH 29.7 26.0 - 34.0 pg   MCHC 32.3 30.0 - 36.0 g/dL   RDW 40.913.8 81.111.5 - 91.415.5 %   Platelets  218 150 - 400 K/uL   nRBC 0.0 0.0 - 0.2 %   Neutrophils Relative % 56 %   Neutro Abs 3.2 1.7 - 7.7 K/uL   Lymphocytes Relative 36 %   Lymphs Abs 2.0 0.7 - 4.0 K/uL   Monocytes Relative 5 %   Monocytes Absolute 0.3 0.1 - 1.0 K/uL   Eosinophils Relative 2 %   Eosinophils Absolute 0.1 0.0 - 0.5 K/uL   Basophils Relative 1 %   Basophils Absolute 0.0 0.0 - 0.1 K/uL    Imaging:  No results found.  MAU Course/MDM: I have ordered labs as follows: UA, UPT, CBC  CBC showed no leukocytosis.  UA positive for signs of UTI Discussed will treat that presumptively  Referral phone numbers to two free clinics provided. Imaging ordered: none Results reviewed.    Treatments in MAU included Ibuprofen which provided  little  Relief, probably owing to the UTI being source.   Pt stable at time of discharge.  Assessment: Urinary frequency and leukocytes in urine, likely UTI Pelvic cramping Blood streaked stool, unclear etiology  Plan: Discharge home Recommend Follow up with primary doctor. Numbers to [facility] and [facility] given Rx sent for ibuprofen  for cramping Rx Bactrim DS for probable UTI  Encouraged to return here or to other Urgent Care/ED if she develops worsening of symptoms, increase in pain, fever, or other concerning symptoms.   Wynelle BourgeoisMarie Axten Pascucci CNM, MSN Certified Nurse-Midwife 11/10/2018 9:16 PM

## 2018-11-10 NOTE — Discharge Instructions (Signed)
Rectal Bleeding  Rectal bleeding is when blood comes out of the opening of the butt (anus). People with this kind of bleeding may notice bright red blood in their underwear or in the toilet after they poop (have a bowel movement). They may also have dark red or black poop (stool). Rectal bleeding is often a sign that something is wrong. It needs to be checked by a doctor. Follow these instructions at home: Watch for any changes in your condition. Take these actions to help with bleeding and discomfort:  Eat a diet that is high in fiber. This will keep your poop soft so it is easier for you to poop without pushing too hard. Ask your doctor to tell you what foods and drinks are high in fiber.  Drink enough fluid to keep your pee (urine) clear or pale yellow. This also helps keep your poop soft.  Try taking a warm bath. This may help with pain.  Keep all follow-up visits as told by your doctor. This is important. Get help right away if:  You have new bleeding.  You have more bleeding than before.  You have black or dark red poop.  You throw up (vomit) blood or something that looks like coffee grounds.  You have pain or tenderness in your belly (abdomen).  You have a fever.  You feel weak.  You feel sick to your stomach (nauseous).  You pass out (faint).  You have very bad pain in your butt.  You cannot poop. This information is not intended to replace advice given to you by your health care provider. Make sure you discuss any questions you have with your health care provider. Document Released: 07/01/2011 Document Revised: 03/26/2016 Document Reviewed: 12/15/2015 Elsevier Interactive Patient Education  2019 [employer] Urinary Tract Infection, Adult A urinary tract infection (UTI) is an infection of any part of the urinary tract. The urinary tract includes:  The kidneys.  The ureters.  The bladder.  The urethra. These organs make, store, and get rid of pee (urine) in  the body. What are the causes? This is caused by germs (bacteria) in your genital area. These germs grow and cause swelling (inflammation) of your urinary tract. What increases the risk? You are more likely to develop this condition if:  You have a small, thin tube (catheter) to drain pee.  You cannot control when you pee or poop (incontinence).  You are female, and: ? You use these methods to prevent pregnancy: ? A medicine that kills sperm (spermicide). ? A device that blocks sperm (diaphragm). ? You have low levels of a female hormone (estrogen). ? You are pregnant.  You have genes that add to your risk.  You are sexually active.  You take antibiotic medicines.  You have trouble peeing because of: ? A prostate that is bigger than normal, if you are female. ? A blockage in the part of your body that drains pee from the bladder (urethra). ? A kidney stone. ? A nerve condition that affects your bladder (neurogenic bladder). ? Not getting enough to drink. ? Not peeing often enough.  You have other conditions, such as: ? Diabetes. ? A weak disease-fighting system (immune system). ? Sickle cell disease. ? Gout. ? Injury of the spine. What are the signs or symptoms? Symptoms of this condition include:  Needing to pee right away (urgently).  Peeing often.  Peeing small amounts often.  Pain or burning when peeing.  Blood in the pee.  Pee that  smells bad or not like normal.  Trouble peeing.  Pee that is cloudy.  Fluid coming from the vagina, if you are female.  Pain in the belly or lower back. Other symptoms include:  Throwing up (vomiting).  No urge to eat.  Feeling mixed up (confused).  Being tired and grouchy (irritable).  A fever.  Watery poop (diarrhea). How is this treated? This condition may be treated with:  Antibiotic medicine.  Other medicines.  Drinking enough water. Follow these instructions at home:  Medicines  Take  over-the-counter and prescription medicines only as told by your doctor.  If you were prescribed an antibiotic medicine, take it as told by your doctor. Do not stop taking it even if you start to feel better. General instructions  Make sure you: ? Pee until your bladder is empty. ? Do not hold pee for a long time. ? Empty your bladder after sex. ? Wipe from front to back after pooping if you are a female. Use each tissue one time when you wipe.  Drink enough fluid to keep your pee pale yellow.  Keep all follow-up visits as told by your doctor. This is important. Contact a doctor if:  You do not get better after 1-2 days.  Your symptoms go away and then come back. Get help right away if:  You have very bad back pain.  You have very bad pain in your lower belly.  You have a fever.  You are sick to your stomach (nauseous).  You are throwing up. Summary  A urinary tract infection (UTI) is an infection of any part of the urinary tract.  This condition is caused by germs in your genital area.  There are many risk factors for a UTI. These include having a small, thin tube to drain pee and not being able to control when you pee or poop.  Treatment includes antibiotic medicines for germs.  Drink enough fluid to keep your pee pale yellow. This information is not intended to replace advice given to you by your health care provider. Make sure you discuss any questions you have with your health care provider. Document Released: 04/06/2008 Document Revised: 04/28/2018 Document Reviewed: 04/28/2018 Elsevier Interactive Patient Education  2019 [employer].

## 2018-11-11 ENCOUNTER — Emergency Department (HOSPITAL_COMMUNITY): Payer: Self-pay

## 2018-11-11 ENCOUNTER — Other Ambulatory Visit: Payer: Self-pay

## 2018-11-11 ENCOUNTER — Emergency Department (HOSPITAL_COMMUNITY)
Admission: EM | Admit: 2018-11-11 | Discharge: 2018-11-11 | Disposition: A | Discharge status: Home / Self Care | Payer: Self-pay | Attending: Emergency Medicine | Admitting: Emergency Medicine

## 2018-11-11 DIAGNOSIS — R1084 Generalized abdominal pain: Secondary | ICD-10-CM | POA: Insufficient documentation

## 2018-11-11 LAB — COMPREHENSIVE METABOLIC PANEL
ALT: 16 U/L (ref 0–44)
ANION GAP: 7 (ref 5–15)
AST: 20 U/L (ref 15–41)
Albumin: 3.8 g/dL (ref 3.5–5.0)
Alkaline Phosphatase: 50 U/L (ref 38–126)
BILIRUBIN TOTAL: 0.6 mg/dL (ref 0.3–1.2)
BUN: 11 mg/dL (ref 6–20)
CO2: 24 mmol/L (ref 22–32)
Calcium: 9 mg/dL (ref 8.9–10.3)
Chloride: 109 mmol/L (ref 98–111)
Creatinine, Ser: 0.74 mg/dL (ref 0.44–1.00)
GFR calc non Af Amer: >60 mL/min (ref >60)
Glucose, Bld: 101 mg/dL — ABNORMAL HIGH (ref 70–99)
Potassium: 3.9 mmol/L (ref 3.5–5.1)
SODIUM: 140 mmol/L (ref 135–145)
TOTAL PROTEIN: 6.9 g/dL (ref 6.5–8.1)

## 2018-11-11 LAB — CBC WITH DIFFERENTIAL/PLATELET
Abs Immature Granulocytes: 0.01 10*3/uL (ref 0.00–0.07)
BASOS ABS: 0 10*3/uL (ref 0.0–0.1)
BASOS PCT: 1 %
EOS ABS: 0.1 10*3/uL (ref 0.0–0.5)
Eosinophils Relative: 2 %
HCT: 40.1 % (ref 36.0–46.0)
Hemoglobin: 12.8 g/dL (ref 12.0–15.0)
IMMATURE GRANULOCYTES: 0 %
Lymphocytes Relative: 34 %
Lymphs Abs: 1.6 10*3/uL (ref 0.7–4.0)
MCH: 29.7 pg (ref 26.0–34.0)
MCHC: 31.9 g/dL (ref 30.0–36.0)
MCV: 93 fL (ref 80.0–100.0)
Monocytes Absolute: 0.4 10*3/uL (ref 0.1–1.0)
Monocytes Relative: 8 %
NRBC: 0 % (ref 0.0–0.2)
Neutro Abs: 2.5 10*3/uL (ref 1.7–7.7)
Neutrophils Relative %: 55 %
PLATELETS: 226 10*3/uL (ref 150–400)
RBC: 4.31 MIL/uL (ref 3.87–5.11)
RDW: 13.5 % (ref 11.5–15.5)
WBC: 4.6 10*3/uL (ref 4.0–10.5)

## 2018-11-11 LAB — I-STAT BETA HCG BLOOD, ED (MC, WL, AP ONLY)

## 2018-11-11 LAB — GC/CHLAMYDIA PROBE AMP (~~LOC~~) NOT AT ARMC
Chlamydia: NEGATIVE
Neisseria Gonorrhea: NEGATIVE

## 2018-11-11 LAB — LIPASE, BLOOD: Lipase: 25 U/L (ref 11–51)

## 2018-11-11 MED ORDER — SODIUM CHLORIDE 0.9 % IV BOLUS
1000.0000 mL | Freq: Once | INTRAVENOUS | Status: AC
  Administered 2018-11-11: 1000 mL via INTRAVENOUS

## 2018-11-11 MED ORDER — IOHEXOL 300 MG/ML  SOLN
100.0000 mL | Freq: Once | INTRAMUSCULAR | Status: AC | PRN
  Administered 2018-11-11: 100 mL via INTRAVENOUS

## 2018-11-11 NOTE — ED Notes (Signed)
Patient transported to CT 

## 2018-11-11 NOTE — Discharge Instructions (Signed)
Follow up with a primary care provider for recheck. Return to ER for new or worsening symptoms. Follow up with GI, referral given, for further evaluation of blood in your stool.

## 2018-11-11 NOTE — ED Triage Notes (Signed)
Pt complaining of abdominal cramping and spotty vaginal bleeding for one week. Pt reports that the cramping radiates to her back. Stated that she went went to [facility] and they told her she should follow up with gastro. Pt reports that she also has noticed some blood in her stool for three days. LMP 12/29.

## 2018-11-11 NOTE — ED Provider Notes (Signed)
[facility] EMERGENCY DEPARTMENT Provider Note   CSN: [id number] Arrival date & time: 11/11/18  5102     History   Chief Complaint Chief Complaint  Patient presents with  . Abdominal Pain    HPI Chelsea Castaneda is a 38 y.o. female.  38 year old female presents with complaint of pelvic abdominal cramping which radiates to diffuse abdominal area and to her low back.  Symptoms started 1 week ago.  Patient also noted vaginal bleeding and reports that she had blood in the "front as well as the back" of her underwear a few days ago as well as blood on the toilet paper with wiping and blood in her stools.  Blood is noted to be bright red, scant.  Patient denies any blood in her stools or underwear today.  Patient was evaluated at [facility] yesterday for same, had help with exam showing small amount of blood in the vaginal vault and presence of IUD strings.  Patient was advised to follow-up with GI or go to ER for worsening symptoms.  Patient was given prescription for Septra for possible urinary tract infection and advised to take Motrin for her pain.  Patient states that the Motrin seems to be making her pain worse.  Reports nausea, denies vomiting.  Prior abdominal surgeries include C-section.  No other complaints or concerns.     Past Medical History:  Diagnosis Date  . Blood transfusion without reported diagnosis    no infusion reaction  . Tuberculosis    inactive    Patient Active Problem List   Diagnosis Date Noted  . S/P cesarean section 04/05/2015  . Unspecified vitamin D deficiency 10/27/2013    Past Surgical History:  Procedure Laterality Date  . CESAREAN SECTION N/A 04/04/2015   Procedure: CESAREAN SECTION;  Surgeon: Brock Bad, MD;  Location: [facility];  Service: Obstetrics;  Laterality: N/A;  . DILATION AND CURETTAGE OF UTERUS    . NOSE SURGERY       OB History    Gravida  4   Para  4   Term  4   Preterm      AB      Living    4     SAB      TAB      Ectopic      Multiple  0   Live Births  4            Home Medications    Prior to Admission medications   Medication Sig Start Date End Date Taking? Authorizing Provider  diazepam (VALIUM) 5 MG tablet Take 1 tablet (5 mg total) every 6 (six) hours as needed by mouth for muscle spasms. Patient not taking: Reported on 10/26/2017 09/06/17   Gilda Crease, MD  fluticasone [facility]) 50 MCG/ACT nasal spray Place 1 spray into both nostrils daily. Patient not taking: Reported on 11/11/2018 12/15/17   Graciella Freer A, PA-C  ibuprofen (ADVIL,MOTRIN) 600 MG tablet Take 1 tablet (600 mg total) by mouth every 6 (six) hours as needed. Patient not taking: Reported on 11/11/2018 11/10/18   Aviva Signs, CNM  meloxicam (MOBIC) 15 MG tablet Take 1 tablet (15 mg total) by mouth daily. Patient not taking: Reported on 10/26/2017 12/02/16   Bethel Born, PA-C  methocarbamol (ROBAXIN) 500 MG tablet Take 1 tablet (500 mg total) by mouth at bedtime and may repeat dose one time if needed. Patient not taking: Reported on 10/26/2017 12/02/16   Jefm Bryant,  Jory SimsKelly Marie, PA-C  omeprazole (PRILOSEC) 20 MG capsule Take 1 capsule (20 mg total) by mouth 2 (two) times daily before a meal for 14 days. Patient not taking: Reported on 11/11/2018 10/26/17 11/09/17  Michela PitcherFawze, Mina A, PA-C  sulfamethoxazole-trimethoprim (BACTRIM DS,SEPTRA DS) 800-160 MG tablet Take 1 tablet by mouth 2 (two) times daily for 3 days. 11/10/18 11/13/18  Aviva SignsWilliams, Marie L, CNM  traMADol (ULTRAM) 50 MG tablet Take 1 tablet (50 mg total) by mouth every 12 (twelve) hours as needed. Patient not taking: Reported on 10/26/2017 02/14/16   Tomasita Crumbleni, Adeleke, MD    Family History Family History  Problem Relation Age of Onset  . Diabetes Mother   . Hypertension Mother     Social History Social History   Tobacco Use  . Smoking status: Never Smoker  . Smokeless tobacco: Never Used  Substance Use Topics  . Alcohol use:  No    Alcohol/week: 0.0 standard drinks  . Drug use: No     Allergies   Patient has no known allergies.   Review of Systems Review of Systems  Constitutional: Negative for chills and fever.  Respiratory: Negative for shortness of breath.   Cardiovascular: Negative for chest pain.  Gastrointestinal: Positive for abdominal pain, blood in stool and nausea. Negative for abdominal distention, constipation, diarrhea and vomiting.  Genitourinary: Positive for vaginal bleeding. Negative for dysuria, frequency and urgency.  Musculoskeletal: Positive for back pain.  Skin: Negative for rash and wound.  Allergic/Immunologic: Negative for immunocompromised state.  Neurological: Negative for dizziness and weakness.  Hematological: Does not bruise/bleed easily.  Psychiatric/Behavioral: Negative for confusion.  All other systems reviewed and are negative.    Physical Exam Updated Vital Signs BP 119/76   Pulse 72   Temp 98 F (36.7 C) (Oral)   Resp 18   Ht 5' 3.5" (1.613 m)   Wt 81.6 kg   LMP 10/30/2018   SpO2 100%   BMI 31.39 kg/m   Physical Exam Vitals signs and nursing note reviewed.  Constitutional:      General: She is not in acute distress.    Appearance: She is well-developed. She is not diaphoretic.  HENT:     Head: Normocephalic and atraumatic.  Cardiovascular:     Rate and Rhythm: Normal rate and regular rhythm.  Pulmonary:     Effort: Pulmonary effort is normal.     Breath sounds: Normal breath sounds.  Abdominal:     Palpations: Abdomen is soft.     Tenderness: There is generalized abdominal tenderness. There is no right CVA tenderness or left CVA tenderness.  Musculoskeletal:     Lumbar back: Normal. She exhibits no tenderness and no bony tenderness.  Skin:    General: Skin is warm and dry.     Findings: No rash.  Neurological:     Mental Status: She is alert and oriented to person, place, and time.  Psychiatric:        Behavior: Behavior normal.       ED Treatments / Results  Labs (all labs ordered are listed, but only abnormal results are displayed) Labs Reviewed  COMPREHENSIVE METABOLIC PANEL - Abnormal; Notable for the following components:      Result Value   Glucose, Bld 101 (*)    All other components within normal limits  CBC WITH DIFFERENTIAL/PLATELET  LIPASE, BLOOD  I-STAT BETA HCG BLOOD, ED ([facility], [facility], [facility] ONLY)    EKG None  Radiology Ct Abdomen Pelvis W Contrast  Result Date: 11/11/2018  CLINICAL DATA:  Lower abdominal pain EXAM: CT ABDOMEN AND PELVIS WITH CONTRAST TECHNIQUE: Multidetector CT imaging of the abdomen and pelvis was performed using the standard protocol following bolus administration of intravenous contrast. CONTRAST:  100mL OMNIPAQUE IOHEXOL 300 MG/ML  SOLN COMPARISON:  February 14, 2016 FINDINGS: Lower chest: Lung bases are clear. Hepatobiliary: No focal liver lesions are appreciable. There is slight fatty infiltration near the fissure for the ligamentum teres. Gallbladder wall is not appreciably thickened. There is no biliary duct dilatation. Pancreas: No pancreatic mass or inflammatory focus. Spleen: No splenic lesions are evident. Adrenals/Urinary Tract: Adrenals bilaterally appear unremarkable. Kidneys bilaterally show no evident mass or hydronephrosis on either side. There is no evident renal or ureteral calculus on either side. Urinary bladder is midline with wall thickness within normal limits. Stomach/Bowel: There is no appreciable bowel wall or mesenteric thickening. There is no appreciable bowel obstruction. There is no free air or portal venous air. Vascular/Lymphatic: There is no appreciable abdominal aortic aneurysm. No vascular lesions are evident. There is no appreciable adenopathy in the abdomen or pelvis. Reproductive: Uterus is anteverted. Intrauterine device is positioned within the endometrium. There is an apparent dominant follicle arising from the right ovary measuring 2.4 x 2.0 cm. No other  extrauterine pelvic or adnexal mass is seen. There is a slight amount of fluid in the cul-de-sac which may be in the physiologic range. Other: Appendix measures 7 mm in diameter which is upper normal. There is no periappendiceal region fluid or inflammatory change. No appendiculolith evident. No abscess or ascites evident in the abdomen or pelvis. There is a minimal ventral hernia containing only fat. Musculoskeletal: There are no blastic or lytic bone lesions. There is no intramuscular or abdominal wall lesion evident. IMPRESSION: 1. Appendix upper normal in size without periappendiceal region inflammation. Appendiceal wall does not appear appreciably thickened. Correlation with clinical assessment of the right lower quadrant and the white blood cell count advised given upper normal in size appendix. If clinical symptoms persist, a repeat study with particular attention to the appendix may well be warranted. 2. No evident bowel obstruction. No abscess in the abdomen or pelvis. 3.  Intrauterine device positioned in the endometrium. 4. Apparent physiologic follicle right ovary. Rather minimal fluid cul-de-sac is likely within the physiologic range. 5.  No evident renal or ureteral calculus.  No hydronephrosis. 6.  Minimal ventral hernia containing only fat. Electronically Signed   By: Bretta BangWilliam  Woodruff III M.D.   On: 11/11/2018 12:34    Procedures Procedures (including critical care time)  Medications Ordered in ED Medications  sodium chloride 0.9 % bolus 1,000 mL (0 mLs Intravenous Stopped 11/11/18 1144)  iohexol (OMNIPAQUE) 300 MG/ML solution 100 mL (100 mLs Intravenous Contrast Given 11/11/18 1214)     Initial Impression / Assessment and Plan / ED Course  I have reviewed the triage vital signs and the nursing notes.  Pertinent labs & imaging results that were available during my care of the patient were reviewed by me and considered in my medical decision making (see chart for details).  Clinical  Course as of Nov 11 1356  Fri Nov 11, 2018  [id number] 38 year old female presents with complaint of pelvic cramping and generalized abdominal pain for the past week with report of blood in her stool.  Exam patient generalized abdominal tenderness, is otherwise well-appearing.  hCG is negative, lipase within normal limits, CBC within normal limits, CMP unremarkable, CT with contrast abdomen pelvis shows upper limits of normal appendix without suggestion  of acute appendicitis.  Serial abdominal exam, pain has improved, patient states her pain at this point is in the middle of her low back.  [facility], advised to follow-up with GI.  Advised patient to follow-up with GI, establish care with PCP and recheck.  Discussed upper limits normal appendix and patient to return to ER for any worsening or concerning symptoms.   [LM]    Clinical Course User Index [LM] Jeannie Fend, PA-C   Final Clinical Impressions(s) / ED Diagnoses   Final diagnoses:  Generalized abdominal pain    ED Discharge Orders    None       Jeannie Fend, PA-C 11/11/18 1358    Little, Ambrose Finland, MD 11/11/18 [phone]

## 2018-12-11 ENCOUNTER — Other Ambulatory Visit: Payer: Self-pay

## 2018-12-11 ENCOUNTER — Emergency Department (HOSPITAL_COMMUNITY)
Admission: EM | Admit: 2018-12-11 | Discharge: 2018-12-11 | Disposition: A | Discharge status: Home / Self Care | Payer: Self-pay | Attending: Emergency Medicine | Admitting: Emergency Medicine

## 2018-12-11 DIAGNOSIS — J020 Streptococcal pharyngitis: Secondary | ICD-10-CM | POA: Insufficient documentation

## 2018-12-11 DIAGNOSIS — N390 Urinary tract infection, site not specified: Secondary | ICD-10-CM | POA: Insufficient documentation

## 2018-12-11 LAB — URINALYSIS, ROUTINE W REFLEX MICROSCOPIC
BILIRUBIN URINE: NEGATIVE
Glucose, UA: NEGATIVE mg/dL
HGB URINE DIPSTICK: NEGATIVE
KETONES UR: NEGATIVE mg/dL
NITRITE: NEGATIVE
Protein, ur: NEGATIVE mg/dL
SPECIFIC GRAVITY, URINE: 1.026 (ref 1.005–1.030)
pH: 8 (ref 5.0–8.0)

## 2018-12-11 LAB — GROUP A STREP BY PCR: Group A Strep by PCR: DETECTED — AB

## 2018-12-11 LAB — PREGNANCY, URINE: PREG TEST UR: NEGATIVE

## 2018-12-11 MED ORDER — IBUPROFEN 600 MG PO TABS: 600.0000 mg | ORAL_TABLET | Freq: Four times a day (QID) | ORAL | 0 refills | Status: AC | PRN

## 2018-12-11 MED ORDER — ONDANSETRON 4 MG PO TBDP
4.0000 mg | ORAL_TABLET | Freq: Once | ORAL | Status: AC
  Administered 2018-12-11: 4 mg via ORAL
  Filled 2018-12-11: qty 1

## 2018-12-11 MED ORDER — PENICILLIN G BENZATHINE 1200000 UNIT/2ML IM SUSP
1.2000 10*6.[IU] | Freq: Once | INTRAMUSCULAR | Status: AC
  Administered 2018-12-11: 1.2 10*6.[IU] via INTRAMUSCULAR
  Filled 2018-12-11: qty 2

## 2018-12-11 MED ORDER — NITROFURANTOIN MONOHYD MACRO 100 MG PO CAPS: 100.0000 mg | ORAL_CAPSULE | Freq: Two times a day (BID) | ORAL | 0 refills | Status: DC

## 2018-12-11 MED ORDER — ACETAMINOPHEN 325 MG PO TABS
325.0000 mg | ORAL_TABLET | Freq: Once | ORAL | Status: AC
  Administered 2018-12-11: 325 mg via ORAL
  Filled 2018-12-11: qty 1

## 2018-12-11 NOTE — ED Triage Notes (Signed)
Pt reports fever since Friday, highest temp at home 102. Pt reports she took 400 mg of ibuprofen yesterday. Pt reports chills, dizziness, body aches, nausea, decreased appetite, headache and sore throat. Denies sick contact, cough, vomiting, did not receive flu shot this year.

## 2018-12-11 NOTE — Discharge Instructions (Signed)
If your pain worsens, return to the ER -  Macrobid twice daily for 5 days Motrin for pain, alternating with tylenol  Your test showed that you have a UTI and strep throat. You were given a shot of penicillin for the strep throat which completely treats it.

## 2018-12-11 NOTE — ED Provider Notes (Signed)
[facility] Provider Note   CSN: [id number] Arrival date & time: 12/11/18  1544     History   Chief Complaint Chief Complaint  Patient presents with  . Fever    HPI Sharlett Genevie Cheshirebou Ennassre is a 38 y.o. female.  HPI  The patient is a 38 year old female, she has a history of no significant past medical problems, she denies taking any daily medicines and other than ibuprofen which she has been taking for the last couple of days for fevers she does not take any over-the-counter medicines either.  She reports that she started having sore throat headache dizziness and a fever which was as high as 102 approximately 48 hours ago.  This is been rather persistent and though it does improve transiently with ibuprofen it seems to come back.  She also notes some increased urinary frequency but denies any diarrhea or vomiting.  She is nauseated.  She has myalgias.  She does not know if she is had any sick contacts.  The people at home have not been sick  Past Medical History:  Diagnosis Date  . Blood transfusion without reported diagnosis    no infusion reaction  . Tuberculosis    inactive    Patient Active Problem List   Diagnosis Date Noted  . S/P cesarean section 04/05/2015  . Unspecified vitamin D deficiency 10/27/2013    Past Surgical History:  Procedure Laterality Date  . CESAREAN SECTION N/A 04/04/2015   Procedure: CESAREAN SECTION;  Surgeon: Brock Badharles A Harper, MD;  Location: [facility];  Service: Obstetrics;  Laterality: N/A;  . DILATION AND CURETTAGE OF UTERUS    . NOSE SURGERY       OB History    Gravida  4   Para  4   Term  4   Preterm      AB      Living  4     SAB      TAB      Ectopic      Multiple  0   Live Births  4            Home Medications    Prior to Admission medications   Medication Sig Start Date End Date Taking? Authorizing Provider  diazepam (VALIUM) 5 MG tablet Take 1 tablet (5 mg total) every 6  (six) hours as needed by mouth for muscle spasms. Patient not taking: Reported on 10/26/2017 09/06/17   Gilda CreasePollina, Christopher J, MD  fluticasone [facility](FLONASE) 50 MCG/ACT nasal spray Place 1 spray into both nostrils daily. Patient not taking: Reported on 11/11/2018 12/15/17   Graciella FreerLayden, Lindsey A, PA-C  ibuprofen (ADVIL,MOTRIN) 600 MG tablet Take 1 tablet (600 mg total) by mouth every 6 (six) hours as needed. 12/11/18   Eber HongMiller, Joevon Holliman, MD  meloxicam (MOBIC) 15 MG tablet Take 1 tablet (15 mg total) by mouth daily. Patient not taking: Reported on 10/26/2017 12/02/16   Bethel BornGekas, Kelly Marie, PA-C  methocarbamol (ROBAXIN) 500 MG tablet Take 1 tablet (500 mg total) by mouth at bedtime and may repeat dose one time if needed. Patient not taking: Reported on 10/26/2017 12/02/16   Bethel BornGekas, Kelly Marie, PA-C  nitrofurantoin, macrocrystal-monohydrate, (MACROBID) 100 MG capsule Take 1 capsule (100 mg total) by mouth 2 (two) times daily. 12/11/18   Eber HongMiller, Nikeya Maxim, MD  omeprazole (PRILOSEC) 20 MG capsule Take 1 capsule (20 mg total) by mouth 2 (two) times daily before a meal for 14 days. Patient not taking: Reported on 11/11/2018  10/26/17 11/09/17  Michela Pitcher A, PA-C  traMADol (ULTRAM) 50 MG tablet Take 1 tablet (50 mg total) by mouth every 12 (twelve) hours as needed. Patient not taking: Reported on 10/26/2017 02/14/16   Tomasita Crumble, MD    Family History Family History  Problem Relation Age of Onset  . Diabetes Mother   . Hypertension Mother     Social History Social History   Tobacco Use  . Smoking status: Never Smoker  . Smokeless tobacco: Never Used  Substance Use Topics  . Alcohol use: No    Alcohol/week: 0.0 standard drinks  . Drug use: No     Allergies   Patient has no known allergies.   Review of Systems Review of Systems  Constitutional: Positive for chills and fever.  HENT: Positive for sore throat. Negative for congestion.   Eyes: Negative for discharge and redness.  Respiratory: Negative for cough  and shortness of breath.   Cardiovascular: Negative for chest pain and leg swelling.  Gastrointestinal: Positive for nausea. Negative for diarrhea and vomiting.  Endocrine: Positive for polyuria.  Genitourinary: Positive for frequency.  Musculoskeletal: Positive for myalgias.  Skin: Negative for rash.  Neurological: Positive for dizziness and light-headedness.  Hematological: Positive for adenopathy.     Physical Exam Updated Vital Signs BP 129/78   Pulse 100   Temp 99.9 F (37.7 C) (Oral)   Resp 20   SpO2 98%   Physical Exam Vitals signs and nursing note reviewed.  Constitutional:      General: She is not in acute distress.    Appearance: She is well-developed.  HENT:     Head: Normocephalic and atraumatic.     Mouth/Throat:     Pharynx: No oropharyngeal exudate.     Comments: There is the presence of pharyngitis but no exudate.  There is no asymmetry of the posterior tonsils Eyes:     General: No scleral icterus.       Right eye: No discharge.        Left eye: No discharge.     Conjunctiva/sclera: Conjunctivae normal.     Pupils: Pupils are equal, round, and reactive to light.  Neck:     Musculoskeletal: Normal range of motion and neck supple.     Thyroid: No thyromegaly.     Vascular: No JVD.     Comments: Tender left-sided anterior lymphadenopathy.  No asymmetry Cardiovascular:     Rate and Rhythm: Normal rate and regular rhythm.     Heart sounds: Normal heart sounds. No murmur. No friction rub. No gallop.   Pulmonary:     Effort: Pulmonary effort is normal. No respiratory distress.     Breath sounds: Normal breath sounds. No wheezing or rales.  Abdominal:     General: Bowel sounds are normal. There is no distension.     Palpations: Abdomen is soft. There is no mass.     Tenderness: There is no abdominal tenderness.  Musculoskeletal: Normal range of motion.        General: No tenderness.  Lymphadenopathy:     Cervical: Cervical adenopathy present.  Skin:     General: Skin is warm and dry.     Findings: No erythema or rash.  Neurological:     Mental Status: She is alert.     Coordination: Coordination normal.  Psychiatric:        Behavior: Behavior normal.      ED Treatments / Results  Labs (all labs ordered are listed, but only abnormal  results are displayed) Labs Reviewed  GROUP A STREP BY PCR - Abnormal; Notable for the following components:      Result Value   Group A Strep by PCR DETECTED (*)    All other components within normal limits  URINALYSIS, ROUTINE W REFLEX MICROSCOPIC - Abnormal; Notable for the following components:   APPearance CLOUDY (*)    Leukocytes, UA LARGE (*)    Bacteria, UA FEW (*)    Non Squamous Epithelial 0-5 (*)    All other components within normal limits  PREGNANCY, URINE    EKG None  Radiology No results found.  Procedures Procedures (including critical care time)  Medications Ordered in ED Medications  penicillin g benzathine (BICILLIN LA) 1200000 UNIT/2ML injection 1.2 Million Units (has no administration in time range)  acetaminophen (TYLENOL) tablet 325 mg (325 mg Oral Given 12/11/18 1625)  ondansetron (ZOFRAN-ODT) disintegrating tablet 4 mg (4 mg Oral Given 12/11/18 1627)     Initial Impression / Assessment and Plan / ED Course  I have reviewed the triage vital signs and the nursing notes.  Pertinent labs & imaging results that were available during my care of the patient were reviewed by me and considered in my medical decision making (see chart for details).  Clinical Course as of Dec 11 1728  Wynelle LinkSun Dec 11, 2018  1706 Strep postivie Antibiotics R/o UTI / preg Otherwise pt stable for d/c   [BM]  1724 Pt has some signs of UTI as well, will give PCN IM here and macrobid for home, pt agreeable.   [BM]    Clinical Course User Index [BM] Eber HongMiller, Trevionne Advani, MD   Other than having a borderline tachycardia and some erythema of her posterior pharynx I do not see any other signs of acute  infection.  The absence of a cough in the presence of fever with a sore throat suggest possible strep, she will be tested however this is more than likely going to be an influenza-like illness.  If positive antibiotics if negative Tamiflu, patient agreeable.  Tylenol and Zofran given  Final Clinical Impressions(s) / ED Diagnoses   Final diagnoses:  Strep throat  Urinary tract infection without hematuria, site unspecified    ED Discharge Orders         Ordered    nitrofurantoin, macrocrystal-monohydrate, (MACROBID) 100 MG capsule  2 times daily     12/11/18 1726    ibuprofen (ADVIL,MOTRIN) 600 MG tablet  Every 6 hours PRN     12/11/18 1727           Eber HongMiller, Yumna Ebers, MD 12/11/18 1730

## 2018-12-19 ENCOUNTER — Emergency Department (HOSPITAL_COMMUNITY)
Admission: EM | Admit: 2018-12-19 | Discharge: 2018-12-20 | Disposition: A | Discharge status: Home / Self Care | Payer: Self-pay | Attending: Emergency Medicine | Admitting: Emergency Medicine

## 2018-12-19 ENCOUNTER — Other Ambulatory Visit: Payer: Self-pay

## 2018-12-19 DIAGNOSIS — Z5321 Procedure and treatment not carried out due to patient leaving prior to being seen by health care provider: Secondary | ICD-10-CM | POA: Insufficient documentation

## 2018-12-19 DIAGNOSIS — M25552 Pain in left hip: Secondary | ICD-10-CM | POA: Insufficient documentation

## 2018-12-19 NOTE — ED Triage Notes (Signed)
Pt states that she was seen last Sunday 12/11/18 for strep throat and was given an injection on her left hip. States that now the site is still sore and "feels like there is a rock in there." She also states that her joints in her left leg are now swollen making it hard for her to walk.

## 2018-12-20 NOTE — ED Notes (Signed)
Pt notified registration staff that they were leaving due to wait time. 

## 2018-12-21 ENCOUNTER — Emergency Department (HOSPITAL_COMMUNITY)
Admission: EM | Admit: 2018-12-21 | Discharge: 2018-12-21 | Disposition: A | Discharge status: Home / Self Care | Payer: Self-pay | Attending: Emergency Medicine | Admitting: Emergency Medicine

## 2018-12-21 ENCOUNTER — Encounter (HOSPITAL_COMMUNITY): Payer: Self-pay | Admitting: *Deleted

## 2018-12-21 DIAGNOSIS — M25562 Pain in left knee: Secondary | ICD-10-CM | POA: Insufficient documentation

## 2018-12-21 DIAGNOSIS — T8089XA Other complications following infusion, transfusion and therapeutic injection, initial encounter: Secondary | ICD-10-CM | POA: Insufficient documentation

## 2018-12-21 DIAGNOSIS — Y69 Unspecified misadventure during surgical and medical care: Secondary | ICD-10-CM | POA: Insufficient documentation

## 2018-12-21 DIAGNOSIS — Z79899 Other long term (current) drug therapy: Secondary | ICD-10-CM | POA: Insufficient documentation

## 2018-12-21 NOTE — ED Provider Notes (Signed)
[facility] EMERGENCY DEPARTMENT Provider Note   CSN: [id number] Arrival date & time: 12/21/18  0806   History   Chief Complaint Chief Complaint  Patient presents with  . Medication Reaction    HPI Chelsea Castaneda is a 38 y.o. female.     HPI   38 year old female presents today with complaints of injection site irritation.  Patient notes that she was seen on 12/11/2018 with strep throat.  She received an injection of penicillin in her left buttock region.  She notes irritation in a nodule at the site since then.  Patient also notes she has been having left anterior knee pain at the same time.  She denies any pain full range of motion, notes that occasionally she feels a sharp pain in the knee.  She denies any fever, redness or swelling.  No other complaints here today.  Past Medical History:  Diagnosis Date  . Blood transfusion without reported diagnosis    no infusion reaction  . Tuberculosis    inactive    Patient Active Problem List   Diagnosis Date Noted  . S/P cesarean section 04/05/2015  . Unspecified vitamin D deficiency 10/27/2013    Past Surgical History:  Procedure Laterality Date  . CESAREAN SECTION N/A 04/04/2015   Procedure: CESAREAN SECTION;  Surgeon: Brock Bad, MD;  Location: [facility];  Service: Obstetrics;  Laterality: N/A;  . DILATION AND CURETTAGE OF UTERUS    . NOSE SURGERY       OB History    Gravida  4   Para  4   Term  4   Preterm      AB      Living  4     SAB      TAB      Ectopic      Multiple  0   Live Births  4            Home Medications    Prior to Admission medications   Medication Sig Start Date End Date Taking? Authorizing Provider  diazepam (VALIUM) 5 MG tablet Take 1 tablet (5 mg total) every 6 (six) hours as needed by mouth for muscle spasms. Patient not taking: Reported on 10/26/2017 09/06/17   Gilda Crease, MD  fluticasone [facility]) 50 MCG/ACT nasal spray Place 1 spray  into both nostrils daily. Patient not taking: Reported on 11/11/2018 12/15/17   Graciella Freer A, PA-C  ibuprofen (ADVIL,MOTRIN) 600 MG tablet Take 1 tablet (600 mg total) by mouth every 6 (six) hours as needed. 12/11/18   Eber Hong, MD  meloxicam (MOBIC) 15 MG tablet Take 1 tablet (15 mg total) by mouth daily. Patient not taking: Reported on 10/26/2017 12/02/16   Bethel Born, PA-C  methocarbamol (ROBAXIN) 500 MG tablet Take 1 tablet (500 mg total) by mouth at bedtime and may repeat dose one time if needed. Patient not taking: Reported on 10/26/2017 12/02/16   Bethel Born, PA-C  nitrofurantoin, macrocrystal-monohydrate, (MACROBID) 100 MG capsule Take 1 capsule (100 mg total) by mouth 2 (two) times daily. 12/11/18   Eber Hong, MD  omeprazole (PRILOSEC) 20 MG capsule Take 1 capsule (20 mg total) by mouth 2 (two) times daily before a meal for 14 days. Patient not taking: Reported on 11/11/2018 10/26/17 11/09/17  Michela Pitcher A, PA-C  traMADol (ULTRAM) 50 MG tablet Take 1 tablet (50 mg total) by mouth every 12 (twelve) hours as needed. Patient not taking: Reported on 10/26/2017 02/14/16  Tomasita Crumble, MD    Family History Family History  Problem Relation Age of Onset  . Diabetes Mother   . Hypertension Mother     Social History Social History   Tobacco Use  . Smoking status: Never Smoker  . Smokeless tobacco: Never Used  Substance Use Topics  . Alcohol use: No    Alcohol/week: 0.0 standard drinks  . Drug use: No     Allergies   Patient has no known allergies.   Review of Systems Review of Systems  All other systems reviewed and are negative.    Physical Exam Updated Vital Signs BP 116/78 (BP Location: Left Arm)   Pulse 64   Temp 97.8 F (36.6 C) (Oral)   Resp 16   LMP 12/16/2018 (Exact Date)   SpO2 97%   Physical Exam Vitals signs and nursing note reviewed.  Constitutional:      Appearance: She is well-developed.  HENT:     Head: Normocephalic and  atraumatic.  Eyes:     General: No scleral icterus.       Right eye: No discharge.        Left eye: No discharge.     Conjunctiva/sclera: Conjunctivae normal.     Pupils: Pupils are equal, round, and reactive to light.  Neck:     Musculoskeletal: Normal range of motion.     Vascular: No JVD.     Trachea: No tracheal deviation.  Pulmonary:     Effort: Pulmonary effort is normal.     Breath sounds: No stridor.  Musculoskeletal:     Comments: Nodule noted on the left buttock, no fluctuance, no overlying redness, no significant tenderness  Knee is atraumatic nontender full active range of motion, no redness warmth no laxity  Neurological:     Mental Status: She is alert and oriented to person, place, and time.     Coordination: Coordination normal.  Psychiatric:        Behavior: Behavior normal.        Thought Content: Thought content normal.        Judgment: Judgment normal.      ED Treatments / Results  Labs (all labs ordered are listed, but only abnormal results are displayed) Labs Reviewed - No data to display  EKG None  Radiology No results found.  Procedures Procedures (including critical care time)  Medications Ordered in ED Medications - No data to display   Initial Impression / Assessment and Plan / ED Course  I have reviewed the triage vital signs and the nursing notes.  Pertinent labs & imaging results that were available during my care of the patient were reviewed by me and considered in my medical decision making (see chart for details).        38 year old female presents today with complaints of injection site irritation.  No signs of infection.  Patient also complaining of pain in her knee.  Question reactive arthritis, no signs of infectious etiology presently.  Anti-inflammatories return if symptoms worsen or do not improve.  She verbalized understanding and agreement to today's plan had no further questions or concerns.  Final Clinical  Impressions(s) / ED Diagnoses   Final diagnoses:  Acute pain of left knee  Irritation of injection site, initial encounter    ED Discharge Orders    None       Rosalio Loud 12/21/18 1116    Lorre Nick, MD 12/21/18 1523

## 2018-12-21 NOTE — Discharge Instructions (Signed)
Please read attached information. If you experience any new or worsening signs or symptoms please return to the emergency room for evaluation. Please follow-up with your primary care provider or specialist as discussed.  °

## 2018-12-21 NOTE — ED Triage Notes (Signed)
Pt in seen on the 17th and received an antibiotic shot for strep throat and since that time has swelling to the injection site and pain to her knee below the injection site with swelling

## 2019-12-14 ENCOUNTER — Ambulatory Visit: Payer: Medicaid Other | Attending: Internal Medicine

## 2019-12-14 DIAGNOSIS — Z20822 Contact with and (suspected) exposure to covid-19: Secondary | ICD-10-CM | POA: Insufficient documentation

## 2019-12-15 LAB — NOVEL CORONAVIRUS, NAA: SARS-CoV-2, NAA: NOT DETECTED

## 2019-12-20 ENCOUNTER — Ambulatory Visit: Payer: Self-pay | Admitting: *Deleted

## 2019-12-20 NOTE — Telephone Encounter (Signed)
Pt called with having urinary frequency and pain on her bladder and right flank pain. Her urine is clear. She is also having body aches, states "whole body hurts". She has fatigue. Had a fever and chills on Sunday. She tested negative for covid on Thursday and her kids tested positive.  She stated that they had not been exposed to any one. She is taking Ibuprofen for the pain. She is advised to see her doctor for the urinary concerns. She can not go to the office with her symptoms, so she is going to the ED.  Reason for Disposition . Side (flank) or lower back pain present  Answer Assessment - Initial Assessment Questions 1. SYMPTOM: "What's the main symptom you're concerned about?" (e.g., frequency, incontinence)     frequency 2. ONSET: "When did the  frequency  start?"     today 3. PAIN: "Is there any pain?" If so, ask: "How bad is it?" (Scale: 1-10; mild, moderate, severe)     Yes moderate 4. CAUSE: "What do you think is causing the symptoms?"     No sure 5. OTHER SYMPTOMS: "Do you have any other symptoms?" (e.g., fever, flank pain, blood in urine, pain with urination)     Flank pain and had fever 6. PREGNANCY: "Is there any chance you are pregnant?" "When was your last menstrual period?"  Protocols used: URINARY [facility]

## 2020-01-11 ENCOUNTER — Other Ambulatory Visit: Payer: Self-pay | Admitting: Family Medicine

## 2020-01-11 DIAGNOSIS — Z1231 Encounter for screening mammogram for malignant neoplasm of breast: Secondary | ICD-10-CM

## 2020-09-12 ENCOUNTER — Other Ambulatory Visit: Payer: Self-pay

## 2020-09-12 ENCOUNTER — Ambulatory Visit: Payer: 59 | Admitting: Podiatry

## 2020-09-12 ENCOUNTER — Ambulatory Visit (INDEPENDENT_AMBULATORY_CARE_PROVIDER_SITE_OTHER): Payer: 59

## 2020-09-12 ENCOUNTER — Encounter: Payer: Self-pay | Admitting: Podiatry

## 2020-09-12 DIAGNOSIS — M722 Plantar fascial fibromatosis: Secondary | ICD-10-CM

## 2020-09-12 MED ORDER — MELOXICAM 15 MG PO TABS: 15.0000 mg | ORAL_TABLET | Freq: Every day | ORAL | 3 refills | Status: AC

## 2020-09-12 MED ORDER — METHYLPREDNISOLONE 4 MG PO TBPK: ORAL_TABLET | ORAL | 0 refills | Status: DC

## 2020-09-12 NOTE — Patient Instructions (Signed)

## 2020-09-14 NOTE — Progress Notes (Signed)
Subjective:  Patient ID: Chelsea Castaneda, female    DOB: 03-12-1981,  MRN: [id number] HPI Chief Complaint  Patient presents with  . Foot Pain    Lateral foot/ankle/plantar heel right - aching, swelling x 2 months, no injury, AM pain, went to Urgent Care-xrayed-said heel spur, Rx'd naproxen-no help, can't bear full weight, UC also said she had a small nodule plantar forefoot right   . New Patient (Initial Visit)    39 y.o. female presents with the above complaint.   ROS: Denies fever chills nausea vomiting muscle aches pains calf pain back pain chest pain shortness of breath.  Past Medical History:  Diagnosis Date  . Blood transfusion without reported diagnosis    no infusion reaction  . Tuberculosis    inactive   Past Surgical History:  Procedure Laterality Date  . CESAREAN SECTION N/A 04/04/2015   Procedure: CESAREAN SECTION;  Surgeon: Brock Bad, MD;  Location: [facility];  Service: Obstetrics;  Laterality: N/A;  . DILATION AND CURETTAGE OF UTERUS    . NOSE SURGERY      Current Outpatient Medications:  .  ibuprofen (ADVIL,MOTRIN) 600 MG tablet, Take 1 tablet (600 mg total) by mouth every 6 (six) hours as needed., Disp: 30 tablet, Rfl: 0 .  meloxicam (MOBIC) 15 MG tablet, Take 1 tablet (15 mg total) by mouth daily., Disp: 30 tablet, Rfl: 3 .  methylPREDNISolone (MEDROL) 4 MG TBPK tablet, Tapering 6 day dose pack, Disp: 21 tablet, Rfl: 0  No Known Allergies Review of Systems Objective:  There were no vitals filed for this visit.  General: Well developed, nourished, in no acute distress, alert and oriented x3   Dermatological: Skin is warm, dry and supple bilateral. Nails x 10 are well maintained; remaining integument appears unremarkable at this time. There are no open sores, no preulcerative lesions, no rash or signs of infection present.  Vascular: Dorsalis Pedis artery and Posterior Tibial artery pedal pulses are 2/4 bilateral with immedate capillary fill time.  Pedal hair growth present. No varicosities and no lower extremity edema present bilateral.   Neruologic: Grossly intact via light touch bilateral. Vibratory intact via tuning fork bilateral. Protective threshold with Semmes Wienstein monofilament intact to all pedal sites bilateral. Patellar and Achilles deep tendon reflexes 2+ bilateral. No Babinski or clonus noted bilateral.   Musculoskeletal: No gross boney pedal deformities bilateral. No pain, crepitus, or limitation noted with foot and ankle range of motion bilateral. Muscular strength 5/5 in all groups tested bilateral.  Pain on palpation medial calcaneal tubercle of the right heel.  She also has pain to the dorsal and dorsal lateral aspect of the left foot and beneath the lateral malleolus. Gait: Unassisted, Nonantalgic.    Radiographs:  Radiographs taken today demonstrate plantar distally oriented calcaneal heel spur soft tissue increase in density of plantar fascial kidney insertion site there appears to be some increase in density at the Achilles level.  In a very small proximally oriented retrocalcaneal heel spur.  Otherwise relatively flat foot no other soft tissue or osseous issues visible.  Assessment & Plan:   Assessment: Plantar fasciitis right.  Plan: Discussed etiology pathology conservative surgical therapies start her on methylprednisolone to be followed by meloxicam.  Injected the right heel 20 mg Kenalog 5 mg Marcaine point maximal tenderness.  Placed her in plantar fascial brace I will follow-up with her in 1 month.  We did discuss in great detail stretching exercises ice therapy and shoe gear modifications.  Garrel Ridgel, DPM

## 2020-10-15 ENCOUNTER — Other Ambulatory Visit: Payer: Self-pay

## 2020-10-15 ENCOUNTER — Encounter: Payer: Self-pay | Admitting: Podiatry

## 2020-10-15 ENCOUNTER — Ambulatory Visit (INDEPENDENT_AMBULATORY_CARE_PROVIDER_SITE_OTHER): Payer: 59 | Admitting: Podiatry

## 2020-10-15 DIAGNOSIS — M722 Plantar fascial fibromatosis: Secondary | ICD-10-CM

## 2020-10-15 NOTE — Progress Notes (Signed)
She presents today for follow-up of her plantar fasciitis of her right foot.  States that he was doing really well for about 2 weeks then reverted back to the way it was prior to our treatment plan beginning.  She states that she has good shoes at home that she does not wear her low plantar fascial brace because it does not help her does not feel comfortable to her and she would like to try physical therapy before anything else.  Objective: Vital signs are stable she alert oriented x3 pulses are palpable bilateral.  Right foot is a little bit warm compared to the left.  A little firmer on palpation medial calcaneal tubercle to the right of the left.  She has no pain on medial lateral compression of either calcaneus.  Assessment: Plantar fasciitis right.  Plan: Discussed etiology pathology conservative surgical therapies at this point I am going [facility] and I will follow-up with her once she has completed that if necessary.  We did discuss the possible need of an MRI today if all else fails.

## 2020-11-08 ENCOUNTER — Other Ambulatory Visit: Payer: Medicaid Other

## 2020-11-08 DIAGNOSIS — Z20822 Contact with and (suspected) exposure to covid-19: Secondary | ICD-10-CM

## 2020-11-11 LAB — NOVEL CORONAVIRUS, NAA: SARS-CoV-2, NAA: NOT DETECTED

## 2021-03-12 ENCOUNTER — Ambulatory Visit: Payer: 59 | Admitting: Podiatry

## 2021-04-02 ENCOUNTER — Other Ambulatory Visit: Payer: Self-pay | Admitting: Physician Assistant

## 2021-04-02 DIAGNOSIS — N644 Mastodynia: Secondary | ICD-10-CM

## 2021-04-21 NOTE — Progress Notes (Deleted)
    SUBJECTIVE:   CHIEF COMPLAINT / HPI:      Past Medical History: Past Medical History:  Diagnosis Date   Blood transfusion without reported diagnosis    no infusion reaction   Tuberculosis    inactive    Past Surgical History: Past Surgical History:  Procedure Laterality Date   CESAREAN SECTION N/A 04/04/2015   Procedure: CESAREAN SECTION;  Surgeon: Brock Bad, MD;  Location: [facility];  Service: Obstetrics;  Laterality: N/A;   DILATION AND CURETTAGE OF UTERUS     NOSE SURGERY      Obstetrical History: OB History     Gravida  4   Para  4   Term  4   Preterm      AB      Living  4      SAB      IAB      Ectopic      Multiple  0   Live Births  4           Social History: Social History   Socioeconomic History   Marital status: Married    Spouse name: Not on file   Number of children: Not on file   Years of education: Not on file   Highest education level: Not on file  Occupational History   Not on file  Tobacco Use   Smoking status: Never   Smokeless tobacco: Never  Vaping Use   Vaping Use: Never used  Substance and Sexual Activity   Alcohol use: No    Alcohol/week: 0.0 standard drinks   Drug use: No   Sexual activity: Yes    Birth control/protection: I.U.D.    Comment: last sex Oce 6 2018  Other Topics Concern   Not on file  Social History Narrative   Not on file   Social Determinants of Health   Financial Resource Strain: Not on file  Food Insecurity: Not on file  Transportation Needs: Not on file  Physical Activity: Not on file  Stress: Not on file  Social Connections: Not on file    Family History: Family History  Problem Relation Age of Onset   Diabetes Mother    Hypertension Mother     Allergies: No Known Allergies  (Not in a hospital admission)    Review of Systems   All systems reviewed and negative except as stated in HPI   OBJECTIVE:   There were no vitals taken for this visit.    General: Alert, no acute distress Cardio: Normal S1 and S2, RRR, no r/m/g Pulm: CTAB, normal work of breathing Abdomen: Bowel sounds normal. Abdomen soft and non-tender.  Extremities: No peripheral edema.  Neuro: Cranial nerves grossly intact   ASSESSMENT/PLAN:   No problem-specific Assessment & Plan notes found for this encounter.     Dana Allan, MD [facility]

## 2021-04-23 ENCOUNTER — Ambulatory Visit: Payer: Medicaid Other | Admitting: Family Medicine

## 2021-05-30 ENCOUNTER — Other Ambulatory Visit: Payer: Medicaid Other

## 2021-06-19 ENCOUNTER — Other Ambulatory Visit: Payer: Medicaid Other

## 2021-06-27 ENCOUNTER — Ambulatory Visit
Admission: RE | Admit: 2021-06-27 | Discharge: 2021-06-27 | Disposition: A | Discharge status: Home / Self Care | Payer: 59 | Source: Ambulatory Visit | Attending: Physician Assistant | Admitting: Physician Assistant

## 2021-06-27 ENCOUNTER — Other Ambulatory Visit: Payer: Self-pay

## 2021-06-27 DIAGNOSIS — N644 Mastodynia: Secondary | ICD-10-CM

## 2022-01-13 ENCOUNTER — Ambulatory Visit (HOSPITAL_COMMUNITY)
Admission: EM | Admit: 2022-01-13 | Discharge: 2022-01-13 | Disposition: A | Discharge status: Home / Self Care | Payer: 59 | Attending: Family Medicine | Admitting: Family Medicine

## 2022-01-13 ENCOUNTER — Encounter (HOSPITAL_COMMUNITY): Payer: Self-pay | Admitting: Emergency Medicine

## 2022-01-13 ENCOUNTER — Other Ambulatory Visit: Payer: Self-pay

## 2022-01-13 DIAGNOSIS — H6983 Other specified disorders of Eustachian tube, bilateral: Secondary | ICD-10-CM

## 2022-01-13 DIAGNOSIS — J019 Acute sinusitis, unspecified: Secondary | ICD-10-CM | POA: Diagnosis not present

## 2022-01-13 DIAGNOSIS — H6122 Impacted cerumen, left ear: Secondary | ICD-10-CM | POA: Diagnosis not present

## 2022-01-13 MED ORDER — AMOXICILLIN-POT CLAVULANATE 875-125 MG PO TABS: 1.0000 | ORAL_TABLET | Freq: Two times a day (BID) | ORAL | 0 refills | Status: AC

## 2022-01-13 NOTE — ED Provider Notes (Addendum)
?[facility] ? ? ? ?CSN: [id number] ?Arrival date & time: 01/13/22  9702 ? ? ?  ? ?History   ?Chief Complaint ?Chief Complaint  ?Patient presents with  ? Cerumen Impaction  ? Sinus Congestion  ? ? ?HPI ?Billye Taela Charbonneau is a 41 y.o. female.  ? ?Patient is here for sinus congestion, drainage.  Ears feels clogged, blocked. Going on over 2 weeks.  No fevers.   ?Taking otc medication without much help.  ? ? ?Past Medical History:  ?Diagnosis Date  ? Blood transfusion without reported diagnosis   ? no infusion reaction  ? Tuberculosis   ? inactive  ? ? ?Patient Active Problem List  ? Diagnosis Date Noted  ? S/P cesarean section 04/05/2015  ? Unspecified vitamin D deficiency 10/27/2013  ? ? ?Past Surgical History:  ?Procedure Laterality Date  ? CESAREAN SECTION N/A 04/04/2015  ? Procedure: CESAREAN SECTION;  Surgeon: Brock Bad, MD;  Location: [facility];  Service: Obstetrics;  Laterality: N/A;  ? DILATION AND CURETTAGE OF UTERUS    ? NOSE SURGERY    ? ? ?OB History   ? ? Gravida  ?4  ? Para  ?4  ? Term  ?4  ? Preterm  ?   ? AB  ?   ? Living  ?4  ?  ? ? SAB  ?   ? IAB  ?   ? Ectopic  ?   ? Multiple  ?0  ? Live Births  ?4  ?   ?  ?  ? ? ? ?Home Medications   ? ?Prior to Admission medications   ?Medication Sig Start Date End Date Taking? Authorizing Provider  ?ibuprofen (ADVIL,MOTRIN) 600 MG tablet Take 1 tablet (600 mg total) by mouth every 6 (six) hours as needed. 12/11/18   Eber Hong, MD  ?meloxicam (MOBIC) 15 MG tablet Take 1 tablet (15 mg total) by mouth daily. 09/12/20   Hyatt, Max T, DPM  ?methylPREDNISolone (MEDROL) 4 MG TBPK tablet Tapering 6 day dose pack 09/12/20   Hyatt, Max T, DPM  ? ? ?Family History ?Family History  ?Problem Relation Age of Onset  ? Diabetes Mother   ? Hypertension Mother   ? ? ?Social History ?Social History  ? ?Tobacco Use  ? Smoking status: Never  ? Smokeless tobacco: Never  ?Vaping Use  ? Vaping Use: Never used  ?Substance Use Topics  ? Alcohol use: No  ?  Alcohol/week: 0.0  standard drinks  ? Drug use: No  ? ? ? ?Allergies   ?Patient has no known allergies. ? ? ?Review of Systems ?Review of Systems  ?Constitutional:  Negative for chills and fever.  ?HENT:  Positive for congestion, hearing loss and rhinorrhea. Negative for ear discharge and ear pain.   ?Respiratory: Negative.    ?Cardiovascular: Negative.   ?Gastrointestinal: Negative.   ? ? ?Physical Exam ?Triage Vital Signs ?ED Triage Vitals  ?Enc Vitals Group  ?   BP 01/13/22 0836 129/78  ?   Pulse Rate 01/13/22 0836 76  ?   Resp 01/13/22 0836 17  ?   Temp 01/13/22 0836 98.1 ?F (36.7 ?C)  ?   Temp Source 01/13/22 0836 Oral  ?   SpO2 01/13/22 0836 98 %  ?   Weight 01/13/22 0837 175 lb 0.7 oz (79.4 kg)  ?   Height 01/13/22 0837 5\' 7"  (1.702 m)  ?   Head Circumference --   ?   Peak Flow --   ?  Pain Score 01/13/22 0837 0  ?   Pain Loc --   ?   Pain Edu? --   ?   Excl. in GC? --   ? ?No data found. ? ?Updated Vital Signs ?BP 129/78 (BP Location: Right Arm)   Pulse 76   Temp 98.1 ?F (36.7 ?C) (Oral)   Resp 17   Ht 5\' 7"  (1.702 m)   Wt 79.4 kg   SpO2 98%   BMI 27.42 kg/m?  ? ?Visual Acuity ?Right Eye Distance:   ?Left Eye Distance:   ?Bilateral Distance:   ? ?Right Eye Near:   ?Left Eye Near:    ?Bilateral Near:    ? ?Physical Exam ?Constitutional:   ?   Appearance: Normal appearance.  ?HENT:  ?   Head: Normocephalic and atraumatic.  ?   Right Ear: A middle ear effusion is present. Tympanic membrane is erythematous.  ?   Left Ear: There is impacted cerumen.  ?   Nose: Congestion and rhinorrhea present.  ?   Mouth/Throat:  ?   Mouth: Mucous membranes are moist.  ?Cardiovascular:  ?   Rate and Rhythm: Normal rate and regular rhythm.  ?Pulmonary:  ?   Effort: Pulmonary effort is normal.  ?   Breath sounds: Normal breath sounds.  ?Musculoskeletal:  ?   Cervical back: Neck supple.  ?Neurological:  ?   Mental Status: She is alert.  ? ? ? ?UC Treatments / Results  ?Labs ?(all labs ordered are listed, but only abnormal results are  displayed) ?Labs Reviewed - No data to display ? ?EKG ? ? ?Radiology ?No results found. ? ?Procedures ?Procedures (including critical care time) ? ?The left ear was irrigated with warm water;  unable to remove the cerumen today.  ? ?Medications Ordered in UC ?Medications - No data to display ? ?Initial Impression / Assessment and Plan / UC Course  ?I have reviewed the triage vital signs and the nursing notes. ? ?Pertinent labs & imaging results that were available during my care of the patient were reviewed by me and considered in my medical decision making (see chart for details). ? ?  ?Final Clinical Impressions(s) / UC Diagnoses  ? ?Final diagnoses:  ?Acute non-recurrent sinusitis, unspecified location  ?Impacted cerumen of left ear  ?Eustachian tube dysfunction, bilateral  ? ? ? ?Discharge Instructions   ? ?  ?You were seen today for sinus congestion, and ear fullness.   ?The left ear does have wax that is blocking the ear drum.  We were unable to remove this today as this wax is very hard.  I recommend using over the counter ear wax softener for several weeks, and then you may return for possible removal.  ?Part of the issue may also be a sinus infection that is causing fluid behind the ears.  I have treated you with an antibiotic to your pharmacy that you will take twice/day x 7 days.  You should take over the counter zyrtec daily, as well as flonase nasal spray 2 sprays/notril once daily to help with sinus congestion and fluid behind the ears.  Please follow up if not improving as expected.  ? ? ? ? ?ED Prescriptions   ? ? Medication Sig Dispense Auth. Provider  ? amoxicillin-clavulanate (AUGMENTIN) 875-125 MG tablet Take 1 tablet by mouth every 12 (twelve) hours. 14 tablet , MD  ? ?  ? ?PDMP not reviewed this encounter. ?  ?Jannifer Franklin, MD ?01/13/22 0920 ? ?  ?Herve Haug,  Denny PeonErin, MD ?01/16/22 1107 ? ?

## 2022-01-13 NOTE — ED Triage Notes (Signed)
Pt reports ears being clogged and sinus congestion x 2 weeks. States OTC ear drops did not help.  ?

## 2022-01-13 NOTE — Discharge Instructions (Signed)
You were seen today for sinus congestion, and ear fullness.   ?The left ear does have wax that is blocking the ear drum.  We were unable to remove this today as this wax is very hard.  I recommend using over the counter ear wax softener for several weeks, and then you may return for possible removal.  ?Part of the issue may also be a sinus infection that is causing fluid behind the ears.  I have treated you with an antibiotic to your pharmacy that you will take twice/day x 7 days.  You should take over the counter zyrtec daily, as well as flonase nasal spray 2 sprays/notril once daily to help with sinus congestion and fluid behind the ears.  Please follow up if not improving as expected.  ? ?

## 2022-11-08 ENCOUNTER — Ambulatory Visit: Admit: 2022-11-08 | Discharge: 2022-11-09 | Payer: BLUE CROSS/BLUE SHIELD | Attending: Emergency Medicine

## 2022-11-08 DIAGNOSIS — N3090 Cystitis, unspecified without hematuria: Secondary | ICD-10-CM

## 2022-11-08 LAB — AMB POC URINALYSIS DIP STICK AUTO W/O MICRO
Bilirubin, Urine, POC: NEGATIVE
Glucose, Urine, POC: NEGATIVE
Nitrite, Urine, POC: NEGATIVE
Protein, Urine, POC: NEGATIVE
Specific Gravity, Urine, POC: 1.025 (ref 1.001–1.035)
Urobilinogen, POC: 0.2
pH, Urine, POC: 7 (ref 4.6–8.0)

## 2022-11-08 NOTE — Progress Notes (Signed)
Chloe Lawrence (DOB:  1981-08-31) is a 42 y.o. female,New patient, here for evaluation of the following chief complaint(s):  Urinary Frequency (BEEN ABOUT  3 months,  discharge,  frequency,  pain and pressure  frequency ) and Joint Swelling (About 10 days ago   swelling,   left side ankle,, work on feet a lot   ,   swelling and hurts to walk or stand for long periods of time )         ASSESSMENT/PLAN:  1. Cystitis  -     Culture, Urine; Future  -     nitrofurantoin, macrocrystal-monohydrate, (MACROBID) 100 MG capsule; Take 1 capsule by mouth 2 times daily for 5 days, Disp-10 capsule, R-0Normal  -     AMB POC URINE PREGNANCY TEST, VISUAL COLOR COMPARISON  2. Frequency of urination  -     POC Urinalysis, Auto W/O Scope (81003)  3. Peripheral edema      Return if symptoms worsen or fail to improve.  Follow up with your primary doctor as needed, go to the ER if symptoms worsen      Positive blood on the urine.  Her symptoms sound like UTI.  I told her to start antibiotics, Macrobid.  Culture urine.    I have also instructed her to elevate her legs is much as possible.  Wear support hose at work.  Follow-up with regular doctor if not improving.    Subjective   SUBJECTIVE/OBJECTIVE:  42 year old female with dysuria, frequency for the last several days.  She has had similar symptoms on and off for a month or so but it sounds like they improve and then come back.  No fever, vomiting, back pain.  She also reports ankle swelling, left greater than right.  She does work on her feet a lot and it seems to be improved when she is elevating.  No chest pain or short of breath    Review of Systems   Constitutional:  Negative for chills and fever.   Cardiovascular:  Positive for leg swelling.   Genitourinary:  Positive for dysuria and frequency.   All other systems reviewed and are negative.           Objective   Physical Exam  Vitals and nursing note reviewed.   Constitutional:       General: She is not in acute distress.      Appearance: Normal appearance.   HENT:      Mouth/Throat:      Pharynx: Oropharynx is clear. No oropharyngeal exudate or posterior oropharyngeal erythema.   Cardiovascular:      Rate and Rhythm: Normal rate and regular rhythm.   Pulmonary:      Breath sounds: Normal breath sounds.   Musculoskeletal:         General: Normal range of motion.      Cervical back: Normal range of motion.      Right lower leg: Edema present.      Left lower leg: Edema present.      Comments: Swelling around both ankles, left is worse than right.  Calves are soft, nontender.   Skin:     General: Skin is warm and dry.   Neurological:      General: No focal deficit present.      Mental Status: She is alert and oriented to person, place, and time.                  An electronic signature  was used to authenticate this note.    --Uvaldo Rising, MD

## 2022-11-08 NOTE — Progress Notes (Signed)
Urine culture sent to lab

## 2022-11-08 NOTE — Patient Instructions (Signed)
Ask at pharmacy or look on [facility] for TED hose          Follow up with your primary doctor as needed, go to the ER if symptoms worsen!

## 2022-11-09 LAB — AMB POC URINE PREGNANCY TEST, VISUAL COLOR COMPARISON: HCG, Pregnancy, Urine, POC: NEGATIVE

## 2022-11-09 MED ORDER — NITROFURANTOIN MONOHYD MACRO 100 MG PO CAPS
100 MG | ORAL_CAPSULE | Freq: Two times a day (BID) | ORAL | 0 refills | Status: AC
Start: 2022-11-09 — End: 2022-11-13

## 2022-11-10 LAB — CULTURE, URINE: FINAL REPORT: >100000

## 2022-11-10 NOTE — Other (Signed)
Call and left message for pt to call back

## 2023-09-21 NOTE — Progress Notes (Deleted)
 Chloe Lawrence (DOB:  1981/09/15) is a 42 y.o. female here for evaluation of the following chief complaint(s):  No chief complaint on file.    Assessment and Plan   1. Well adult exam  Discussed age-appropriate screening tests, vaccines, updated planni

## 2024-05-04 NOTE — Care Coordination-Inpatient (Signed)
 Ambulatory Care Coordination Note     05/04/2024 2:24 PM    Received referral from [facility] care management requesting assistance with helping patient to schedule with a [facility] primary care provider to establish care.    Called patient and merged with call center to schedule appt.    Patient scheduled appt for herself and her spouse.    Wyline Pouch, RN  Ambulatory RN Care Manager  [phone]

## 2024-08-02 ENCOUNTER — Ambulatory Visit
Admit: 2024-08-02 | Discharge: 2024-08-02 | Payer: BLUE CROSS/BLUE SHIELD | Attending: Student in an Organized Health Care Education/Training Program

## 2024-08-02 VITALS — BP 128/66 | HR 74 | Temp 98.10000°F | Ht 66.0 in | Wt 182.9 lb

## 2024-08-02 DIAGNOSIS — Z114 Encounter for screening for human immunodeficiency virus [HIV]: Principal | ICD-10-CM

## 2024-08-02 MED ORDER — DICLOFENAC SODIUM 1 % EX GEL
1 | Freq: Four times a day (QID) | CUTANEOUS | 1 refills | 12.00000 days | Status: AC | PRN
Start: 2024-08-02 — End: ?
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
## 2024-08-02 NOTE — Patient Instructions (Signed)
 Apply a compressive ACE bandage. Rest and elevate the affected painful area.  Apply cold compresses intermittently as needed.  As pain recedes, begin normal activities slowly as tolerated.  Call if symptoms persist.

## 2024-08-03 ENCOUNTER — Ambulatory Visit: Admit: 2024-08-03 | Discharge: 2024-08-03 | Payer: BLUE CROSS/BLUE SHIELD

## 2024-08-03 ENCOUNTER — Encounter

## 2024-08-03 ENCOUNTER — Telehealth

## 2024-08-03 DIAGNOSIS — M25521 Pain in right elbow: Principal | ICD-10-CM

## 2024-08-03 LAB — CBC WITH AUTO DIFFERENTIAL
Basophils %: 1.2 % (ref 0.0–2.0)
Basophils Absolute: 0.1 x10e3/mcL (ref 0.0–0.2)
Eosinophils %: 4.7 % (ref 0.0–7.0)
Eosinophils Absolute: 0.2 x10e3/mcL (ref 0.0–0.5)
Hematocrit: 39.8 % (ref 34.0–47.0)
Hemoglobin: 12.7 g/dL (ref 11.5–15.7)
Immature Grans (Abs): 0.01 x10e3/mcL (ref 0.00–0.06)
Immature Granulocytes %: 0.2 % (ref 0.0–0.6)
Lymphocytes Absolute: 1.8 x10e3/mcL (ref 1.0–3.2)
Lymphocytes: 41.5 % (ref 15.0–45.0)
MCH: 28 pg (ref 27.0–34.5)
MCHC: 31.9 g/dL (ref 30.0–36.0)
MCV: 87.9 fL (ref 81.0–99.0)
MPV: 11.7 fL (ref 7.0–12.2)
Monocytes %: 7.5 % (ref 4.0–12.0)
Monocytes Absolute: 0.3 x10e3/mcL (ref 0.3–1.0)
NRBC Absolute: 0 x10e3/mcL (ref 0.000–0.012)
NRBC Automated: 0 % (ref 0.0–0.2)
Neutrophils %: 44.9 % (ref 42.0–74.0)
Neutrophils Absolute: 1.9 x10e3/mcL (ref 1.6–7.3)
Platelets: 246 x10e3/mcL (ref 140–440)
RBC: 4.53 x10e6/mcL (ref 3.60–5.20)
RDW: 15.5 % (ref 10.0–17.0)
WBC: 4.3 x10e3/mcL (ref 3.8–10.6)

## 2024-08-03 LAB — HEMOGLOBIN A1C
Estimated Avg Glucose: 117
Estimated Avg Glucose: 126
Hemoglobin A1C: 5.7 % (ref 4.0–6.0)

## 2024-08-03 LAB — VITAMIN D 25 HYDROXY: Vit D, 25-Hydroxy: 13.8 ng/mL — ABNORMAL LOW (ref 30.0–90.0)

## 2024-08-03 LAB — COMPREHENSIVE METABOLIC PANEL
ALT: 16 U/L (ref 0–42)
AST: 21 U/L (ref 0–46)
Albumin/Globulin Ratio: 1.4 (ref 1.00–2.70)
Albumin: 4.3 g/dL (ref 3.5–5.2)
Alk Phosphatase: 80 U/L (ref 35–117)
Anion Gap: 9 mmol/L (ref 2–17)
BUN: 13 mg/dL (ref 6–20)
CO2: 25 mmol/L (ref 22–29)
Calcium: 9.4 mg/dL (ref 8.5–10.7)
Chloride: 107 mmol/L (ref 98–107)
Creatinine: 0.7 mg/dL (ref 0.5–1.0)
Est, Glom Filt Rate: 109 mL/min/1.73m (ref >=60)
Globulin: 3 g/dL (ref 1.9–4.4)
Glucose: 82 mg/dL (ref 70–99)
Osmolaliy Calculated: 280 mosm/kg (ref 270–287)
Potassium: 4.2 mmol/L (ref 3.5–5.3)
Sodium: 141 mmol/L (ref 135–145)
Total Bilirubin: 0.45 mg/dL (ref 0.00–1.20)
Total Protein: 7.3 g/dL (ref 5.7–8.3)

## 2024-08-03 LAB — HEPATITIS C ANTIBODY: Hepatitis C Ab: NEGATIVE

## 2024-08-03 LAB — LIPID PANEL
Chol/HDL Ratio: 2.6 (ref 0.0–4.4)
Cholesterol, Total: 176 mg/dL (ref 100–200)
HDL: 68 mg/dL (ref >=50)
LDL Cholesterol: 99 mg/dL (ref 0.0–100.0)
LDL/HDL Ratio: 1.5
Triglycerides: 45 mg/dL (ref 0–149)
VLDL: 9 mg/dL (ref 5.0–40.0)

## 2024-08-03 LAB — TSH REFLEX TO FT4: TSH: 3.14 u[IU]/mL (ref 0.358–3.740)

## 2024-08-03 LAB — VITAMIN B12: Vitamin B-12: 622 pg/mL (ref 232–1245)

## 2024-08-03 MED ORDER — VITAMIN D (ERGOCALCIFEROL) 1.25 MG (50000 UT) PO CAPS
1.25 | ORAL_CAPSULE | ORAL | 1 refills | 84.00000 days | Status: AC
Start: 2024-08-03 — End: ?

## 2024-08-03 NOTE — Telephone Encounter (Signed)
 Spoke to patient via phone call and reviewed x-ray results of elbow and spine.  Patient is open to referral to spine intake center.  Vitamin D levels are very low with high-dose vitamin D ordered.  Patient understands to take once weekly.

## 2024-08-07 LAB — VITAMIN C: Vitamin C: 0.8 mg/dL (ref 0.4–2.0)

## 2024-09-13 ENCOUNTER — Ambulatory Visit
Admit: 2024-09-13 | Discharge: 2024-09-13 | Payer: BLUE CROSS/BLUE SHIELD | Attending: Student in an Organized Health Care Education/Training Program

## 2024-09-13 VITALS — BP 136/74 | HR 68 | Temp 97.20000°F | Ht 66.0 in | Wt 180.2 lb

## 2024-09-13 DIAGNOSIS — G8929 Other chronic pain: Principal | ICD-10-CM

## 2024-09-13 NOTE — Progress Notes (Signed)
 "Date of Service: 09/13/2024  Patient: Chloe Lawrence  DOB: 1981/10/24  Chief Complaint   Patient presents with    Follow-up    Discuss Labs    Shoulder Pain     Right        Patient Care Team:  Roseann Laymon BRAVO, DO as PCP - General (Internal Medicine)  Roseann Laymon BRAVO, DO as PCP - Empaneled Provider  Katrinka Wanda CROME, MD as Obstetrician (Obstetrics & Gynecology)    History of Present Illness:  Chloe Lawrence a 43 y.o. female presents today for evaluation of the following issues:  Lab review:  CBC stable CMP stable, TSH stable 3.14, lipid stable with LDL 99, B12 622, vitamin C 0.8  Vitamin D  deficiency:  13.8.  Patient ordered high-dose vitamin D  on 08/03/2024  Prediabetes:  A1c 5.7  Right shoulder pain follow-up:  08/02/2024 visit note:Started 5 days ago. 10/10 pain, constant. Worse with flexing lower arm. Denies trauma/injuries. Admits to recent travel and carrying a suitcase and book bag. Is also carrying her baby in his car seat today but using left arm mostly.     08/03/2024 x-ray of elbow showed no elbow joint effusion, fracture, dislocation.    09/13/2024  Elbow pain is worse. Still carries baby carrier on right side, which exacerbates pain. Ice is helpful.   Open to referral to PT and ortho.     Patient referred to spine center on 08/07/2024 after review of x-ray of neck  Cervical spine x-ray results:  There is reversal of the normal cervical lordosis with multilevel disc space  narrowing. Osteophyte formation is present. There is facet and uncovertebral  hypertrophy. Soft tissues are intact. There is no fracture ...        Degenerative spondylosis cervical spine.       Vaginal numbness:  States she is unable to feel penetration during vaginal intercourse. Has been this way her entire 105 year marriage. Is able to derive climax from clitoral stimulation. Would like a further work up. Discussed referral to pelvic floor specialist.   Current Outpatient Medications   Medication Sig Dispense Refill     vitamin D  (ERGOCALCIFEROL ) 1.25 MG (50000 UT) CAPS capsule Take 1 capsule by mouth once a week 12 capsule 1    Levonorgestrel (KYLEENA) IUD 19.5 mg 1 each by IntraUTERine route once      diclofenac  sodium (VOLTAREN ) 1 % GEL Apply 4 g topically 4 times daily as needed for Pain 150 g 1     No current facility-administered medications for this visit.       No Known Allergies  There is no problem list on file for this patient.    History reviewed. No pertinent past medical history.  Past Surgical History:   Procedure Laterality Date    CESAREAN SECTION       Family History   Problem Relation Age of Onset    Diabetes Mother 63 - 72    No Known Problems Father     No Known Problems Sister     No Known Problems Son     No Known Problems Son     No Known Problems Son     No Known Problems Daughter     No Known Problems Daughter      Social History     Tobacco Use    Smoking status: Never    Smokeless tobacco: Never   Substance Use Topics    Alcohol use: Never  Social History     Social History Narrative    Not on file         ROS:    Review of Systems    See HPI, all other ROS are negative         BP 136/74   Pulse 68   Temp 97.2 F (36.2 C)   Ht 1.676 m (5' 6)   Wt 81.7 kg (180 lb 3.2 oz)   SpO2 97%   BMI 29.09 kg/m     General: conversive, pleasant, awake, alert   HEENT:conjunctiva normal, Neck supple, no lymphadenopathy, mucus membranes moist, no thyromegaly  Respiratory: breath sounds clear throughout, non-labored breathing  Cardiovascular: RRR, no MGR, no carotid bruits, radial and pedal pulses, no edema  GI: non-distended  Neuro: A/O x4, speech normal, CN II-XII intact, no focal motor or sensory deficits  MSK: Full ROM in BUE and BLE  Skin: no rashes, warm, dry  Psychiatric: cooperative  Reproductive: deferred     ASSESSMENT and PLAN:  1. Chronic neck pain    - Ambulatory referral to Orthopedic Surgery  - [facility] [place], [address]    2. Right elbow pain    - Ambulatory  referral to Orthopedic Surgery  - [facility] [place], [address]    3. Right elbow tendinitis    - [facility] [place] [address]    4. Lack of sensation    - [facility] Rec[facility], Urogynecology, [facility]       Orders Placed This Encounter   Procedures    Ambulatory referral to Orthopedic Surgery     Referral Priority:   Routine     Referral Type:   Eval and Treat     Referral Reason:   Specialty Services Required     Referred to Provider:   Floretta Lamar DASEN, MD     Requested Specialty:   Orthopedic Surgery     Number of Visits Requested:   1    [facility] [address]     Referral Priority:   Routine     Referral Type:   Eval and Treat     Referral Reason:   Specialty Services Required     Referral Location:   PT/OT, [facility]     Requested Specialty:   Physical Therapy     Number of Visits Requested:   1    [facility], Urogynecology, [facility]     Referral Priority:   Routine     Referral Type:   Eval and Treat     Referral Reason:   Specialty Services Required     Referred to Provider:   [facility]     Requested Specialty:   Urology     Number of Visits Requested:   1       Return in about 11 months (around 08/13/2025) for annual physical.       "

## 2024-10-06 ENCOUNTER — Ambulatory Visit
Admit: 2024-10-06 | Discharge: 2024-10-06 | Payer: BLUE CROSS/BLUE SHIELD | Attending: Student in an Organized Health Care Education/Training Program

## 2024-10-06 DIAGNOSIS — G8929 Other chronic pain: Principal | ICD-10-CM

## 2024-10-06 DIAGNOSIS — M7711 Lateral epicondylitis, right elbow: Secondary | ICD-10-CM

## 2024-10-06 MED ORDER — TIZANIDINE HCL 2 MG PO TABS
2 | ORAL_TABLET | Freq: Every evening | ORAL | 0 refills | 15.00000 days | Status: AC | PRN
Start: 2024-10-06 — End: ?

## 2024-10-06 MED ORDER — DICLOFENAC SODIUM 1 % EX GEL
1 | Freq: Four times a day (QID) | CUTANEOUS | 2 refills | 15.00000 days | Status: AC
Start: 2024-10-06 — End: ?
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
## 2024-10-19 NOTE — Telephone Encounter (Signed)
"  Called to confirm appt 10/20/2024  No answer   "

## 2024-10-20 ENCOUNTER — Encounter: Payer: BLUE CROSS/BLUE SHIELD | Attending: Obstetrics & Gynecology

## 2024-11-03 ENCOUNTER — Encounter: Payer: BLUE CROSS/BLUE SHIELD | Attending: Student in an Organized Health Care Education/Training Program
